# Patient Record
Sex: Male | Born: 1940 | Race: White | Hispanic: No | Marital: Married | Smoking: Former smoker
Health system: Southern US, Community
[De-identification: ages and names within clinical notes are randomized; demographics above are authoritative.]

## PROBLEM LIST (undated history)

## (undated) DIAGNOSIS — G4730 Sleep apnea, unspecified: Secondary | ICD-10-CM

## (undated) DIAGNOSIS — G4733 Obstructive sleep apnea (adult) (pediatric): Secondary | ICD-10-CM

## (undated) DIAGNOSIS — I214 Non-ST elevation (NSTEMI) myocardial infarction: Secondary | ICD-10-CM

## (undated) DIAGNOSIS — I2510 Atherosclerotic heart disease of native coronary artery without angina pectoris: Secondary | ICD-10-CM

## (undated) DIAGNOSIS — I209 Angina pectoris, unspecified: Secondary | ICD-10-CM

## (undated) DIAGNOSIS — R0600 Dyspnea, unspecified: Secondary | ICD-10-CM

## (undated) DIAGNOSIS — M1990 Unspecified osteoarthritis, unspecified site: Secondary | ICD-10-CM

## (undated) DIAGNOSIS — E785 Hyperlipidemia, unspecified: Secondary | ICD-10-CM

## (undated) DIAGNOSIS — F5101 Primary insomnia: Secondary | ICD-10-CM

## (undated) DIAGNOSIS — R1013 Epigastric pain: Secondary | ICD-10-CM

## (undated) DIAGNOSIS — M545 Low back pain: Secondary | ICD-10-CM

## (undated) DIAGNOSIS — M549 Dorsalgia, unspecified: Secondary | ICD-10-CM

## (undated) DIAGNOSIS — E669 Obesity, unspecified: Secondary | ICD-10-CM

## (undated) DIAGNOSIS — Z8719 Personal history of other diseases of the digestive system: Secondary | ICD-10-CM

## (undated) DIAGNOSIS — K469 Unspecified abdominal hernia without obstruction or gangrene: Secondary | ICD-10-CM

## (undated) DIAGNOSIS — E7800 Pure hypercholesterolemia, unspecified: Secondary | ICD-10-CM

## (undated) DIAGNOSIS — K219 Gastro-esophageal reflux disease without esophagitis: Secondary | ICD-10-CM

## (undated) DIAGNOSIS — N179 Acute kidney failure, unspecified: Secondary | ICD-10-CM

## (undated) DIAGNOSIS — R931 Abnormal findings on diagnostic imaging of heart and coronary circulation: Secondary | ICD-10-CM

## (undated) DIAGNOSIS — S37009A Unspecified injury of unspecified kidney, initial encounter: Secondary | ICD-10-CM

## (undated) DIAGNOSIS — Z9989 Dependence on other enabling machines and devices: Secondary | ICD-10-CM

## (undated) DIAGNOSIS — G8929 Other chronic pain: Secondary | ICD-10-CM

## (undated) DIAGNOSIS — I10 Essential (primary) hypertension: Secondary | ICD-10-CM

## (undated) DIAGNOSIS — M791 Myalgia, unspecified site: Secondary | ICD-10-CM

## (undated) HISTORY — DX: Epigastric pain: R10.13

## (undated) HISTORY — PX: KNEE ARTHROSCOPY: SUR90

## (undated) HISTORY — DX: Myalgia, unspecified site: M79.10

## (undated) HISTORY — DX: Unspecified osteoarthritis, unspecified site: M19.90

## (undated) HISTORY — DX: Non-ST elevation (NSTEMI) myocardial infarction: I21.4

## (undated) HISTORY — DX: Primary insomnia: F51.01

## (undated) HISTORY — DX: Obstructive sleep apnea (adult) (pediatric): G47.33

## (undated) HISTORY — DX: Obesity, unspecified: E66.9

## (undated) HISTORY — PX: CARPAL TUNNEL RELEASE: SHX101

## (undated) HISTORY — DX: Atherosclerotic heart disease of native coronary artery without angina pectoris: I25.10

## (undated) HISTORY — DX: Low back pain: M54.5

## (undated) HISTORY — DX: Unspecified injury of unspecified kidney, initial encounter: S37.009A

## (undated) HISTORY — PX: APPENDECTOMY: SHX54

## (undated) HISTORY — DX: Pure hypercholesterolemia, unspecified: E78.00

## (undated) HISTORY — DX: Essential (primary) hypertension: I10

## (undated) HISTORY — DX: Other chronic pain: G89.29

## (undated) HISTORY — DX: Sleep apnea, unspecified: G47.30

## (undated) HISTORY — DX: Acute kidney failure, unspecified: N17.9

---

## 2004-03-20 ENCOUNTER — Ambulatory Visit (HOSPITAL_BASED_OUTPATIENT_CLINIC_OR_DEPARTMENT_OTHER): Admission: RE | Admit: 2004-03-20 | Discharge: 2004-03-20 | Payer: Self-pay | Admitting: Orthopedic Surgery

## 2004-03-20 ENCOUNTER — Ambulatory Visit (HOSPITAL_COMMUNITY): Admission: RE | Admit: 2004-03-20 | Discharge: 2004-03-20 | Payer: Self-pay | Admitting: Orthopedic Surgery

## 2004-07-11 ENCOUNTER — Ambulatory Visit (HOSPITAL_BASED_OUTPATIENT_CLINIC_OR_DEPARTMENT_OTHER): Admission: RE | Admit: 2004-07-11 | Discharge: 2004-07-11 | Payer: Self-pay | Admitting: Internal Medicine

## 2004-07-11 ENCOUNTER — Encounter: Payer: Self-pay | Admitting: Internal Medicine

## 2005-05-31 ENCOUNTER — Encounter (INDEPENDENT_AMBULATORY_CARE_PROVIDER_SITE_OTHER): Payer: Self-pay | Admitting: Specialist

## 2005-05-31 ENCOUNTER — Ambulatory Visit (HOSPITAL_COMMUNITY): Admission: RE | Admit: 2005-05-31 | Discharge: 2005-05-31 | Payer: Self-pay | Admitting: *Deleted

## 2006-05-03 ENCOUNTER — Encounter: Admission: RE | Admit: 2006-05-03 | Discharge: 2006-05-03 | Payer: Self-pay | Admitting: Orthopedic Surgery

## 2006-06-17 ENCOUNTER — Encounter: Admission: RE | Admit: 2006-06-17 | Discharge: 2006-06-17 | Payer: Self-pay | Admitting: Orthopedic Surgery

## 2007-03-20 ENCOUNTER — Encounter: Admission: RE | Admit: 2007-03-20 | Discharge: 2007-03-20 | Payer: Self-pay | Admitting: Orthopedic Surgery

## 2007-04-02 ENCOUNTER — Encounter: Admission: RE | Admit: 2007-04-02 | Discharge: 2007-04-02 | Payer: Self-pay | Admitting: Orthopedic Surgery

## 2007-08-27 ENCOUNTER — Encounter: Admission: RE | Admit: 2007-08-27 | Discharge: 2007-08-27 | Payer: Self-pay | Admitting: Orthopedic Surgery

## 2008-06-17 ENCOUNTER — Encounter: Admission: RE | Admit: 2008-06-17 | Discharge: 2008-06-17 | Payer: Self-pay | Admitting: Orthopedic Surgery

## 2008-06-30 ENCOUNTER — Encounter: Admission: RE | Admit: 2008-06-30 | Discharge: 2008-06-30 | Payer: Self-pay | Admitting: Orthopedic Surgery

## 2008-11-17 ENCOUNTER — Encounter: Admission: RE | Admit: 2008-11-17 | Discharge: 2008-11-17 | Payer: Self-pay | Admitting: Orthopedic Surgery

## 2009-02-24 ENCOUNTER — Ambulatory Visit: Payer: Self-pay | Admitting: Internal Medicine

## 2009-02-24 DIAGNOSIS — G4730 Sleep apnea, unspecified: Secondary | ICD-10-CM

## 2009-02-24 HISTORY — DX: Sleep apnea, unspecified: G47.30

## 2009-08-31 ENCOUNTER — Encounter: Admission: RE | Admit: 2009-08-31 | Discharge: 2009-08-31 | Payer: Self-pay | Admitting: Orthopedic Surgery

## 2009-10-18 ENCOUNTER — Ambulatory Visit: Payer: Self-pay | Admitting: Internal Medicine

## 2009-10-18 DIAGNOSIS — R490 Dysphonia: Secondary | ICD-10-CM | POA: Insufficient documentation

## 2010-07-03 ENCOUNTER — Encounter: Admission: RE | Admit: 2010-07-03 | Discharge: 2010-07-03 | Payer: Self-pay | Admitting: Orthopedic Surgery

## 2010-12-17 ENCOUNTER — Encounter: Payer: Self-pay | Admitting: Orthopedic Surgery

## 2010-12-26 NOTE — Assessment & Plan Note (Signed)
Summary: sleep ap/former pt/trouble w/machine/apc   Primary Provider/Referring Provider:  Urgent Care  CC:  sleep new pt-former pt at [place] [facility]; having trouble with CPAP machine.Allen James  History of Present Illness: Current Problems: 34/1/10- 70 yoM formerly dx'x with sleep apnea. NPSG 07/11/04- RDI 15/hr. CPAP was adjusted to 9cwp. He has been very comforable with that. Machine is now wearing out and he needs replacement. He is not told he snores and he denies daytime sleepiness. He has been very compliant, using cpap every night.  Otherwise health has been good without routine medical problems. Bedtime 930- 1030 PM, sleep latency 20 min, waking 1-3 times before up at 7-730 AM. Weight up 35 lbs in 2 years     Preventive Screening-Counseling & Management     Smoking Status: quit  Current Medications (verified): 1)  Aleve 220 Mg Caps (Naproxen Sodium) .... Take As Directed As Needed Arthritis 2)  Cpap .... [facility]  Allergies (verified): No Known Drug Allergies  Past History:  Family History:    Mother0- died heart disease    Father -died COPD    Brother- had sleep apnea, died of a lung disorder- worked with benzene (02/24/2009)  Social History:    Patient states former smoker. 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  Past Medical History:    Sleep Apnea  Past Surgical History:    Right knee surgery    Bilateral carpal tunnel  Family History:    Reviewed history and no changes required:       Mother- died  heart disease       Father -died COPD       Brother- had sleep apnea, died of a lung disorder- worked with benzene  Social History:    Reviewed history and no changes required:       Patient states former smoker. 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  The patient denies shortness of breath at rest, productive cough, non-productive cough, coughing up blood, chest pain, irregular heartbeats, indigestion, loss of appetite, weight change, abdominal pain, difficulty swallowing, sore throat, tooth/dental problems, headaches, nasal congestion/difficulty breathing through nose, sneezing, itching, ear ache, anxiety, depression, hand/feet swelling, joint stiffness or pain, rash, change in color of mucus, and fever.    Vital Signs:  Patient profile:   70 year old male Height:      73 inches Weight:      277.38 pounds BMI:     36.73 Pulse rate:   75 / minute BP sitting:   108 / 56  (left arm) Cuff size:   large  Vitals Entered By: Reynaldo Minium CMA (February 24, 2009 4:01 PM)  O2 Sat on room air at rest %:  98 CC: sleep new pt-former pt at [place] [facility]; having trouble with CPAP machine. Comments Medications reviewed with patient Reynaldo Minium CMA  February 24, 2009 4:03 PM    Physical Exam  Additional Exam:  General: A/Ox3; pleasant and cooperative, NAD, overweight SKIN: no rash, lesions NODES: no lymphadenopathy HEENT: /AT, EOM- WNL, Conjuctivae- clear, PERRLA, TM-WNL, Nose- clear, Throat- clear and wnl, Melampatti II-III NECK: Supple w/ fair ROM, JVD- none, normal carotid impulses w/o bruits Thyroid- normal to palpation CHEST: Clear to P&A HEART: RRR, no m/g/r heard ABDOMEN: Soft and nl; nml bowel sounds; no organomegaly or masses noted AVW:UJWJ, nl pulses, no edema  NEURO: Grossly intact to observation      Impression &  Recommendations:  Problem # 1:  SLEEP APNEA (ICD-780.57)  Excellent control and compliance. Would help to lose weight. We will continue cpap at 9 and get replacement for worn out machnie.  Complete Medication List: 1)  Aleve 220 Mg Caps (Naproxen sodium) .... Take as directed as needed arthritis 2)  Cpap 9  .... [facility]  Other Orders: New Patient Level III (19147) DME Referral (DME)  Patient Instructions: 1)  Please schedule a follow-up appointment in 1 year. 2)  We will send cpap replacement info to [facility] and continue pressure a 9 cwp.

## 2010-12-26 NOTE — Assessment & Plan Note (Signed)
Summary: ROV//MBW   Primary Provider/Referring Provider:  Urgent Care  CC:  follow up visit-"hot flashes" and tightness in throat and losing voice x 6 weeks. Allen James  History of Present Illness: Current Problems: 34/1/10- 67 yoM formerly dx'x with sleep apnea. NPSG 07/11/04- RDI 15/hr. CPAP was adjusted to 9cwp. He has been very comforable with that. Machine is now wearing out and he needs replacement. He is not told he snores and he denies daytime sleepiness. He has been very compliant, using cpap every night.  Otherwise health has been good without routine medical problems. Bedtime 930- 1030 PM, sleep latency 20 min, waking 1-3 times before up at 7-730 AM. Weight up 35 lbs in 2 years  October 18, 2009- OSA Doing well since CPAP machine replaced- set on 9 ([facility]). Machine seems fine. Now in past 5-6weeks wife concerned about his change of voice. Raspy and feels tight in throat without postnasal drip. Hot sweaty spells. No fever. No dysphagia  or sore throat. Can't clear throat. Chest feels ok. Heartburn is controlled with otc pills.    Current Medications (verified): 1)  Aleve 220 Mg Caps (Naproxen Sodium) .... Take As Directed As Needed Arthritis 2)  Cpap 9 .... [facility]  Allergies (verified): No Known Drug Allergies  Past History:  Past Medical History: Last updated: 03-15-2009 Sleep Apnea  Past Surgical History: Last updated: 03-15-2009 Right knee surgery Bilateral carpal tunnel  Family History: Last updated: 2009-03-15 Mother- died heart disease Father -died COPD Brother- had sleep apnea, died of a lung disorder- worked with benzene  Social History: Last updated: 03-15-2009 Patient states former smoker. 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  The patient denies anorexia, fever, weight loss, weight gain, vision loss, decreased  hearing, chest pain, syncope, dyspnea on exertion, peripheral edema, prolonged cough, headaches, hemoptysis, abdominal pain, and severe indigestion/heartburn.    Vital Signs:  Patient profile:   70 year old male Height:      73 inches Weight:      264.50 pounds BMI:     35.02 O2 Sat:      97 % on Room air Pulse rate:   87 / minute BP sitting:   130 / 72  (left arm) Cuff size:   regular  Vitals Entered By: Reynaldo Minium CMA (October 18, 2009 3:31 PM)  O2 Flow:  Room air  Physical Exam  Additional Exam:  General: A/Ox3; pleasant and cooperative, NAD, overweight SKIN: no rash, lesions NODES: no lymphadenopathy HEENT: [place]/AT, EOM- WNL, Conjuctivae- clear, PERRLA, TM-WNL, Nose- clear, Throat- clear and wnl, Melampatti II-III, hoarse without stridor NECK: Supple w/ fair ROM, JVD- none, normal carotid impulses w/o bruits Thyroid- normal to palpation CHEST: Clear to P&A HEART: RRR, no m/g/r heard ABDOMEN: Soft and nl;  ZOX:WRUE, nl pulses, no edema  NEURO: Grossly intact to observation      Impression & Recommendations:  Problem # 1:  SLEEP APNEA (ICD-780.57)  Great compliance and control  Problem # 2:  HOARSENESS (ICD-784.42)  Nonspecific hoarseness, persistent x 5 weeks in a former smoker. Need Laryngoscopy . Will set up ENT referral. Consider reflux or indoor heat but he denies obvious waterbrash and he is using cpap humidifier.   Other Orders: Est. Patient Level II (45409) ENT Referral (ENT)  Patient Instructions: 1)  Please schedule a follow-up appointment in 6 months.for  follow up of sleep apnea- earleir if needed 2)  See [facility] for ENT referral

## 2011-04-13 NOTE — Op Note (Signed)
NAME:  Allen James, Allen James                        ACCOUNT NO.:  [id number]   MEDICAL RECORD NO.:  [id number]                   PATIENT TYPE:  AMB   LOCATION:  DSC                                  FACILITY:  [facility]   PHYSICIAN:  John L. Rendall III, M.D.           DATE OF BIRTH:  19-Dec-1940   DATE OF PROCEDURE:  03/20/2004  DATE OF DISCHARGE:                                 OPERATIVE REPORT   PREOPERATIVE DIAGNOSIS:  Torn medial meniscus, left knee.   POSTOPERATIVE DIAGNOSIS:  Complex flap tear lateral meniscus plus  osteoarthritis medial femoral condyle and patella.   OPERATION PERFORMED:  Chondroplasty medial femoral condyle and patella plus  lateral meniscectomy.   SURGEON:  John L. Rendall, M.D.   ANESTHESIA:  MAC.   INDICATIONS FOR PROCEDURE:  Chronic medial left knee pain with crepitation,  grinding and swelling.   JUSTIFICATION FOR OUTPATIENT SETTING:  Inpatient not required and chronic  symptoms are resistant to conservative measures.   DESCRIPTION OF PROCEDURE:  Under MAC anesthesia, the left knee was prepared  with Betadine and draped as a sterile field with a legholder.  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  Using a curved Merlin shaver, a chondroplasty medial femoral condyle was  done.  A fine rasp was used to smooth the remaining fibrillation and then it  was touched up with the shaver.  Medial meniscus was intact and untorn.  In  the lateral compartment however, there was a large flap tear flipped into  the intercondylar notch.  It was resected with a combination of basket  forceps and intra-articular shaver and the meniscal rim was carefully  trimmed.  Local chondromalacia on the femoral condyle was shaved.  The  remainder of the knee was then carefully examined.  Some pieces  of floating  hyaline cartilage were washed out of the knee. The knee was then infiltrated  with Marcaine with morphine with epinephrine and a sterile compressive  bandage was applied.  The patient returned to recovery in good condition.                                               John L. Dorothyann Gibbs, M.D.    Renato Gails  D:  03/20/2004  T:  03/20/2004  Job:  [id number]

## 2011-04-13 NOTE — Procedures (Signed)
NAME:  Allen James, Allen James            ACCOUNT NO.:  [id number]   MEDICAL RECORD NO.:  [id number]          PATIENT TYPE:  OUT   LOCATION:  SLEEP CENTER                 [facility]ACILITY[facility]   PHYSICIAN:  Clinton D. Maple Hudson, M.D. DATE OF BIRTH:  12/20/1940   DATE OF ADMISSION:  07/11/2004  DATE OF DISCHARGE:  07/11/2004                              NOCTURNAL POLYSOMNOGRAM   REFERRING PHYSICIAN:  Clinton D. Maple Hudson, M.D.   INDICATION FOR STUDY AND HISTORY:  1. Hypersomnia with sleep apnea.   Epworth sleepiness score 154, BMI 35, weight 260 pounds.   MEDICATIONS:  Advil, Maalox.   SLEEP ARCHITECTURE:  Short total sleep time 281 minutes with sleep  efficiency of 53%.  Stage I was 21%; stage II, 59%; Stages III and IV 1%.  REM was 19% of total sleep time.  Latency to sleep onset 59 minutes, latency  to REM 211 minutes.  Awake after sleep onset 195 minutes.  Arousal index 18  per hour.   RESPIRATORY DATA:  Split study protocol.  Mild obstructive sleep  apnea/hypopnea syndrome, RDI 15 per hour before CPAP.  This included 6  obstructive apneas and 32 hypopneas.  The events were not positional.  CPAP  was titrated to 9 CWP.  Best pressure was actually 5 CWP,  RDI of 0 per  hour, using a standard size Fisher Paykel Acclaim 407 mask with heated  humidifier.   OXYGEN DATA:  Moderate snoring with mild oxygen desaturation to 85%.with  apneas, corrected to 92% on CPAP, breathing room air.  Baseline room air  oxygen saturation while awake was 92%.   CARDIAC DATA:  Normal sinus rhythm.   MOVEMENT AND PARASOMNIA:  There were 32 limb jerks recorded of which 14 were  associated with arousal or awakening for a periodic limb movement with  arousal index of 3 per hour.   IMPRESSION AND RECOMMENDATIONS:  Mild obstructive sleep apnea/hypopnea  syndrome, RDI 15 per hour/  1. Adequate control; with CPAP 5 CWP, RDI 0.  2. Mild oxygen desaturation corrected by CPAP.    Clinton D. Maple Hudson, M.D.  Diplomate,  Biomedical engineer of Sleep Medicine                                   ______________________________                                Rennis Chris. Maple Hudson, M.D.                                Diplomate, American Board of Sleep Medicine    CDY/MEDQ  D:  07/16/2004 11:59:10  T:  07/16/2004 23:45:21  Job:  [id number]

## 2011-05-11 ENCOUNTER — Other Ambulatory Visit: Payer: Self-pay | Admitting: Family Medicine

## 2011-05-11 DIAGNOSIS — M545 Low back pain, unspecified: Secondary | ICD-10-CM

## 2011-05-18 ENCOUNTER — Ambulatory Visit
Admission: RE | Admit: 2011-05-18 | Discharge: 2011-05-18 | Disposition: A | Discharge status: Home / Self Care | Payer: Medicare Other | Source: Ambulatory Visit | Attending: Family Medicine | Admitting: Family Medicine

## 2011-05-18 DIAGNOSIS — M545 Low back pain, unspecified: Secondary | ICD-10-CM

## 2014-04-12 ENCOUNTER — Emergency Department (HOSPITAL_COMMUNITY): Payer: Medicare HMO

## 2014-04-12 ENCOUNTER — Inpatient Hospital Stay (HOSPITAL_COMMUNITY)
Admission: EM | Admit: 2014-04-12 | Discharge: 2014-04-15 | DRG: 247 | Disposition: A | Discharge status: Home / Self Care | Payer: Medicare HMO | Attending: Cardiology | Admitting: Cardiology

## 2014-04-12 ENCOUNTER — Encounter (HOSPITAL_COMMUNITY): Payer: Self-pay | Admitting: Emergency Medicine

## 2014-04-12 ENCOUNTER — Inpatient Hospital Stay (HOSPITAL_COMMUNITY): Payer: Medicare HMO

## 2014-04-12 ENCOUNTER — Encounter (HOSPITAL_COMMUNITY)
Admission: EM | Disposition: A | Discharge status: Home / Self Care | Payer: Medicare HMO | Source: Home / Self Care | Attending: Cardiology

## 2014-04-12 ENCOUNTER — Ambulatory Visit (HOSPITAL_COMMUNITY): Admit: 2014-04-12 | Payer: Self-pay | Admitting: Cardiovascular Disease

## 2014-04-12 DIAGNOSIS — Z955 Presence of coronary angioplasty implant and graft: Secondary | ICD-10-CM

## 2014-04-12 DIAGNOSIS — E669 Obesity, unspecified: Secondary | ICD-10-CM | POA: Diagnosis present

## 2014-04-12 DIAGNOSIS — N179 Acute kidney failure, unspecified: Secondary | ICD-10-CM | POA: Diagnosis present

## 2014-04-12 DIAGNOSIS — Z9989 Dependence on other enabling machines and devices: Secondary | ICD-10-CM

## 2014-04-12 DIAGNOSIS — I214 Non-ST elevation (NSTEMI) myocardial infarction: Secondary | ICD-10-CM | POA: Insufficient documentation

## 2014-04-12 DIAGNOSIS — R079 Chest pain, unspecified: Secondary | ICD-10-CM

## 2014-04-12 DIAGNOSIS — E785 Hyperlipidemia, unspecified: Secondary | ICD-10-CM

## 2014-04-12 DIAGNOSIS — G4733 Obstructive sleep apnea (adult) (pediatric): Secondary | ICD-10-CM

## 2014-04-12 DIAGNOSIS — N183 Chronic kidney disease, stage 3 unspecified: Secondary | ICD-10-CM

## 2014-04-12 DIAGNOSIS — I2510 Atherosclerotic heart disease of native coronary artery without angina pectoris: Secondary | ICD-10-CM | POA: Diagnosis present

## 2014-04-12 DIAGNOSIS — Z6833 Body mass index (BMI) 33.0-33.9, adult: Secondary | ICD-10-CM

## 2014-04-12 DIAGNOSIS — Z7982 Long term (current) use of aspirin: Secondary | ICD-10-CM

## 2014-04-12 HISTORY — DX: Hyperlipidemia, unspecified: E78.5

## 2014-04-12 HISTORY — DX: Obstructive sleep apnea (adult) (pediatric): G47.33

## 2014-04-12 HISTORY — DX: Dorsalgia, unspecified: M54.9

## 2014-04-12 HISTORY — PX: LEFT HEART CATHETERIZATION WITH CORONARY ANGIOGRAM: SHX5451

## 2014-04-12 HISTORY — DX: Dependence on other enabling machines and devices: Z99.89

## 2014-04-12 HISTORY — DX: Gastro-esophageal reflux disease without esophagitis: K21.9

## 2014-04-12 HISTORY — DX: Atherosclerotic heart disease of native coronary artery without angina pectoris: I25.10

## 2014-04-12 HISTORY — DX: Unspecified osteoarthritis, unspecified site: M19.90

## 2014-04-12 HISTORY — DX: Angina pectoris, unspecified: I20.9

## 2014-04-12 HISTORY — PX: CORONARY ANGIOPLASTY: SHX604

## 2014-04-12 HISTORY — PX: PERCUTANEOUS CORONARY STENT INTERVENTION (PCI-S): SHX5485

## 2014-04-12 HISTORY — DX: Personal history of other diseases of the digestive system: Z87.19

## 2014-04-12 HISTORY — DX: Non-ST elevation (NSTEMI) myocardial infarction: I21.4

## 2014-04-12 HISTORY — DX: Abnormal findings on diagnostic imaging of heart and coronary circulation: R93.1

## 2014-04-12 LAB — I-STAT CHEM 8, ED
BUN: 19 mg/dL (ref 6–23)
Calcium, Ion: 1.14 mmol/L (ref 1.13–1.30)
Chloride: 107 mEq/L (ref 96–112)
Creatinine, Ser: 1.6 mg/dL — ABNORMAL HIGH (ref 0.50–1.35)
Glucose, Bld: 129 mg/dL — ABNORMAL HIGH (ref 70–99)
HCT: 49 % (ref 39.0–52.0)
Hemoglobin: 16.7 g/dL (ref 13.0–17.0)
Potassium: 4.3 mEq/L (ref 3.7–5.3)
Sodium: 142 mEq/L (ref 137–147)
TCO2: 25 mmol/L (ref 0–100)

## 2014-04-12 LAB — POCT ACTIVATED CLOTTING TIME: Activated Clotting Time: 359 seconds

## 2014-04-12 LAB — CBC
HCT: 45.2 % (ref 39.0–52.0)
Hemoglobin: 15.6 g/dL (ref 13.0–17.0)
MCH: 32.7 pg (ref 26.0–34.0)
MCHC: 34.5 g/dL (ref 30.0–36.0)
MCV: 94.8 fL (ref 78.0–100.0)
Platelets: 208 10*3/uL (ref 150–400)
RBC: 4.77 MIL/uL (ref 4.22–5.81)
RDW: 12.5 % (ref 11.5–15.5)
WBC: 6.5 10*3/uL (ref 4.0–10.5)

## 2014-04-12 LAB — TROPONIN I
Troponin I: <0.3 ng/mL (ref <0.30)
Troponin I: 3.48 ng/mL (ref <0.30)

## 2014-04-12 LAB — I-STAT TROPONIN, ED: Troponin i, poc: 0.02 ng/mL (ref 0.00–0.08)

## 2014-04-12 SURGERY — LEFT HEART CATHETERIZATION WITH CORONARY ANGIOGRAM
Anesthesia: LOCAL

## 2014-04-12 MED ORDER — HEPARIN (PORCINE) IN NACL 2-0.9 UNIT/ML-% IJ SOLN
INTRAMUSCULAR | Status: AC
  Filled 2014-04-12: qty 1000

## 2014-04-12 MED ORDER — TICAGRELOR 90 MG PO TABS
90.0000 mg | ORAL_TABLET | Freq: Two times a day (BID) | ORAL | Status: DC
  Administered 2014-04-12 – 2014-04-15 (×6): 90 mg via ORAL
  Filled 2014-04-12 (×8): qty 1

## 2014-04-12 MED ORDER — SODIUM CHLORIDE 0.9 % IJ SOLN: 3.0000 mL | INTRAMUSCULAR | Status: DC | PRN

## 2014-04-12 MED ORDER — SODIUM CHLORIDE 0.9 % IV SOLN: 250.0000 mL | INTRAVENOUS | Status: DC | PRN

## 2014-04-12 MED ORDER — TRAMADOL HCL 50 MG PO TABS
100.0000 mg | ORAL_TABLET | Freq: Three times a day (TID) | ORAL | Status: DC
  Administered 2014-04-12 – 2014-04-15 (×8): 100 mg via ORAL
  Filled 2014-04-12 (×8): qty 2

## 2014-04-12 MED ORDER — HEPARIN SODIUM (PORCINE) 1000 UNIT/ML IJ SOLN
INTRAMUSCULAR | Status: AC
  Filled 2014-04-12: qty 1

## 2014-04-12 MED ORDER — MIDAZOLAM HCL 2 MG/2ML IJ SOLN
INTRAMUSCULAR | Status: AC
Start: 2014-04-12 — End: 2014-04-12
  Filled 2014-04-12: qty 2

## 2014-04-12 MED ORDER — SODIUM CHLORIDE 0.9 % IV SOLN
1.7500 mg/kg/h | INTRAVENOUS | Status: DC
  Administered 2014-04-12: 1.75 mg/kg/h via INTRAVENOUS
  Filled 2014-04-12: qty 250

## 2014-04-12 MED ORDER — TICAGRELOR 90 MG PO TABS
ORAL_TABLET | ORAL | Status: AC
  Filled 2014-04-12: qty 2

## 2014-04-12 MED ORDER — SODIUM CHLORIDE 0.9 % IV SOLN: 1.0000 mL/kg/h | INTRAVENOUS | Status: AC

## 2014-04-12 MED ORDER — ASPIRIN 81 MG PO CHEW
324.0000 mg | CHEWABLE_TABLET | Freq: Once | ORAL | Status: AC
  Administered 2014-04-12: 324 mg via ORAL
  Filled 2014-04-12: qty 4

## 2014-04-12 MED ORDER — MIDAZOLAM HCL 2 MG/2ML IJ SOLN
INTRAMUSCULAR | Status: AC
  Filled 2014-04-12: qty 2

## 2014-04-12 MED ORDER — OXYCODONE-ACETAMINOPHEN 5-325 MG PO TABS: 1.0000 | ORAL_TABLET | ORAL | Status: DC | PRN

## 2014-04-12 MED ORDER — HEPARIN SODIUM (PORCINE) 5000 UNIT/ML IJ SOLN
4000.0000 [IU] | Freq: Once | INTRAMUSCULAR | Status: AC
  Administered 2014-04-12: 4000 [IU] via INTRAVENOUS
  Filled 2014-04-12: qty 1

## 2014-04-12 MED ORDER — VERAPAMIL HCL 2.5 MG/ML IV SOLN
INTRAVENOUS | Status: AC
  Filled 2014-04-12: qty 2

## 2014-04-12 MED ORDER — ASPIRIN 81 MG PO CHEW
81.0000 mg | CHEWABLE_TABLET | Freq: Every day | ORAL | Status: DC
  Administered 2014-04-13 – 2014-04-15 (×3): 81 mg via ORAL
  Filled 2014-04-12 (×3): qty 1

## 2014-04-12 MED ORDER — NITROGLYCERIN 0.4 MG SL SUBL: 0.4000 mg | SUBLINGUAL_TABLET | SUBLINGUAL | Status: DC | PRN

## 2014-04-12 MED ORDER — HEPARIN SODIUM (PORCINE) 5000 UNIT/ML IJ SOLN
5000.0000 [IU] | Freq: Three times a day (TID) | INTRAMUSCULAR | Status: DC
  Administered 2014-04-13 – 2014-04-14 (×4): 5000 [IU] via SUBCUTANEOUS
  Filled 2014-04-12 (×7): qty 1

## 2014-04-12 MED ORDER — MORPHINE SULFATE 4 MG/ML IJ SOLN
4.0000 mg | INTRAMUSCULAR | Status: DC | PRN
  Administered 2014-04-12: 4 mg via INTRAVENOUS
  Filled 2014-04-12: qty 1

## 2014-04-12 MED ORDER — ATORVASTATIN CALCIUM 80 MG PO TABS
80.0000 mg | ORAL_TABLET | Freq: Every day | ORAL | Status: DC
  Administered 2014-04-13 – 2014-04-14 (×2): 80 mg via ORAL
  Filled 2014-04-12 (×3): qty 1

## 2014-04-12 MED ORDER — MORPHINE SULFATE 4 MG/ML IJ SOLN: 4.0000 mg | INTRAMUSCULAR | Status: DC | PRN

## 2014-04-12 MED ORDER — HYDROMORPHONE HCL PF 1 MG/ML IJ SOLN
INTRAMUSCULAR | Status: AC
  Filled 2014-04-12: qty 1

## 2014-04-12 MED ORDER — FENTANYL CITRATE 0.05 MG/ML IJ SOLN
INTRAMUSCULAR | Status: AC
  Filled 2014-04-12: qty 2

## 2014-04-12 MED ORDER — BIVALIRUDIN 250 MG IV SOLR
INTRAVENOUS | Status: AC
  Filled 2014-04-12: qty 250

## 2014-04-12 MED ORDER — SODIUM CHLORIDE 0.9 % IJ SOLN
3.0000 mL | Freq: Two times a day (BID) | INTRAMUSCULAR | Status: DC
Start: 2014-04-13 — End: 2014-04-13

## 2014-04-12 MED ORDER — NITROGLYCERIN 0.4 MG SL SUBL
0.4000 mg | SUBLINGUAL_TABLET | SUBLINGUAL | Status: DC | PRN
  Filled 2014-04-12: qty 1

## 2014-04-12 MED ORDER — METOPROLOL TARTRATE 12.5 MG HALF TABLET
12.5000 mg | ORAL_TABLET | Freq: Two times a day (BID) | ORAL | Status: DC
  Administered 2014-04-12 – 2014-04-15 (×6): 12.5 mg via ORAL
  Filled 2014-04-12 (×8): qty 1

## 2014-04-12 MED ORDER — LIDOCAINE HCL (PF) 1 % IJ SOLN
INTRAMUSCULAR | Status: AC
  Filled 2014-04-12: qty 30

## 2014-04-12 NOTE — ED Notes (Signed)
Cardiology MD at bedside. Receiving consent for cath. zoll pads on pt.

## 2014-04-12 NOTE — ED Notes (Signed)
Pt reporting cp x 20 mins, started vomiting. Pt reports he was playing golf, when this happened. Pt reports became very sweaty. Pt shirt and pants are soaked at current. Pt is a x 4. Reporting 6/10 central CP. Denies cardiac hx. Is clammy.

## 2014-04-12 NOTE — CV Procedure (Signed)
Cardiac Catheterization Procedure Note  Name: Allen James MRN: [id number] DOB: February 20, 1941  Procedure: Catheter placement for coronary angiography, Selective Coronary Angiography, PTCA and stenting of the native RCA  Indication: NSTEMI (aborted inferolateral STEMI with transient ST elevation).   Procedural Details:  The right wrist was prepped, draped, and anesthetized with 1% lidocaine. Using the modified Seldinger technique, a 5/6 French sheath was introduced into the right radial artery. 3 mg of verapamil was administered through the sheath and Angiomax was administered intravenously. The right subclavian cross the midline and entered the aorta on the left side, making it very difficult to pass wires or catheters into the ascending aorta. I was able to advance an angled glide wire through a 5 [place] JL 3.5 cm diagnostic catheter into the ascending aorta and this was exchanged out over an exchange length wire for a 5 [place] EBU guide catheter used to image the left coronary artery. Multiple catheters were used for the right coronary artery, but it was ultimately cannulated with a JR 4 guide catheter.  PROCEDURAL FINDINGS Hemodynamics: AO 111/68    Coronary angiography: Coronary dominance: right  Left mainstem: Widely patent without obstructive disease. Divides into the LAD and left circumflex the  Left anterior descending (LAD): The LAD is moderately calcified. There is diffuse 30-40% proximal LAD stenosis. The mid vessel is patent with nonobstructive stenosis of approximately 40% just after the second diagonal branch. The first diagonal has 75% stenosis in its distal portion. The second diagonal is small and has diffuse 70% stenosis. The distal LAD has mild diffuse disease and wraps the LV apex.  Left circumflex (LCx): The proximal circumflex is patent. This is a large caliber vessel. The first obtuse marginal branch has mild diffuse disease in its proximal aspect followed by a  napkin ring 90-95% stenosis in its midportion. The AV circumflex in the mid and distal portions are patent and the left posterolateral branch is patent.  Right coronary artery (RCA): This is a large, dominant vessel. The proximal vessel has 95% eccentric stenosis. The distal vessel has 95% hypodense stenosis. There is some ectasia in the intervening portions of the vessel. The PDA and PLA branches are patent with TIMI-3 flow.  Left ventriculography: Deferred  PCI Note:  Following the diagnostic procedure, the decision was made to proceed with PCI. It was difficult to get a supportive guide catheter in position. Ultimately a hockey-stick guide was utilized.   A cougar coronary guidewire was used to cross the lesion.  The lesion was predilated with a 2.5 mm balloon.  The proximal and distal vessel were both heavily diseased. Both lesions were stented with a 3.5 x 28 mm Xience drug-eluting stent distally and a 3.5x38 mm Xience Alpine DES proximally.  The stents were deployed in overlapping fashion. The stents were postdilated with a 4.0 mm noncompliant balloon.  Following PCI, there was 0% residual stenosis and TIMI-3 flow at both lesion sites. Final angiography confirmed an excellent result. The patient tolerated the procedure well. There were no immediate procedural complications. A TR band was used for radial hemostasis. The patient was transferred to the post catheterization recovery area for further monitoring.  PCI Data: Lesion 1 Vessel - RCA/Segment - proximal Percent Stenosis (pre)  95 TIMI-flow 3 Stent 3.5x38 mm Xience Alpine DES Percent Stenosis (post) 0 TIMI-flow (post) 3  Lesion 2 Vessel - RCA/Segment - distal Percent Stenosis (pre)  95 TIMI-flow 3 Stent 3.5x28 mm Xience DES Percent Stenosis (post) 0 TIMI-flow (post) 3  Final Conclusions:   1. 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  Patent LAD with diffuse nonobstructive disease    Recommendations:   Check Echo for assessment of LV function  Staged PCI of OM branch Wednesday pending assessment of patient's clinical stability and LV function  WOULD NOT USE RIGHT RADIAL ACCESS FOR FUTURE CATH OR PCI PROCEDURES  Sherren Mocha 04/12/2014, 6:09 PM

## 2014-04-12 NOTE — H&P (Signed)
History and Physical  Patient ID: Allen James MRN: [id number], SOB: 01-16-1941 73 y.o. Date of Encounter: 04/12/2014, 6:05 PM  Primary Physician: No primary provider on file. Primary Cardiologist: new  Chief Complaint: Chest pain  HPI: 73 y.o. male w/ PMHx significant for no major medical problems who presented to [facility] on 04/12/2014 with complaints of chest pain. The patient was playing golf this afternoon when he developed substernal chest pain and diaphoresis. He describes this as a pressure. There was no associated shortness of breath, lightheadedness, palpitations, or weakness. He had a similar episode approximately 48 hours ago that resolved spontaneously after 30 minutes. He did not seek medical attention at that time. He has no previous history of similar symptoms. He has no known history of cardiac disease. He has no history of hypertension, diabetes, or dyslipidemia.  He presented by private vehicle to the emergency department. His initial EKG was suggestive of inferolateral injury. However, a followup EKG shortly after the initial tracing showed resolution of his ST changes. He is currently chest pain-free.  Past Medical History  Diagnosis Date  . Back pain      Surgical History:  Past Surgical History  Procedure Laterality Date  . Appendectomy      Remote     Home Meds: Prior to Admission medications   Not on File    Allergies: No Known Allergies  History   Social History  . Marital Status: Married    Spouse Name: N/A    Number of Children: N/A  . Years of Education: N/A   Occupational History  . Not on file.   Social History Main Topics  . Smoking status: Never Smoker   . Smokeless tobacco: Not on file  . Alcohol Use: No  . Drug Use: Not on file  . Sexual Activity: Not on file   Other Topics Concern  . Not on file   Social History Narrative  . No narrative on file     Family History  Problem Relation Age of Onset  .  Coronary artery disease Mother 41    Review of Systems: General: negative for chills, fever, night sweats or weight changes.  ENT: negative for rhinorrhea or epistaxis Cardiovascular: See history of present illness Dermatological: negative for rash Respiratory: negative for cough or wheezing GI: negative for nausea, vomiting, diarrhea, bright red blood per rectum, melena, or hematemesis GU: no hematuria, urgency, or frequency Neurologic: negative for visual changes, syncope, headache, or dizziness Heme: no easy bruising or bleeding Endo: negative for excessive thirst, thyroid disorder, or flushing Musculoskeletal: negative for joint pain or swelling, negative for myalgias All other systems reviewed and are otherwise negative except as noted above.  Physical Exam: Blood pressure 139/78, pulse 90, temperature 97.7 F (36.5 C), temperature source Oral, resp. rate 26, weight 112.946 kg (249 lb), SpO2 100.00%. General: Well developed, well nourished, alert and oriented, pleasant obese male in no acute distress. HEENT: Normocephalic, atraumatic, sclera non-icteric, no xanthomas, nares are without discharge.  Neck: Supple. Carotids 2+ without bruits. JVP normal Lungs: Clear bilaterally to auscultation without wheezes, rales, or rhonchi. Breathing is unlabored. Heart: RRR with normal S1 and S2. No murmurs, rubs, or gallops appreciated. Abdomen: Soft, non-tender, non-distended with normoactive bowel sounds. No hepatomegaly. No rebound/guarding. No obvious abdominal masses. The abdomen is obese Back: No CVA tenderness Msk:  Strength and tone appear normal for age. Extremities: No clubbing, cyanosis, or edema.  Distal pedal pulses are 2+ and equal  bilaterally. Neuro: CNII-XII intact, moves all extremities spontaneously. Psych:  Responds to questions appropriately with a normal affect.   Labs:   Lab Results  Component Value Date   WBC 6.5 04/12/2014   HGB 16.7 04/12/2014   HCT 49.0 04/12/2014     MCV 94.8 04/12/2014   PLT 208 04/12/2014     Recent Labs Lab 04/12/14 1521  NA 142  K 4.3  CL 107  BUN 19  CREATININE 1.60*  GLUCOSE 129*    Recent Labs  04/12/14 1505  TROPONINI <0.30   No results found for this basename: CHOL,  HDL,  LDLCALC,  TRIG   No results found for this basename: DDIMER    Radiology/Studies:  No results found.   EKG: Initial tracing shows sinus rhythm with acute inferolateral injury. Followup tracing at 3:10 PM shows sinus rhythm with nonspecific ST changes to  ASSESSMENT AND PLAN:  This is a 73 year old gentleman with acute coronary syndrome. The patient has transient inferolateral ST segment elevation now resolved, consistent with an aborted inferior wall STEMI. Will treat him for ACS with aspirin and heparin. The patient will be transported emergently to the cardiac catheterization lab for cath and possible PCI. I have reviewed the risks, indications, and alternatives with the patient and his wife. They understand and agree to proceed.  Will start a high dose statin and check lipids tomorrow morning. Will initiate a low-dose beta blocker. Other plans pending the results of his heart catheterization.  Allen James  04/12/2014, 6:05 PM

## 2014-04-12 NOTE — ED Provider Notes (Signed)
CSN: [id number]     Arrival date & time 04/12/14  1452 History   First MD Initiated Contact with Patient 04/12/14 1500     Chief Complaint  Patient presents with  . Chest Pain     (Consider location/radiation/quality/duration/timing/severity/associated sxs/prior Treatment) HPI Comments: 73 year old male with no significant medical history very healthy for his age presents with chest tightness starting 45 minutes prior to arrival while playing golf. Patient developed diaphoresis and nausea soon afterward. Pain is mildly improved since with no treatment. Patient patient does try to take a baby aspirin a day. No known cardiac or blood clot history. No recent surgeries or leg swelling or leg pain. Chest tightness is nonradiating, anterior. Patient does not have a cardiologist. Nothing specific worsens or improves however patient was exerting himself when it started.  Patient is a 73 y.o. male presenting with chest pain. The history is provided by the patient.  Chest Pain Associated symptoms: diaphoresis   Associated symptoms: no abdominal pain, no back pain, no cough, no fever, no headache, no shortness of breath and not vomiting     Past Medical History  Diagnosis Date  . Back pain    History reviewed. No pertinent past surgical history. No family history on file. History  Substance Use Topics  . Smoking status: Never Smoker   . Smokeless tobacco: Not on file  . Alcohol Use: No    Review of Systems  Constitutional: Positive for diaphoresis. Negative for fever and chills.  HENT: Negative for congestion.   Eyes: Negative for visual disturbance.  Respiratory: Positive for chest tightness. Negative for cough and shortness of breath.   Cardiovascular: Positive for chest pain.  Gastrointestinal: Negative for vomiting and abdominal pain.  Genitourinary: Negative for dysuria and flank pain.  Musculoskeletal: Negative for back pain, neck pain and neck stiffness.  Skin: Negative for rash.   Neurological: Negative for light-headedness and headaches.      Allergies  Review of patient's allergies indicates no known allergies.  Home Medications   Prior to Admission medications   Not on File   BP 139/78  Pulse 83  Temp(Src) 97.7 F (36.5 C) (Oral)  Resp 20  Wt 249 lb (112.946 kg)  SpO2 99% Physical Exam  Nursing note and vitals reviewed. Constitutional: He is oriented to person, place, and time. He appears well-developed and well-nourished.  HENT:  Head: Normocephalic and atraumatic.  Eyes: Conjunctivae are normal. Right eye exhibits no discharge. Left eye exhibits no discharge.  Neck: Normal range of motion. Neck supple. No tracheal deviation present.  Cardiovascular: Normal rate and regular rhythm.   Pulmonary/Chest: Effort normal and breath sounds normal.  Abdominal: Soft. He exhibits no distension. There is no tenderness. There is no guarding.  Musculoskeletal: He exhibits no edema and no tenderness.  Neurological: He is alert and oriented to person, place, and time.  Skin: Skin is warm. No rash noted. He is diaphoretic.  Mild diaphoresis  Psychiatric: He has a normal mood and affect.    ED Course  Procedures (including critical care time) CRITICAL CARE Performed by: Mariea Clonts   Total critical care time: 30 min  Critical care time was exclusive of separately billable procedures and treating other patients.  Critical care was necessary to treat or prevent imminent or life-threatening deterioration.  Critical care was time spent personally by me on the following activities: development of treatment plan with patient and/or surrogate as well as nursing, discussions with consultants, evaluation of patient's response to treatment,  examination of patient, obtaining history from patient or surrogate, ordering and performing treatments and interventions, ordering and review of laboratory studies, ordering and review of radiographic studies, pulse oximetry  and re-evaluation of patient's condition.  Labs Review Labs Reviewed  TROPONIN I  CBC  I-STAT TROPOININ, ED  I-STAT CHEM 8, ED    Imaging Review No results found.   EKG Interpretation   Date/Time:  Monday Apr 12 2014 14:52:07 EDT Ventricular Rate:  88 PR Interval:  180 QRS Duration: 82 QT Interval:  376 QTC Calculation: 454 R Axis:   4 Text Interpretation:  Sinus rhythm with Premature atrial complexes  Inferior infarct , age undetermined Possible Anterolateral infarct , age  undetermined Abnormal ECG Confirmed by Jenin Birdsall  MD, Doretta Remmert ([id number]) on  04/12/2014 3:01:54 PM      MDM   Final diagnoses:  NSTEMI (non-ST elevated myocardial infarction)   chest pain  Patient with clinical concern for CAD/and STEMI/acute myocardial infarction. Although no risk factors except for age patient has an exertional story with mild inferior elevation and reciprocal changes laterally. Repeat EKG showed improvement. Spoke with cardiology immediately who agreed with concerns and is evaluating patient currently in ED. Aspirin, nitroglycerin and morphine when necessary ordered. Portable chest x-ray ordered Likely plan for cath lab. On recheck patient felt his pain was moderate and has improved with no acute treatment. Cardiology evaluated in ED and recommended cath lab immediately. Patient comfortable the plan. The patients results and plan were reviewed and discussed.   Any x-rays performed were personally reviewed by myself.   Differential diagnosis were considered with the presenting HPI.   Filed Vitals:   04/12/14 1458  BP: 139/78  Pulse: 83  Temp: 97.7 F (36.5 C)  TempSrc: Oral  Resp: 20  Weight: 249 lb (112.946 kg)  SpO2: 99%    Admission/ observation were discussed with the admitting physician, patient and/or family and they are comfortable with the plan.     Mariea Clonts, MD 04/13/14 [phone]

## 2014-04-12 NOTE — Progress Notes (Signed)
I notified Dr.Yousef for elevated troponin 3.48 post cath patient a symptomatic. I will continue to monitor patient.

## 2014-04-13 ENCOUNTER — Encounter (HOSPITAL_COMMUNITY): Payer: Self-pay | Admitting: Physician Assistant

## 2014-04-13 DIAGNOSIS — N183 Chronic kidney disease, stage 3 unspecified: Secondary | ICD-10-CM

## 2014-04-13 DIAGNOSIS — I517 Cardiomegaly: Secondary | ICD-10-CM

## 2014-04-13 LAB — BASIC METABOLIC PANEL
BUN: 16 mg/dL (ref 6–23)
CO2: 25 mEq/L (ref 19–32)
Calcium: 9.4 mg/dL (ref 8.4–10.5)
Chloride: 104 mEq/L (ref 96–112)
Creatinine, Ser: 0.86 mg/dL (ref 0.50–1.35)
GFR calc Af Amer: >90 mL/min (ref >90)
GFR calc non Af Amer: 85 mL/min — ABNORMAL LOW (ref >90)
Glucose, Bld: 94 mg/dL (ref 70–99)
Potassium: 4.1 mEq/L (ref 3.7–5.3)
Sodium: 141 mEq/L (ref 137–147)

## 2014-04-13 LAB — LIPID PANEL
Cholesterol: 204 mg/dL — ABNORMAL HIGH (ref 0–200)
HDL: 44 mg/dL (ref >39)
LDL Cholesterol: 132 mg/dL — ABNORMAL HIGH (ref 0–99)
Total CHOL/HDL Ratio: 4.6 RATIO
Triglycerides: 140 mg/dL (ref <150)
VLDL: 28 mg/dL (ref 0–40)

## 2014-04-13 LAB — CBC
HCT: 42.4 % (ref 39.0–52.0)
Hemoglobin: 14.3 g/dL (ref 13.0–17.0)
MCH: 32.4 pg (ref 26.0–34.0)
MCHC: 33.7 g/dL (ref 30.0–36.0)
MCV: 96.1 fL (ref 78.0–100.0)
Platelets: 194 10*3/uL (ref 150–400)
RBC: 4.41 MIL/uL (ref 4.22–5.81)
RDW: 12.7 % (ref 11.5–15.5)
WBC: 5.8 10*3/uL (ref 4.0–10.5)

## 2014-04-13 LAB — TROPONIN I
Troponin I: 6.39 ng/mL (ref <0.30)
Troponin I: 3.3 ng/mL (ref <0.30)

## 2014-04-13 MED ORDER — SODIUM CHLORIDE 0.9 % IV SOLN
1.0000 mL/kg/h | INTRAVENOUS | Status: DC
  Administered 2014-04-14: 1 mL/kg/h via INTRAVENOUS

## 2014-04-13 MED ORDER — SODIUM CHLORIDE 0.9 % IJ SOLN: 3.0000 mL | Freq: Two times a day (BID) | INTRAMUSCULAR | Status: DC

## 2014-04-13 MED ORDER — PNEUMOCOCCAL VAC POLYVALENT 25 MCG/0.5ML IJ INJ
0.5000 mL | INJECTION | INTRAMUSCULAR | Status: AC
  Administered 2014-04-13: 0.5 mL via INTRAMUSCULAR
  Filled 2014-04-13: qty 0.5

## 2014-04-13 MED ORDER — ASPIRIN 81 MG PO CHEW
81.0000 mg | CHEWABLE_TABLET | ORAL | Status: AC
  Administered 2014-04-14: 06:00:00 81 mg via ORAL
  Filled 2014-04-13: qty 1

## 2014-04-13 MED ORDER — SODIUM CHLORIDE 0.9 % IJ SOLN: 3.0000 mL | INTRAMUSCULAR | Status: DC | PRN

## 2014-04-13 MED ORDER — SODIUM CHLORIDE 0.9 % IV SOLN: 250.0000 mL | INTRAVENOUS | Status: DC | PRN

## 2014-04-13 MED FILL — Sodium Chloride IV Soln 0.9%: INTRAVENOUS | Qty: 50 | Status: AC

## 2014-04-13 NOTE — Progress Notes (Signed)
CARDIAC REHAB PHASE I   PRE:  Rate/Rhythm: 82 SR  BP:  Supine: 138/67  Sitting:   Standing:    SaO2: 98 RA  MODE:  Ambulation: 1000 ft   POST:  Rate/Rhythm: 101 ST  BP:  Supine:   Sitting: 160/82  Standing:    SaO2:  0750-0850 Pt tolerated ambulation well without c/o of cp or SOB. BP after walk 160/82. Pt to recliner after walk with call light in reach and wife present. Started MI and stent education with pt and wife. They voice understanding. Pt agrees to [facility]. CRP in [place], will send referral. We will continue to follow pt to ambulate and finish education.   Rodney Langton RN 04/13/2014 8:50 AM

## 2014-04-13 NOTE — Progress Notes (Signed)
Patient Name: Allen James Date of Encounter: 04/13/2014     Active Problems:   NSTEMI (non-ST elevated myocardial infarction)    SUBJECTIVE  No chest pain or SOB.  CURRENT MEDS . aspirin  81 mg Oral Daily  . atorvastatin  80 mg Oral q1800  . heparin  5,000 Units Subcutaneous 3 times per day  . metoprolol tartrate  12.5 mg Oral BID  . sodium chloride  3 mL Intravenous Q12H  . ticagrelor  90 mg Oral BID  . traMADol  100 mg Oral TID    OBJECTIVE  Filed Vitals:   04/12/14 2030 04/12/14 2100 04/13/14 0015 04/13/14 0418  BP: 160/80 147/65 123/64 148/81  Pulse: 80 82 80 76  Temp: 97.8 F (36.6 C)  97.8 F (36.6 C) 97.6 F (36.4 C)  TempSrc: Oral  Oral Oral  Resp: 18  18 20   Height:      Weight:   250 lb 3.6 oz (113.5 kg)   SpO2: 98% 96% 99% 98%    Intake/Output Summary (Last 24 hours) at 04/13/14 0624 Last data filed at 04/13/14 0130  Gross per 24 hour  Intake 931.43 ml  Output      0 ml  Net 931.43 ml   Filed Weights   04/12/14 1458 04/13/14 0015  Weight: 249 lb (112.946 kg) 250 lb 3.6 oz (113.5 kg)    PHYSICAL EXAM  General: Pleasant, NAD. Neuro: Alert and oriented X 3. Moves all extremities spontaneously. Psych: Normal affect. HEENT:  Normal  Neck: Supple without bruits or JVD. Lungs:  Resp regular and unlabored, CTA. Heart: RRR no s3, s4, or murmurs. Abdomen: Soft, non-tender, non-distended, BS + x 4. The abdomen is obese Extremities: No clubbing, cyanosis or edema. DP/PT/Radials 2+ and equal bilaterally.  Accessory Clinical Findings  CBC  Recent Labs  04/12/14 1505 04/12/14 1521  WBC 6.5  --   HGB 15.6 16.7  HCT 45.2 49.0  MCV 94.8  --   PLT 208  --    Basic Metabolic Panel  Recent Labs  04/12/14 1521  NA 142  K 4.3  CL 107  GLUCOSE 129*  BUN 19  CREATININE 1.60*   Cardiac Enzymes  Recent Labs  04/12/14 1505 04/12/14 1958 04/13/14 0058  TROPONINI <0.30 3.48* 6.39*    TELE  NSR with  PACs  Radiology/Studies  Dg Chest Portable 1 View  04/12/2014   CLINICAL DATA:  Chest pain.  Cardiac stent placement today.  EXAM: PORTABLE CHEST - 1 VIEW  COMPARISON:  Abdominal CT 10/07/2012 not available  FINDINGS: Cardiopericardial enlargement which is accentuated by low lung volumes. The aortic knob is visualized and unremarkable. No edema, consolidation, effusion, or pneumothorax.  IMPRESSION: 1. Cardiopericardial enlargement without comparison to establish chronicity. 2. No edema or consolidation.     ASSESSMENT AND PLAN Allen James is a 73 y.o. male with a history of obesity and OSA on CPAP and other past medical history who presented to [facility] on 04/12/14 with chest pain. His initial EKG was suggestive of inferolateral injury. However, a followup EKG shortly after the initial tracing showed resolution of his ST changes consistent with an aborted inferior wall STEMI. He was treated as ACS and transported emergently to the cardiac catheterization lab.  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 There was a patent LAD with  diffuse nonobstructive disease  -- Continue ASA/Brilinta -- 2 D ECHO today for assessment of LV function -- Staged PCI of OM branch Wednesday pending assessment of patient's clinical stability and LV function -- For next cath -- do not use right radial access.    OSA - continue CPAP  AKI- creat 1.6 yesterday, BMET pending today. Continue to monitor   Signed, Perry Mount PA-C  Pager [phone] Patient seen and examined and history reviewed. Agree with above findings and plan. Patient feels great this am. No chest pain. No radial site hematoma. VSS. Lungs clear. For Echo today to assess LV function. Continue DAPT. Plan to return for staged PCI of the LCx/OM tomorrow via left radial approach. Awaiting follow up  BMET.  Ander Slade [facility]  04/13/2014 7:32 AM

## 2014-04-13 NOTE — Progress Notes (Signed)
  Echocardiogram 2D Echocardiogram has been performed.  Carney Corners 04/13/2014, 3:09 PM

## 2014-04-13 NOTE — Care Management Note (Addendum)
    Page 1 of 1   04/15/2014     11:36:49 AM CARE MANAGEMENT NOTE 04/15/2014  Patient:  Allen James, Allen James   Account Number:  [id number]  Date Initiated:  04/13/2014  Documentation initiated by:  Fuller Mandril  Subjective/Objective Assessment:   73 y.o. male w/ PMHx significant for no major medical problems who presented to [facility] on 04/12/2014 with complaints of chest pain. //Home with spouse.     Action/Plan:   Procedure: Catheter placement for coronary angiography, Selective Coronary Angiography, PTCA and stenting of the native RCA.// Benefits check for Brilinta      Indication: NSTEMI (aborted inferolateral STEMI with transient ST elevation).   Anticipated DC Date:  04/14/2014   Anticipated DC Plan:  [place]  CM consult  Medication Assistance      Choice offered to / List presented to:             Status of service:  Completed, signed off Medicare Important Message given?  YES (If response is "NO", the following Medicare IM given date fields will be blank) Date Medicare IM given:   Date Additional Medicare IM given:  04/15/2014  Discharge Disposition:  HOME/SELF CARE  Per UR Regulation:  Reviewed for med. necessity/level of care/duration of stay  If discussed at [place] of Stay Meetings, dates discussed:    Comments:  04/13/14 [name] J. Clydene Laming, RN, BSN, [employer] [phone] Spoke with pt at bedside regarding benefits check for Brilinta.  Pt has brochure with 30 day free card and refill assistance card intact.  Pt utilizes [facility] on [address] for prescription needs.  NCM called pharmacy to confirm availability of medication.  Information relayed to pt.  Pt verbalizes importance of filling medication upon discharge.

## 2014-04-14 ENCOUNTER — Encounter (HOSPITAL_COMMUNITY)
Admission: EM | Disposition: A | Discharge status: Home / Self Care | Payer: Self-pay | Source: Home / Self Care | Attending: Cardiology

## 2014-04-14 ENCOUNTER — Encounter (HOSPITAL_COMMUNITY)
Admission: EM | Disposition: A | Discharge status: Home / Self Care | Payer: Medicare HMO | Source: Home / Self Care | Attending: Cardiology

## 2014-04-14 ENCOUNTER — Encounter (HOSPITAL_COMMUNITY): Payer: Self-pay | Admitting: Physician Assistant

## 2014-04-14 DIAGNOSIS — S37009A Unspecified injury of unspecified kidney, initial encounter: Secondary | ICD-10-CM | POA: Insufficient documentation

## 2014-04-14 DIAGNOSIS — E669 Obesity, unspecified: Secondary | ICD-10-CM | POA: Insufficient documentation

## 2014-04-14 DIAGNOSIS — G4733 Obstructive sleep apnea (adult) (pediatric): Secondary | ICD-10-CM | POA: Diagnosis present

## 2014-04-14 DIAGNOSIS — N179 Acute kidney failure, unspecified: Secondary | ICD-10-CM | POA: Diagnosis present

## 2014-04-14 DIAGNOSIS — I2510 Atherosclerotic heart disease of native coronary artery without angina pectoris: Secondary | ICD-10-CM | POA: Diagnosis present

## 2014-04-14 DIAGNOSIS — E785 Hyperlipidemia, unspecified: Secondary | ICD-10-CM | POA: Diagnosis present

## 2014-04-14 DIAGNOSIS — I25119 Atherosclerotic heart disease of native coronary artery with unspecified angina pectoris: Secondary | ICD-10-CM | POA: Insufficient documentation

## 2014-04-14 DIAGNOSIS — Z9989 Dependence on other enabling machines and devices: Secondary | ICD-10-CM

## 2014-04-14 HISTORY — DX: Unspecified injury of unspecified kidney, initial encounter: S37.009A

## 2014-04-14 HISTORY — DX: Obesity, unspecified: E66.9

## 2014-04-14 HISTORY — DX: Atherosclerotic heart disease of native coronary artery without angina pectoris: I25.10

## 2014-04-14 HISTORY — DX: Acute kidney failure, unspecified: N17.9

## 2014-04-14 HISTORY — PX: PERCUTANEOUS CORONARY STENT INTERVENTION (PCI-S): SHX5485

## 2014-04-14 LAB — CBC
HCT: 42.3 % (ref 39.0–52.0)
Hemoglobin: 14 g/dL (ref 13.0–17.0)
MCH: 32.2 pg (ref 26.0–34.0)
MCHC: 33.1 g/dL (ref 30.0–36.0)
MCV: 97.2 fL (ref 78.0–100.0)
Platelets: 172 10*3/uL (ref 150–400)
RBC: 4.35 MIL/uL (ref 4.22–5.81)
RDW: 12.7 % (ref 11.5–15.5)
WBC: 6 10*3/uL (ref 4.0–10.5)

## 2014-04-14 LAB — BASIC METABOLIC PANEL
BUN: 16 mg/dL (ref 6–23)
CO2: 23 mEq/L (ref 19–32)
Calcium: 8.9 mg/dL (ref 8.4–10.5)
Chloride: 104 mEq/L (ref 96–112)
Creatinine, Ser: 0.92 mg/dL (ref 0.50–1.35)
GFR calc Af Amer: >90 mL/min (ref >90)
GFR calc non Af Amer: 83 mL/min — ABNORMAL LOW (ref >90)
Glucose, Bld: 103 mg/dL — ABNORMAL HIGH (ref 70–99)
Potassium: 4.1 mEq/L (ref 3.7–5.3)
Sodium: 140 mEq/L (ref 137–147)

## 2014-04-14 LAB — HEPATIC FUNCTION PANEL
ALT: 20 U/L (ref 0–53)
AST: 36 U/L (ref 0–37)
Albumin: 3.8 g/dL (ref 3.5–5.2)
Alkaline Phosphatase: 79 U/L (ref 39–117)
Bilirubin, Direct: <0.2 mg/dL (ref 0.0–0.3)
Total Bilirubin: 0.5 mg/dL (ref 0.3–1.2)
Total Protein: 7.1 g/dL (ref 6.0–8.3)

## 2014-04-14 LAB — PROTIME-INR
INR: 1 (ref 0.00–1.49)
Prothrombin Time: 13 seconds (ref 11.6–15.2)

## 2014-04-14 LAB — POCT ACTIVATED CLOTTING TIME: Activated Clotting Time: 348 seconds

## 2014-04-14 SURGERY — PERCUTANEOUS CORONARY STENT INTERVENTION (PCI-S)
Anesthesia: LOCAL

## 2014-04-14 MED ORDER — FENTANYL CITRATE 0.05 MG/ML IJ SOLN
INTRAMUSCULAR | Status: AC
  Filled 2014-04-14: qty 2

## 2014-04-14 MED ORDER — VERAPAMIL HCL 2.5 MG/ML IV SOLN
INTRAVENOUS | Status: AC
  Filled 2014-04-14: qty 2

## 2014-04-14 MED ORDER — HEPARIN SODIUM (PORCINE) 1000 UNIT/ML IJ SOLN
INTRAMUSCULAR | Status: AC
  Filled 2014-04-14: qty 1

## 2014-04-14 MED ORDER — MIDAZOLAM HCL 2 MG/2ML IJ SOLN
INTRAMUSCULAR | Status: AC
  Filled 2014-04-14: qty 2

## 2014-04-14 MED ORDER — HEPARIN (PORCINE) IN NACL 2-0.9 UNIT/ML-% IJ SOLN
INTRAMUSCULAR | Status: AC
  Filled 2014-04-14: qty 1000

## 2014-04-14 MED ORDER — NITROGLYCERIN 0.2 MG/ML ON CALL CATH LAB
INTRAVENOUS | Status: AC
  Filled 2014-04-14: qty 1

## 2014-04-14 MED ORDER — LIDOCAINE HCL (PF) 1 % IJ SOLN
INTRAMUSCULAR | Status: AC
  Filled 2014-04-14: qty 30

## 2014-04-14 MED ORDER — SODIUM CHLORIDE 0.9 % IV SOLN
1.0000 mL/kg/h | INTRAVENOUS | Status: AC
  Administered 2014-04-14: 1 mL/kg/h via INTRAVENOUS

## 2014-04-14 NOTE — Interval H&P Note (Signed)
History and Physical Interval Note:  04/14/2014 11:25 AM  Allen James  has presented today for surgery, with the diagnosis of cad  The various methods of treatment have been discussed with the patient and family. After consideration of risks, benefits and other options for treatment, the patient has consented to  Procedure(s): PERCUTANEOUS CORONARY STENT INTERVENTION (PCI-S) (N/A) as a surgical intervention .  The patient's history has been reviewed, patient examined, no change in status, stable for surgery.  I have reviewed the patient's chart and labs.  Questions were answered to the patient's satisfaction.    Cath Lab Visit (complete for each Cath Lab visit)  Clinical Evaluation Leading to the Procedure:   ACS: yes  Non-ACS:    Anginal Classification: CCS IV  Anti-ischemic medical therapy: Minimal Therapy (1 class of medications)  Non-Invasive Test Results: No non-invasive testing performed  Prior CABG: No previous CABG       Ander Slade [facility] 04/14/2014 11:25 AM

## 2014-04-14 NOTE — Progress Notes (Signed)
Patient: Allen James / Admit Date: 04/12/2014 / Date of Encounter: 04/14/2014, 6:43 AM  Subjective  Feels good. No CP or SOB.  Objective   Telemetry: NSR with occ PACs/PVCs  Physical Exam: Blood pressure 108/72, pulse 78, temperature 98.2 F (36.8 C), temperature source Oral, resp. rate 20, height 6\' 1"  (1.854 m), weight 251 lb 5.2 oz (114 kg), SpO2 100.00%. General: Well developed, well nourished WM in no acute distress. Head: Normocephalic, atraumatic, sclera non-icteric, no xanthomas, nares are without discharge. Neck: JVP not elevated. Lungs: Clear bilaterally to auscultation without wheezes, rales, or rhonchi. Breathing is unlabored. Heart: RRR S1 S2 without murmurs, rubs, or gallops.  Abdomen: Soft, non-tender, non-distended with normoactive bowel sounds. No rebound/guarding. Extremities: No clubbing or cyanosis. No edema. Distal pedal pulses are 2+ and equal bilaterally. R radial wrist cath side without hematoma or bleeding. Neuro: Alert and oriented X 3. Moves all extremities spontaneously. Psych:  Responds to questions appropriately with a normal affect.   Intake/Output Summary (Last 24 hours) at 04/14/14 0643 Last data filed at 04/13/14 1300  Gross per 24 hour  Intake    560 ml  Output      0 ml  Net    560 ml    Inpatient Medications:  . aspirin  81 mg Oral Daily  . atorvastatin  80 mg Oral q1800  . heparin  5,000 Units Subcutaneous 3 times per day  . metoprolol tartrate  12.5 mg Oral BID  . ticagrelor  90 mg Oral BID  . traMADol  100 mg Oral TID   Infusions:  . sodium chloride 1 mL/kg/hr (04/14/14 0430)    Labs:  Recent Labs  04/13/14 1050 04/14/14 0423  NA 141 140  K 4.1 4.1  CL 104 104  CO2 25 23  GLUCOSE 94 103*  BUN 16 16  CREATININE 0.86 0.92  CALCIUM 9.4 8.9   No results found for this basename: AST, ALT, ALKPHOS, BILITOT, PROT, ALBUMIN,  in the last 72 hours  Recent Labs  04/13/14 0857 04/14/14 0423  WBC 5.8 6.0  HGB 14.3 14.0    HCT 42.4 42.3  MCV 96.1 97.2  PLT 194 172    Recent Labs  04/12/14 1505 04/12/14 1958 04/13/14 0058 04/13/14 0857  TROPONINI <0.30 3.48* 6.39* 3.30*   No components found with this basename: POCBNP,  No results found for this basename: HGBA1C,  in the last 72 hours   Radiology/Studies:  Dg Chest Portable 1 View  04/12/2014   CLINICAL DATA:  Chest pain.  Cardiac stent placement today.  EXAM: PORTABLE CHEST - 1 VIEW  COMPARISON:  Abdominal CT 10/07/2012 not available  FINDINGS: Cardiopericardial enlargement which is accentuated by low lung volumes. The aortic knob is visualized and unremarkable. No edema, consolidation, effusion, or pneumothorax.  IMPRESSION: 1. Cardiopericardial enlargement without comparison to establish chronicity. 2. No edema or consolidation.   Electronically Signed   By: Jorje Guild M.D.   On: 04/12/2014 19:46     Assessment and Plan  [name] is a 73 y.o. male with a history of obesity and OSA on CPAP and other past medical history who presented to [facility] on 04/12/14 with chest pain. His initial EKG was suggestive of inferolateral injury. However, a followup EKG shortly after the initial tracing showed resolution of his ST changes consistent with an aborted inferior wall STEMI. He was treated as ACS and transported emergently to the cardiac catheterization lab.   1. NSTEMI with transient inferior wall  ST elevation on admission - peak trop 6.39. 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 There was a patent LAD with diffuse nonobstructive disease. Echo: EF 65-70%, n RWMA, grade 1 d/d. Continue aspirin, Brilinta, BB, statin. Plan for staged PCI to LCx/OM today. Would not use RIGHT radial access for cath/PCI procedures per Dr. Antionette Char note. 2. OSA - continue CPAP qhs. 3. AKI, resolved - peak Cr 1.6, now normal. 4. Hyperlipidemia LDL 132 - continue high  dose statin. Add baseline LFTs onto labs.  5. Obesity Body mass index is 33.17 kg/(m^2). - will need lifestyle mod upon DC.   Signed, Melina Copa PA-C Patient seen and examined and history reviewed. Agree with above findings and plan. Renal function improved. VSS. No recurrent chest pain. Will proceed with staged PCI of the OM today.  Ander Slade [facility] 04/14/2014 9:52 AM

## 2014-04-14 NOTE — H&P (View-Only) (Signed)
Patient: Allen James / Admit Date: 04/12/2014 / Date of Encounter: 04/14/2014, 6:43 AM  Subjective  Feels good. No CP or SOB.  Objective   Telemetry: NSR with occ PACs/PVCs  Physical Exam: Blood pressure 108/72, pulse 78, temperature 98.2 F (36.8 C), temperature source Oral, resp. rate 20, height 6\' 1"  (1.854 m), weight 251 lb 5.2 oz (114 kg), SpO2 100.00%. General: Well developed, well nourished WM in no acute distress. Head: Normocephalic, atraumatic, sclera non-icteric, no xanthomas, nares are without discharge. Neck: JVP not elevated. Lungs: Clear bilaterally to auscultation without wheezes, rales, or rhonchi. Breathing is unlabored. Heart: RRR S1 S2 without murmurs, rubs, or gallops.  Abdomen: Soft, non-tender, non-distended with normoactive bowel sounds. No rebound/guarding. Extremities: No clubbing or cyanosis. No edema. Distal pedal pulses are 2+ and equal bilaterally. R radial wrist cath side without hematoma or bleeding. Neuro: Alert and oriented X 3. Moves all extremities spontaneously. Psych:  Responds to questions appropriately with a normal affect.   Intake/Output Summary (Last 24 hours) at 04/14/14 0643 Last data filed at 04/13/14 1300  Gross per 24 hour  Intake    560 ml  Output      0 ml  Net    560 ml    Inpatient Medications:  . aspirin  81 mg Oral Daily  . atorvastatin  80 mg Oral q1800  . heparin  5,000 Units Subcutaneous 3 times per day  . metoprolol tartrate  12.5 mg Oral BID  . ticagrelor  90 mg Oral BID  . traMADol  100 mg Oral TID   Infusions:  . sodium chloride 1 mL/kg/hr (04/14/14 0430)    Labs:  Recent Labs  04/13/14 1050 04/14/14 0423  NA 141 140  K 4.1 4.1  CL 104 104  CO2 25 23  GLUCOSE 94 103*  BUN 16 16  CREATININE 0.86 0.92  CALCIUM 9.4 8.9   No results found for this basename: AST, ALT, ALKPHOS, BILITOT, PROT, ALBUMIN,  in the last 72 hours  Recent Labs  04/13/14 0857 04/14/14 0423  WBC 5.8 6.0  HGB 14.3 14.0    HCT 42.4 42.3  MCV 96.1 97.2  PLT 194 172    Recent Labs  04/12/14 1505 04/12/14 1958 04/13/14 0058 04/13/14 0857  TROPONINI <0.30 3.48* 6.39* 3.30*   No components found with this basename: POCBNP,  No results found for this basename: HGBA1C,  in the last 72 hours   Radiology/Studies:  Dg Chest Portable 1 View  04/12/2014   CLINICAL DATA:  Chest pain.  Cardiac stent placement today.  EXAM: PORTABLE CHEST - 1 VIEW  COMPARISON:  Abdominal CT 10/07/2012 not available  FINDINGS: Cardiopericardial enlargement which is accentuated by low lung volumes. The aortic knob is visualized and unremarkable. No edema, consolidation, effusion, or pneumothorax.  IMPRESSION: 1. Cardiopericardial enlargement without comparison to establish chronicity. 2. No edema or consolidation.   Electronically Signed   By: Jorje Guild M.D.   On: 04/12/2014 19:46     Assessment and Plan  Allen James is a 73 y.o. male with a history of obesity and OSA on CPAP and other past medical history who presented to [facility] on 04/12/14 with chest pain. His initial EKG was suggestive of inferolateral injury. However, a followup EKG shortly after the initial tracing showed resolution of his ST changes consistent with an aborted inferior wall STEMI. He was treated as ACS and transported emergently to the cardiac catheterization lab.   1. NSTEMI with transient inferior wall  ST elevation on admission - peak trop 6.39. 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 There was a patent LAD with diffuse nonobstructive disease. Echo: EF 65-70%, n RWMA, grade 1 d/d. Continue aspirin, Brilinta, BB, statin. Plan for staged PCI to LCx/OM today. Would not use RIGHT radial access for cath/PCI procedures per Dr. Antionette Char note. 2. OSA - continue CPAP qhs. 3. AKI, resolved - peak Cr 1.6, now normal. 4. Hyperlipidemia LDL 132 - continue high  dose statin. Add baseline LFTs onto labs.  5. Obesity Body mass index is 33.17 kg/(m^2). - will need lifestyle mod upon DC.   Signed, Melina Copa PA-C Patient seen and examined and history reviewed. Agree with above findings and plan. Renal function improved. VSS. No recurrent chest pain. Will proceed with staged PCI of the OM today.  Ander Slade [facility] 04/14/2014 9:52 AM

## 2014-04-14 NOTE — CV Procedure (Signed)
    CARDIAC CATH NOTE  Name: Allen James MRN: [id number] DOB: 1941/11/04  Procedure: PTCA and stenting of the first OM  Indication: 73 yo WM with recent NSTEMI and complex stenting of the RCA presents for staged PCI of the first OM which has a 90-95% mid vessel stenosis.  Procedural Details: The left wrist was prepped, draped, and anesthetized with 1% lidocaine. Using the modified Seldinger technique, a 6 Fr sheath was introduced into the left radial artery. 3 mg verapamil was administered through the radial sheath. Weight-based Haparin was given for anticoagulation. Once a therapeutic ACT was achieved, a 6 [place] left Voda 3.5 guide catheter was inserted.  A prowater coronary guidewire was used to cross the lesion.  The lesion was predilated with a 2.5 mm balloon.  The lesion was then stented with a 2.75 x 12 mm Resolulte stent.  The stent was postdilated with a 2.75 mm noncompliant balloon.  Following PCI, there was 0% residual stenosis and TIMI-3 flow. Final angiography confirmed an excellent result. The patient tolerated the procedure well. There were no immediate procedural complications. A TR band was used for radial hemostasis. The patient was transferred to the post catheterization recovery area for further monitoring.  Lesion Data: Vessel: OM 1 Percent stenosis (pre): 90-95% TIMI-flow (pre):  3 Stent:  2.75 x 12 mm Resolute DES Percent stenosis (post): 0% TIMI-flow (post): 3  Conclusions: Successful stenting of the first OM with a DES.  Recommendations: Dual antiplatelet therapy for at least one year. Anticipate DC in am.  Ander Slade [facility] 04/14/2014, 12:04 PM

## 2014-04-14 NOTE — Progress Notes (Signed)
CARDIAC REHAB PHASE I   PRE:  Rate/Rhythm: 80 SR  BP:  Supine: 129/78  Sitting:   Standing:    SaO2:   MODE:  Ambulation: 1000 ft   POST:  Rate/Rhythm: 98 SR  BP:  Supine:   Sitting: 142/80  Standing:    SaO2:  0830-0850 Pt tolerated ambulation well without c/o of cp or SOB. VS stable Pt to side of bed after walk. We will follow pt in am.  Rodney Langton RN 04/14/2014 8:53 AM

## 2014-04-14 NOTE — Progress Notes (Signed)
Pt has order for CPAP. Spoke with pt, pt has all home equipment and stated he did not need anything and would place himself on his own CPAP. RT encouraged if pt had any questions, or concerns to call. RT will continue to monitor.

## 2014-04-15 ENCOUNTER — Encounter (HOSPITAL_COMMUNITY): Payer: Self-pay | Admitting: Physician Assistant

## 2014-04-15 DIAGNOSIS — G4733 Obstructive sleep apnea (adult) (pediatric): Secondary | ICD-10-CM

## 2014-04-15 DIAGNOSIS — I2510 Atherosclerotic heart disease of native coronary artery without angina pectoris: Secondary | ICD-10-CM

## 2014-04-15 LAB — BASIC METABOLIC PANEL
BUN: 14 mg/dL (ref 6–23)
CO2: 24 mEq/L (ref 19–32)
Calcium: 9.1 mg/dL (ref 8.4–10.5)
Chloride: 104 mEq/L (ref 96–112)
Creatinine, Ser: 0.98 mg/dL (ref 0.50–1.35)
GFR calc Af Amer: >90 mL/min (ref >90)
GFR calc non Af Amer: 81 mL/min — ABNORMAL LOW (ref >90)
Glucose, Bld: 107 mg/dL — ABNORMAL HIGH (ref 70–99)
Potassium: 4.1 mEq/L (ref 3.7–5.3)
Sodium: 140 mEq/L (ref 137–147)

## 2014-04-15 LAB — CBC
HCT: 39.9 % (ref 39.0–52.0)
Hemoglobin: 13.2 g/dL (ref 13.0–17.0)
MCH: 32.2 pg (ref 26.0–34.0)
MCHC: 33.1 g/dL (ref 30.0–36.0)
MCV: 97.3 fL (ref 78.0–100.0)
Platelets: 180 10*3/uL (ref 150–400)
RBC: 4.1 MIL/uL — ABNORMAL LOW (ref 4.22–5.81)
RDW: 12.6 % (ref 11.5–15.5)
WBC: 5.9 10*3/uL (ref 4.0–10.5)

## 2014-04-15 MED ORDER — NITROGLYCERIN 0.4 MG SL SUBL: 0.4000 mg | SUBLINGUAL_TABLET | SUBLINGUAL | Status: DC | PRN

## 2014-04-15 MED ORDER — ATORVASTATIN CALCIUM 80 MG PO TABS: 80.0000 mg | ORAL_TABLET | Freq: Every day | ORAL | Status: DC

## 2014-04-15 MED ORDER — METOPROLOL SUCCINATE ER 25 MG PO TB24: 25.0000 mg | ORAL_TABLET | Freq: Every day | ORAL | Status: DC

## 2014-04-15 MED ORDER — TICAGRELOR 90 MG PO TABS: 90.0000 mg | ORAL_TABLET | Freq: Two times a day (BID) | ORAL | Status: DC

## 2014-04-15 NOTE — Discharge Summary (Signed)
CARDIOLOGY DISCHARGE SUMMARY   Patient ID: DONIE ESTIS MRN: [id number] DOB/AGE: December 02, 1940 73 y.o.  Admit date: 04/12/2014 Discharge date: 04/15/2014  PCP: Nilda Simmer, MD Primary Cardiologist: Dr. Burt Knack  Primary Discharge Diagnosis: NSTEMI (non-ST elevated myocardial infarction) - s/p DES x2 to the RCA and staged PCI with DES x1 to the OM1, EF 65%  Secondary Discharge Diagnosis:    OSA on CPAP   Hyperlipidemia   CAD (coronary artery disease)   AKI (acute kidney injury)   Obesity  Procedures: Catheter placement for coronary angiography, Selective Coronary Angiography, PTCA and stenting of the native RCA, 2 D Echocardiogram   Hospital Course: Allen James is a 73 y.o. male with no history of CAD. He had chest pain on the day of admission and came to the hospital where his ECG was abnormal but it improved with resolution of his chest pain. He was admitted for further evaluation and treatment.  His cardiac enzymes elevated indicating a non-ST segment elevation MI. He was taken to the cath lab on 05/18, results are below. He had multivessel disease with a 95% RCA felt to be the culprit lesion. This was treated with a total of 3 drug-eluting stents. It was recommended that he get staged percutaneous intervention of the OM branch.   An echocardiogram was performed to assess left ventricular function, results are below. His EF is preserved with grade 1 diastolic dysfunction.  He was started on appropriate medications for his MI including aspirin, a statin and a low dose of a beta blocker. He is on Brilinta with the aspirin for antiplatelet therapy. He was continued on CPAP for his OSA. His blood pressure was monitored carefully. He had some elevation in his fasting blood sugars and this can be followed as an outpatient. A lipid profile was performed and is reviewed below. He has significant elevation in his LDL and is to remain on high-dose statin therapy.   He was taken back to  the cath lab on 05/20, results are below.   He has successful percutaneous intervention to the OM. He was seen by cardiac rehabilitation and educated on MI restrictions, heart-healthy lifestyle modifications and exercise guidelines.  On 05/21, Mr. Shyne was seen by Dr. Tamala Julian. All data were reviewed. He is ambulating without chest pain or shortness of breath and considered stable for discharge, to followup as an outpatient.   BP 101/56  Pulse 77  Temp(Src) 98.4 F (36.9 C) (Oral)  Resp 20  Ht 6\' 1"  (1.854 m)  Wt 252 lb 13.9 oz (114.7 kg)  BMI 33.37 kg/m2  SpO2 96% General: Well developed, well nourished, male in no acute distress Head: Eyes PERRLA, No xanthomas.   Normocephalic and atraumatic  Lungs: Clear bilaterally to auscultation. Heart: HRRR S1 S2, without MRG.  Pulses are 2+ & equal. No carotid bruit. No JVD. Abdomen: Bowel sounds are present, abdomen soft and non-tender without masses or  hernias noted. Msk: Normal strength and tone for age. Extremities: No clubbing, cyanosis or edema.  Bilateral radial cath sites are without hematoma. Minimal ecchymosis is noted Skin:  No rashes or lesions noted. Neuro: Alert and oriented X 3. Psych:  Good affect, responds appropriately   Labs:  Lab Results  Component Value Date   WBC 5.9 04/15/2014   HGB 13.2 04/15/2014   HCT 39.9 04/15/2014   MCV 97.3 04/15/2014   PLT 180 04/15/2014     Recent Labs Lab 04/14/14 0423 04/15/14 0500  NA 140 140  K 4.1 4.1  CL 104 104  CO2 23 24  BUN 16 14  CREATININE 0.92 0.98  CALCIUM 8.9 9.1  PROT 7.1  --   BILITOT 0.5  --   ALKPHOS 79  --   ALT 20  --   AST 36  --   GLUCOSE 103* 107*    Recent Labs  04/12/14 1958 04/13/14 0058 04/13/14 0857  TROPONINI 3.48* 6.39* 3.30*   Lipid Panel     Component Value Date/Time   CHOL 204* 04/13/2014 1050   TRIG 140 04/13/2014 1050   HDL 44 04/13/2014 1050   CHOLHDL 4.6 04/13/2014 1050   VLDL 28 04/13/2014 1050   LDLCALC 132* 04/13/2014 1050     Recent Labs  04/14/14 0510  INR 1.00      Radiology: Dg Chest Portable 1 View 04/12/2014   CLINICAL DATA:  Chest pain.  Cardiac stent placement today.  EXAM: PORTABLE CHEST - 1 VIEW  COMPARISON:  Abdominal CT 10/07/2012 not available  FINDINGS: Cardiopericardial enlargement which is accentuated by low lung volumes. The aortic knob is visualized and unremarkable. No edema, consolidation, effusion, or pneumothorax.  IMPRESSION: 1. Cardiopericardial enlargement without comparison to establish chronicity. 2. No edema or consolidation.   Electronically Signed   By: Jorje Guild M.D.   On: 04/12/2014 19:46    Cardiac Cath: 04/12/2014 Left mainstem: Widely patent without obstructive disease. Divides into the LAD and left circumflex the Left anterior descending (LAD): The LAD is moderately calcified. There is diffuse 30-40% proximal LAD stenosis. The mid vessel is patent with nonobstructive stenosis of approximately 40% just after the second diagonal branch. The first diagonal has 75% stenosis in its distal portion. The second diagonal is small and has diffuse 70% stenosis. The distal LAD has mild diffuse disease and wraps the LV apex.  Left circumflex (LCx): The proximal circumflex is patent. This is a large caliber vessel. The first obtuse marginal branch has mild diffuse disease in its proximal aspect followed by a napkin ring 90-95% stenosis in its midportion. The AV circumflex in the mid and distal portions are patent and the left posterolateral branch is patent.  Right coronary artery (RCA): This is a large, dominant vessel. The proximal vessel has 95% eccentric stenosis. The distal vessel has 95% hypodense stenosis. There is some ectasia in the intervening portions of the vessel. The PDA and PLA branches are patent with TIMI-3 flow.  Left ventriculography: Deferred  PCI Note: Following the diagnostic procedure, the decision was made to proceed with PCI. It was difficult to get a supportive  guide catheter in position. Ultimately a hockey-stick guide was utilized. A cougar coronary guidewire was used to cross the lesion. The lesion was predilated with a 2.5 mm balloon. The proximal and distal vessel were both heavily diseased. Both lesions were stented with a 3.5 x 28 mm Xience drug-eluting stent distally and a 3.5x38 mm Xience Alpine DES proximally. The stents were deployed in overlapping fashion. The stents were postdilated with a 4.0 mm noncompliant balloon. Following PCI, there was 0% residual stenosis and TIMI-3 flow at both lesion sites. Final angiography confirmed an excellent result. The patient tolerated the procedure well. There were no immediate procedural complications. A TR band was used for radial hemostasis. The patient was transferred to the post catheterization recovery area for further monitoring.  PCI Data:  Lesion 1  Vessel - RCA/Segment - proximal  Percent Stenosis (pre) 95  TIMI-flow 3  Stent 3.5x38 mm Xience Alpine DES  Percent Stenosis (post) 0  TIMI-flow (post) 3  Lesion 2  Vessel - RCA/Segment - distal  Percent Stenosis (pre) 95  TIMI-flow 3  Stent 3.5x28 mm Xience DES  Percent Stenosis (post) 0  TIMI-flow (post) 3  Final Conclusions:  1. Severe 2 vessel CAD with successful PCI of the RCA (complex diffuse disease) and severe residual stenosis of the Left Circ OM branch  2. Patent LAD with diffuse nonobstructive disease  Recommendations:  Check Echo for assessment of LV function  Staged PCI of OM branch Wednesday pending assessment of patient's clinical stability and LV function  WOULD NOT USE RIGHT RADIAL ACCESS FOR FUTURE CATH OR PCI PROCEDURES  04/14/2014  Lesion Data:  Vessel: OM 1  Percent stenosis (pre): 90-95%  TIMI-flow (pre): 3  Stent: 2.75 x 12 mm Resolute DES  Percent stenosis (post): 0%  TIMI-flow (post): 3  Conclusions: Successful stenting of the first OM with a DES.  Recommendations: Dual antiplatelet therapy for at least one  year.  EKG: 04/15/2014 Sinus rhythm with LVH Vent. rate 78 BPM PR interval 174 ms QRS duration 90 ms QT/QTc 400/456 ms P-R-T axes 51 -12 -25  Echo: 04/13/2014 Conclusions - Left ventricle: The cavity size was normal. Wall thickness was normal. Systolic function was vigorous. The estimated ejection fraction was in the range of 65% to 70%. Wall motion was normal; there were no regional wall motion abnormalities. Doppler parameters are consistent with abnormal left ventricular relaxation (grade 1 diastolic dysfunction). - Left atrium: The atrium was moderately dilated.    FOLLOW UP PLANS AND APPOINTMENTS No Known Allergies   Medication List    STOP taking these medications       meloxicam 15 MG tablet  Commonly known as:  MOBIC      TAKE these medications       aspirin EC 81 MG tablet  Take 81 mg by mouth daily.     atorvastatin 80 MG tablet  Commonly known as:  LIPITOR  Take 1 tablet (80 mg total) by mouth daily at 6 PM.     HYDROcodone-acetaminophen 5-325 MG per tablet  Commonly known as:  NORCO/VICODIN  Take 1 tablet by mouth every morning.     metoprolol succinate 25 MG 24 hr tablet  Commonly known as:  TOPROL XL  Take 1 tablet (25 mg total) by mouth daily.     nitroGLYCERIN 0.4 MG SL tablet  Commonly known as:  NITROSTAT  Place 1 tablet (0.4 mg total) under the tongue every 5 (five) minutes as needed for chest pain.     ticagrelor 90 MG Tabs tablet  Commonly known as:  BRILINTA  Take 1 tablet (90 mg total) by mouth 2 (two) times daily.     traMADol 50 MG tablet  Commonly known as:  ULTRAM  Take 100 mg by mouth 3 (three) times daily.        Discharge Instructions   Amb Referral to Cardiac Rehabilitation    Complete by:  As directed      Diet - low sodium heart healthy    Complete by:  As directed      Increase activity slowly    Complete by:  As directed           Follow-up Information   Follow up with Richardson Dopp, PA-C On 04/22/2014. (at  8:50 am)    Specialty:  Physician Assistant   Contact information:   [address] [place] [address] [phone]  BRING ALL MEDICATIONS WITH YOU TO FOLLOW UP APPOINTMENTS  Time spent with patient to include physician time: 38 min Signed: Lonn Georgia, PA-C 04/15/2014, 8:19 AM Co-Sign MD

## 2014-04-15 NOTE — Progress Notes (Signed)
Discussed at length with patient and wife about the MI diagnosis and plan of care, diet, activity, new meds, use of SL NTG prn, when to call MD vs. activation of EMS.  Pt and wife watched MI and PCI videos, questions answered.  Pt ambulated in hall without difficulty.  Rt radial level 1 bruise, soft, bandaid D&I.  Lt radial level 0, drsg D&I.  Care notes for new meds given.  Pt and wife eager to learn, voice understanding and appreciation.  Tele SR w/ freq PJC's.

## 2014-04-15 NOTE — Discharge Summary (Addendum)
The patient is doing well after presenting with ACS. He has received staged PCI and is asymptomatic. He denies medication side effects. Plan discharge today and f/u with Dr. Burt Knack in 1-2 weeks. Dictation and plan of care as noted are accurate and developed according to my direction.

## 2014-04-15 NOTE — Progress Notes (Signed)
[id number] Cardiac Rehab Pt states that he has walked in hall this am, denies any cp or SOB. Completed discharge education with pt and wife. They voice understanding. Deon Pilling, RN 04/15/2014 9:41 AM

## 2014-04-15 NOTE — Discharge Instructions (Signed)
PLEASE REMEMBER TO BRING ALL OF YOUR MEDICATIONS TO EACH OF YOUR FOLLOW-UP OFFICE VISITS. ° °PLEASE ATTEND ALL SCHEDULED FOLLOW-UP APPOINTMENTS.  ° °Activity: Increase activity slowly as tolerated. You may shower, but no soaking baths (or swimming) for 1 week. No driving for 1 week. No lifting over 5 lbs for 2 weeks. No sexual activity for 1 week.  ° °You May Return to Work: in 3 weeks (if applicable) ° °Wound Care: You may wash cath site gently with soap and water. Keep cath site clean and dry. If you notice pain, swelling, bleeding or pus at your cath site, please call [phone]. ° ° ° °Cardiac Cath Site Care °Refer to this sheet in the next few weeks. These instructions provide you with information on caring for yourself after your procedure. Your caregiver may also give you more specific instructions. Your treatment has been planned according to current medical practices, but problems sometimes occur. Call your caregiver if you have any problems or questions after your procedure. °HOME CARE INSTRUCTIONS °· You may shower 24 hours after the procedure. Remove the bandage (dressing) and gently wash the site with plain soap and water. Gently pat the site dry.  °· Do not apply powder or lotion to the site.  °· Do not sit in a bathtub, swimming pool, or whirlpool for 5 to 7 days.  °· No bending, squatting, or lifting anything over 10 pounds (4.5 kg) as directed by your caregiver.  °· Inspect the site at least twice daily.  °· Do not drive home if you are discharged the same day of the procedure. Have someone else drive you.  °· You may drive 24 hours after the procedure unless otherwise instructed by your caregiver.  °What to expect: °· Any bruising will usually fade within 1 to 2 weeks.  °· Blood that collects in the tissue (hematoma) may be painful to the touch. It should usually decrease in size and tenderness within 1 to 2 weeks.  °SEEK IMMEDIATE MEDICAL CARE IF: °· You have unusual pain at the site or down the  affected limb.  °· You have redness, warmth, swelling, or pain at the site.  °· You have drainage (other than a small amount of blood on the dressing).  °· You have chills.  °· You have a fever or persistent symptoms for more than 72 hours.  °· You have a fever and your symptoms suddenly get worse.  °· Your leg becomes pale, cool, tingly, or numb.  °· You have heavy bleeding from the site. Hold pressure on the site.  °Document Released: 12/15/2010 Document Revised: 11/01/2011 Document Reviewed:  ° °

## 2014-04-21 ENCOUNTER — Encounter: Payer: Self-pay | Admitting: *Deleted

## 2014-04-21 NOTE — Progress Notes (Signed)
Cardiology Office Note   Date:  04/22/2014   ID:  JAIQUAN TEMME, DOB [birth year], MRN [id number]  PCP:  Nilda Simmer, MD  Cardiologist:  Dr. Sherren Mocha      History of Present Illness: Allen James is a 73 y.o. male who was admitted 5/18-5/21 with a NSTEMI (aborted inferolateral STEMI with transient ST elevation).  LHC demonstrated critical stenosis in the prox and dist RCA that was treated with DES x 2.  There was significant disease in the OM1 that was treated with DES x 1 in staged procedure 2 days later.  There was residual disease in the LAD and Diagonals that will be treated medically.  EF was preserved on echo.  He returns for follow up.  He is doing well.  The patient denies chest pain, shortness of breath, syncope, orthopnea, PND or significant pedal edema.    Studies:  - LHC (04/12/14):  prox LAD 30-40%, mid LAD 40%, D1 75%, D2 70%, mid OM1 90-95%, prox RCA 95%, dist RCA 95%.  PCI:  3.5x38 mm Xience Alpine DES to prox RCA and 3.5x28 mm Xience DES to dist RCA; staged PCI (04/14/14):  2.75 x 12 mm Resolute DES to OM1.    - Echo (04/13/14):  Vigorous LVF, EF 65-60%, no RWMA, Gr 1 DD, mod LAE   Recent Labs: 04/13/2014: HDL Cholesterol by NMR 44; LDL (calc) 132*  04/14/2014: ALT 20  04/15/2014: Creatinine 0.98; Hemoglobin 13.2; Potassium 4.1   Wt Readings from Last 3 Encounters:  04/22/14 245 lb (111.131 kg)  04/15/14 252 lb 13.9 oz (114.7 kg)  04/15/14 252 lb 13.9 oz (114.7 kg)     Past Medical History  Diagnosis Date  . Back pain   . OSA on CPAP   . CAD (coronary artery disease)     a. NSTEMI 04/12/14 s/p DES x 2 to RCA. Prox 3.5x38 mm Xience Alpine DES, Dist 3.5x28 mm Xience DES; EF 65-70%. Staged PCI to the OM1 with 2.75 x 12 mm Resolute DES     . GERD (gastroesophageal reflux disease)   . H/O hiatal hernia   . Arthritis   . Hyperlipidemia   . Abnormal findings on cardiac catheterization     ! Do NOT use RIGHT radial access on future caths or PCI.    Current  Outpatient Prescriptions  Medication Sig Dispense Refill  . aspirin EC 81 MG tablet Take 81 mg by mouth daily.      Marland Kitchen atorvastatin (LIPITOR) 80 MG tablet Take 1 tablet (80 mg total) by mouth daily at 6 PM.  30 tablet  11  . HYDROcodone-acetaminophen (NORCO/VICODIN) 5-325 MG per tablet Take 1 tablet by mouth every morning.      . metoprolol succinate (TOPROL XL) 25 MG 24 hr tablet Take 1 tablet (25 mg total) by mouth daily.  30 tablet  11  . nitroGLYCERIN (NITROSTAT) 0.4 MG SL tablet Place 1 tablet (0.4 mg total) under the tongue every 5 (five) minutes as needed for chest pain.  25 tablet  3  . ticagrelor (BRILINTA) 90 MG TABS tablet Take 1 tablet (90 mg total) by mouth 2 (two) times daily.  60 tablet  11  . traMADol (ULTRAM) 50 MG tablet Take 100 mg by mouth 3 (three) times daily.       No current facility-administered medications for this visit.    Allergies:   Review of patient's allergies indicates no known allergies.   Social History:  The patient  reports that he quit  smoking about 31 years ago. He has never used smokeless tobacco. He reports that he does not drink alcohol or use illicit drugs.   Family History:  The patient's family history includes Coronary artery disease (age of onset: 72) in his mother.   ROS:  Please see the history of present illness.   No bleeding problems.  He has noted some increased gas.   All other systems reviewed and negative.   PHYSICAL EXAM: VS:  BP 139/68  Pulse 74  Ht 6\' 1"  (1.854 m)  Wt 245 lb (111.131 kg)  BMI 32.33 kg/m2 Well nourished, well developed, in no acute distress HEENT: normal Neck: no JVD Cardiac:  normal S1, S2; RRR; no murmur Lungs:  clear to auscultation bilaterally, no wheezing, rhonchi or rales Abd: soft, nontender, no hepatomegaly Ext: no edemabilateral wrists without hematoma or tenderness Skin: warm and dry Neuro:  CNs 2-12 intact, no focal abnormalities noted  EKG:   NSR, HR 74, poss inf infarct, NSSTTW changes      ASSESSMENT AND PLAN:  1. History of non-ST elevation myocardial infarction (NSTEMI):  He is doing very well after recent 2v PCI in the setting of NSTEMI.  We discussed the importance of dual antiplatelet therapy. Continue beta blocker, statin.  He is awaiting a call from cardiac rehab and plans to start this. 2. CAD (coronary artery disease):  Continue ASA, Brilinta, statin. 3. Hyperlipidemia:  Continue statin. Check Lipids and LFTs in 6 weeks.   4. OSA on CPAP  5. Disposition:  F/u with Dr. Sherren Mocha in 8 weeks.    Signed, Versie Starks, MHS 04/22/2014 9:15 AM    [facility] [place], [place], [place]  [address] Phone: [phone]; Fax: [phone]

## 2014-04-22 ENCOUNTER — Encounter: Payer: Self-pay | Admitting: Physician Assistant

## 2014-04-22 ENCOUNTER — Ambulatory Visit (INDEPENDENT_AMBULATORY_CARE_PROVIDER_SITE_OTHER): Payer: Medicare HMO | Admitting: Physician Assistant

## 2014-04-22 VITALS — BP 139/68 | HR 74 | Ht 73.0 in | Wt 245.0 lb

## 2014-04-22 DIAGNOSIS — E785 Hyperlipidemia, unspecified: Secondary | ICD-10-CM

## 2014-04-22 DIAGNOSIS — Z9989 Dependence on other enabling machines and devices: Secondary | ICD-10-CM

## 2014-04-22 DIAGNOSIS — I2510 Atherosclerotic heart disease of native coronary artery without angina pectoris: Secondary | ICD-10-CM

## 2014-04-22 DIAGNOSIS — G4733 Obstructive sleep apnea (adult) (pediatric): Secondary | ICD-10-CM

## 2014-04-22 DIAGNOSIS — I252 Old myocardial infarction: Secondary | ICD-10-CM

## 2014-04-22 NOTE — Patient Instructions (Signed)
LAB WORK IN 6 WEEKS FOR FASTING LIPID AND LIVER  Your physician recommends that you schedule a follow-up appointment in: [place]  NO CHANGES WERE MADE TODAY

## 2014-04-27 ENCOUNTER — Telehealth: Payer: Self-pay | Admitting: *Deleted

## 2014-04-27 DIAGNOSIS — M791 Myalgia, unspecified site: Secondary | ICD-10-CM | POA: Insufficient documentation

## 2014-04-27 HISTORY — DX: Myalgia, unspecified site: M79.10

## 2014-04-27 NOTE — Addendum Note (Signed)
Addended by: Fernande Boyden on: 04/27/2014 08:28 AM   Modules accepted: Level of Service, SmartSet

## 2014-04-27 NOTE — Telephone Encounter (Signed)
APPOINTMENT: 04/06/2014

## 2014-04-27 NOTE — Telephone Encounter (Signed)
This encounter was created in error - please disregard.

## 2014-04-27 NOTE — Telephone Encounter (Signed)
Faxed provider application part with script signed by Dr Burt Knack, sent to [facility] and [place] to assist with [facility], this part of the application was faxed to me directly from [facility] and [place] for completion on 04/22/2014

## 2014-05-13 ENCOUNTER — Telehealth (HOSPITAL_COMMUNITY): Payer: Self-pay | Admitting: *Deleted

## 2014-05-13 NOTE — Telephone Encounter (Signed)
Pt contacted in regards to cardiac rehab.  Pt declines participation.  Pt is exercising at the [facility] and plans to continue to do so.

## 2014-05-20 ENCOUNTER — Encounter (HOSPITAL_COMMUNITY)
Admission: RE | Admit: 2014-05-20 | Discharge: 2014-05-20 | Disposition: A | Discharge status: Home / Self Care | Payer: Medicare HMO | Source: Ambulatory Visit | Attending: Cardiovascular Disease | Admitting: Cardiovascular Disease

## 2014-05-20 DIAGNOSIS — I2510 Atherosclerotic heart disease of native coronary artery without angina pectoris: Secondary | ICD-10-CM | POA: Insufficient documentation

## 2014-05-20 DIAGNOSIS — Z5189 Encounter for other specified aftercare: Secondary | ICD-10-CM | POA: Insufficient documentation

## 2014-05-20 DIAGNOSIS — E785 Hyperlipidemia, unspecified: Secondary | ICD-10-CM | POA: Insufficient documentation

## 2014-05-20 DIAGNOSIS — I214 Non-ST elevation (NSTEMI) myocardial infarction: Secondary | ICD-10-CM | POA: Insufficient documentation

## 2014-05-20 NOTE — Progress Notes (Signed)
Cardiac Rehab Medication Review by a Pharmacist  Does the patient  feel that his/her medications are working for him/her?  yes  Has the patient been experiencing any side effects to the medications prescribed?  Yes (some constipation but is tolerable with the stool softener)  Does the patient measure his/her own blood pressure or blood glucose at home?  Not usually but does have a BP cuff to do so   Does the patient have any problems obtaining medications due to transportation or finances?   no  Understanding of regimen: good Understanding of indications: good Potential of compliance: excellent    Pharmacist comments: Allen James describes a good understanding of his medication regimen. His wife is a retired Therapist, sports and assists him with his medications. He describes no issues with his medications at this time.   Allen James, PharmD Clinical Pharmacist-Resident Pager: [phone] Pharmacy: [phone] 05/20/2014 9:13 AM

## 2014-05-24 ENCOUNTER — Encounter (HOSPITAL_COMMUNITY)
Admission: RE | Admit: 2014-05-24 | Discharge: 2014-05-24 | Disposition: A | Discharge status: Home / Self Care | Payer: Medicare HMO | Source: Ambulatory Visit | Attending: Cardiovascular Disease | Admitting: Cardiovascular Disease

## 2014-05-24 ENCOUNTER — Encounter (HOSPITAL_COMMUNITY): Payer: Self-pay

## 2014-05-24 DIAGNOSIS — Z5189 Encounter for other specified aftercare: Secondary | ICD-10-CM | POA: Diagnosis not present

## 2014-05-24 DIAGNOSIS — I214 Non-ST elevation (NSTEMI) myocardial infarction: Secondary | ICD-10-CM | POA: Diagnosis not present

## 2014-05-24 DIAGNOSIS — E785 Hyperlipidemia, unspecified: Secondary | ICD-10-CM | POA: Diagnosis not present

## 2014-05-24 DIAGNOSIS — I2510 Atherosclerotic heart disease of native coronary artery without angina pectoris: Secondary | ICD-10-CM | POA: Diagnosis present

## 2014-05-24 NOTE — Progress Notes (Addendum)
Pt started cardiac rehab today.  Pt tolerated light exercise without difficulty.  VSS, resting and exertional hypertension noted, rest BP:  162/70, peak:  174/76.  Telemetry-NSR, first degree AVB.  PHQ-0.  Pt exhibits no barriers to rehab participation.  Pt demonstrates positive coping skills with supportive family. Pt would like to be able to return to normal activities and resume exercising on his own with confidence at [facility]. Pt is avid golfer, playing 3 times weekly and has already returned to playing golf.  Pt oriented to exercise equipment and routine.  Understanding verbalized.

## 2014-05-26 ENCOUNTER — Encounter (HOSPITAL_COMMUNITY)
Admission: RE | Admit: 2014-05-26 | Discharge: 2014-05-26 | Disposition: A | Discharge status: Home / Self Care | Payer: Medicare HMO | Source: Ambulatory Visit | Attending: Cardiovascular Disease | Admitting: Cardiovascular Disease

## 2014-05-26 DIAGNOSIS — I214 Non-ST elevation (NSTEMI) myocardial infarction: Secondary | ICD-10-CM | POA: Insufficient documentation

## 2014-05-26 DIAGNOSIS — Z9861 Coronary angioplasty status: Secondary | ICD-10-CM | POA: Insufficient documentation

## 2014-05-28 ENCOUNTER — Encounter (HOSPITAL_COMMUNITY): Payer: Medicare HMO

## 2014-05-31 ENCOUNTER — Encounter (HOSPITAL_COMMUNITY): Payer: Medicare HMO

## 2014-06-02 ENCOUNTER — Encounter (HOSPITAL_COMMUNITY): Payer: Medicare HMO

## 2014-06-03 ENCOUNTER — Other Ambulatory Visit (INDEPENDENT_AMBULATORY_CARE_PROVIDER_SITE_OTHER): Payer: Medicare HMO

## 2014-06-03 ENCOUNTER — Telehealth: Payer: Self-pay | Admitting: *Deleted

## 2014-06-03 DIAGNOSIS — I2510 Atherosclerotic heart disease of native coronary artery without angina pectoris: Secondary | ICD-10-CM

## 2014-06-03 DIAGNOSIS — E785 Hyperlipidemia, unspecified: Secondary | ICD-10-CM

## 2014-06-03 LAB — LIPID PANEL
Cholesterol: 100 mg/dL (ref 0–200)
HDL: 39.9 mg/dL (ref >39.00)
LDL Cholesterol: 38 mg/dL (ref 0–99)
NonHDL: 60.1
Total CHOL/HDL Ratio: 3
Triglycerides: 111 mg/dL (ref 0.0–149.0)
VLDL: 22.2 mg/dL (ref 0.0–40.0)

## 2014-06-03 LAB — HEPATIC FUNCTION PANEL
ALT: 23 U/L (ref 0–53)
AST: 22 U/L (ref 0–37)
Albumin: 4.2 g/dL (ref 3.5–5.2)
Alkaline Phosphatase: 71 U/L (ref 39–117)
Bilirubin, Direct: 0.2 mg/dL (ref 0.0–0.3)
Total Bilirubin: 0.9 mg/dL (ref 0.2–1.2)
Total Protein: 7.8 g/dL (ref 6.0–8.3)

## 2014-06-03 NOTE — Telephone Encounter (Signed)
pt notified about normal lab results with verbal understanding 

## 2014-06-04 ENCOUNTER — Encounter (HOSPITAL_COMMUNITY)
Admission: RE | Admit: 2014-06-04 | Discharge: 2014-06-04 | Disposition: A | Discharge status: Home / Self Care | Payer: Medicare HMO | Source: Ambulatory Visit | Attending: Cardiovascular Disease | Admitting: Cardiovascular Disease

## 2014-06-04 DIAGNOSIS — I214 Non-ST elevation (NSTEMI) myocardial infarction: Secondary | ICD-10-CM | POA: Diagnosis not present

## 2014-06-04 NOTE — Progress Notes (Signed)
PSYCHOSOCIAL ASSESSMENT  Pt psychosocial assessment reveals no barriers to rehab participation.  Pt quality of life is slightly altered by his physical constraints which limited  his ability to perform tasks as prior to her illness and stent placement, however pt reports now these symptoms are resolved.   Pt reports  feels significantly better than he did prior to his stent placement.    Pt exhibits positive coping skills and has supportive family.  Offered emotional support and reassurance.  Will continue to monitor.

## 2014-06-07 ENCOUNTER — Encounter (HOSPITAL_COMMUNITY)
Admission: RE | Admit: 2014-06-07 | Discharge: 2014-06-07 | Disposition: A | Discharge status: Home / Self Care | Payer: Medicare HMO | Source: Ambulatory Visit | Attending: Cardiovascular Disease | Admitting: Cardiovascular Disease

## 2014-06-07 DIAGNOSIS — I214 Non-ST elevation (NSTEMI) myocardial infarction: Secondary | ICD-10-CM | POA: Diagnosis not present

## 2014-06-09 ENCOUNTER — Encounter (HOSPITAL_COMMUNITY)
Admission: RE | Admit: 2014-06-09 | Discharge: 2014-06-09 | Disposition: A | Discharge status: Home / Self Care | Payer: Medicare HMO | Source: Ambulatory Visit | Attending: Cardiovascular Disease | Admitting: Cardiovascular Disease

## 2014-06-09 DIAGNOSIS — I214 Non-ST elevation (NSTEMI) myocardial infarction: Secondary | ICD-10-CM | POA: Diagnosis not present

## 2014-06-11 ENCOUNTER — Encounter (HOSPITAL_COMMUNITY)
Admission: RE | Admit: 2014-06-11 | Discharge: 2014-06-11 | Disposition: A | Discharge status: Home / Self Care | Payer: Medicare HMO | Source: Ambulatory Visit | Attending: Cardiovascular Disease | Admitting: Cardiovascular Disease

## 2014-06-11 DIAGNOSIS — I214 Non-ST elevation (NSTEMI) myocardial infarction: Secondary | ICD-10-CM | POA: Diagnosis not present

## 2014-06-14 ENCOUNTER — Encounter (HOSPITAL_COMMUNITY)
Admission: RE | Admit: 2014-06-14 | Discharge: 2014-06-14 | Disposition: A | Discharge status: Home / Self Care | Payer: Medicare HMO | Source: Ambulatory Visit | Attending: Cardiovascular Disease | Admitting: Cardiovascular Disease

## 2014-06-14 DIAGNOSIS — I214 Non-ST elevation (NSTEMI) myocardial infarction: Secondary | ICD-10-CM | POA: Diagnosis not present

## 2014-06-14 NOTE — Progress Notes (Signed)
I have reviewed home exercise with Denyse Amass. The patient was advised to walk 2-4 days per week outside of CRP II for 15 minutes, 2 times per day until he can walk 30 minutes continuously.  Pt will also complete one additional day of hand weights outside of CRP II.  Progression of exercise prescription was discussed.  Reviewed THR, pulse, RPE, sign and symptoms, NTG use and when to call 911 or MD.  Pt voiced understanding.  Archie Endo, MS, ACSM RCEP 06/14/2014 11:53 AM

## 2014-06-16 ENCOUNTER — Encounter (HOSPITAL_COMMUNITY)
Admission: RE | Admit: 2014-06-16 | Discharge: 2014-06-16 | Disposition: A | Discharge status: Home / Self Care | Payer: Medicare HMO | Source: Ambulatory Visit | Attending: Cardiovascular Disease | Admitting: Cardiovascular Disease

## 2014-06-16 DIAGNOSIS — I214 Non-ST elevation (NSTEMI) myocardial infarction: Secondary | ICD-10-CM | POA: Diagnosis not present

## 2014-06-18 ENCOUNTER — Encounter (HOSPITAL_COMMUNITY): Payer: Medicare HMO

## 2014-06-21 ENCOUNTER — Encounter (HOSPITAL_COMMUNITY): Payer: Medicare HMO

## 2014-06-22 ENCOUNTER — Ambulatory Visit (INDEPENDENT_AMBULATORY_CARE_PROVIDER_SITE_OTHER): Payer: Medicare HMO | Admitting: Cardiovascular Disease

## 2014-06-22 ENCOUNTER — Encounter: Payer: Self-pay | Admitting: Cardiovascular Disease

## 2014-06-22 VITALS — BP 144/80 | HR 76 | Ht 72.0 in | Wt 243.0 lb

## 2014-06-22 DIAGNOSIS — I252 Old myocardial infarction: Secondary | ICD-10-CM

## 2014-06-22 DIAGNOSIS — I2510 Atherosclerotic heart disease of native coronary artery without angina pectoris: Secondary | ICD-10-CM

## 2014-06-22 DIAGNOSIS — E785 Hyperlipidemia, unspecified: Secondary | ICD-10-CM

## 2014-06-22 NOTE — Patient Instructions (Signed)
Your physician recommends that you return for a FASTING LIPID, LIVER and BMP one week prior to your appointment with Dr Burt Knack in May (nothing to eat or drink after midnight)--lab opens at 7:30 AM  Your physician wants you to follow-up in: MAY with Dr Burt Knack.  You will receive a reminder letter in the mail two months in advance. If you don't receive a letter, please call our office to schedule the follow-up appointment.  Your physician recommends that you continue on your current medications as directed. Please refer to the Current Medication list given to you today.

## 2014-06-22 NOTE — Progress Notes (Signed)
HPI:  73 year old gentleman presenting for followup evaluation. The patient is followed for coronary artery disease. He presented in May 2015 with non-ST elevation infarction. He was found to have critical stenoses in the right coronary artery treated with drug-eluting stents. He underwent staged PCI in the first OM branch the left circumflex. Medical therapy was recommended for her residual CAD. LVEF was normal by echocardiography. The patient was seen in followup May 28 by Richardson Dopp. He presents today for further followup evaluation. Lipids and LFTs were drawn 06/03/2014. LFTs are within normal limits.  Lipids as below: Lipid Panel     Component Value Date/Time   CHOL 100 06/03/2014 0818   TRIG 111.0 06/03/2014 0818   HDL 39.90 06/03/2014 0818   CHOLHDL 3 06/03/2014 0818   VLDL 22.2 06/03/2014 0818   LDLCALC 38 06/03/2014 0818   The patient is doing well at present. He continues to participate in cardiac rehabilitation. He denies chest pain, chest pressure, dyspnea, or leg swelling. He's tolerating his medications. He does note very easy bruising. No other complaints today.  Outpatient Encounter Prescriptions as of 06/22/2014  Medication Sig  . aspirin EC 81 MG tablet Take 81 mg by mouth daily.  Marland Kitchen atorvastatin (LIPITOR) 80 MG tablet Take 80 mg by mouth daily at 6 PM.  . docusate sodium (COLACE) 250 MG capsule Take 250 mg by mouth daily.  Marland Kitchen HYDROcodone-acetaminophen (NORCO/VICODIN) 5-325 MG per tablet Take 1 tablet by mouth 2 (two) times daily as needed for moderate pain.   . metoprolol succinate (TOPROL-XL) 25 MG 24 hr tablet Take 25 mg by mouth daily.  . nitroGLYCERIN (NITROSTAT) 0.4 MG SL tablet Place 0.4 mg under the tongue every 5 (five) minutes as needed for chest pain.  . ticagrelor (BRILINTA) 90 MG TABS tablet Take 90 mg by mouth 2 (two) times daily.  . traMADol (ULTRAM) 50 MG tablet Take 100 mg by mouth 3 (three) times daily.  . [DISCONTINUED] atorvastatin (LIPITOR) 80 MG tablet Take 1  tablet (80 mg total) by mouth daily at 6 PM.  . [DISCONTINUED] metoprolol succinate (TOPROL XL) 25 MG 24 hr tablet Take 1 tablet (25 mg total) by mouth daily.  . [DISCONTINUED] nitroGLYCERIN (NITROSTAT) 0.4 MG SL tablet Place 1 tablet (0.4 mg total) under the tongue every 5 (five) minutes as needed for chest pain.  . [DISCONTINUED] ticagrelor (BRILINTA) 90 MG TABS tablet Take 1 tablet (90 mg total) by mouth 2 (two) times daily.    No Known Allergies  Past Medical History  Diagnosis Date  . Back pain   . OSA on CPAP   . CAD (coronary artery disease)     a. NSTEMI 04/12/14 s/p DES x 2 to RCA. Prox 3.5x38 mm Xience Alpine DES, Dist 3.5x28 mm Xience DES; EF 65-70%. Staged PCI to the OM1 with 2.75 x 12 mm Resolute DES     . GERD (gastroesophageal reflux disease)   . H/O hiatal hernia   . Arthritis   . Hyperlipidemia   . Abnormal findings on cardiac catheterization     ! Do NOT use RIGHT radial access on future caths or PCI.   ROS: Negative except as per HPI  BP 144/80  Pulse 76  Ht 6' (1.829 m)  Wt 243 lb (110.224 kg)  BMI 32.95 kg/m2  PHYSICAL EXAM: Pt is alert and oriented, NAD HEENT: normal Neck: JVP - normal, carotids 2+= without bruits Lungs: CTA bilaterally CV: RRR without murmur or gallop Abd: soft, NT, Positive  BS, no hepatomegaly Ext: no C/C/E, distal pulses intact and equal Skin: warm/dry no rash  2-D echo: Study Conclusions  - Left ventricle: The cavity size was normal. Wall thickness was normal. Systolic function was vigorous. The estimated ejection fraction was in the range of 65% to 70%. Wall motion was normal; there were no regional wall motion abnormalities. Doppler parameters are consistent with abnormal left ventricular relaxation (grade 1 diastolic dysfunction). - Left atrium: The atrium was moderately dilated.  ASSESSMENT AND PLAN: 1. Coronary artery disease, native vessel. Non-ST elevation infarction approximately 2 months ago with multivessel PCI as  outlined above. The patient is tolerating dual antiplatelet therapy with aspirin and brilinta. Will continue the same. I will see him back in May 2016 when he is 12 months out from his non-STEMI.  2. Hyperlipidemia. Patient takes atorvastatin at high dose. Lipids reviewed as above. Will continue decrease of atorvastatin dose when he is out to one year based on very low cholesterol readings from recent labs. I will repeat lipids and LFTs prior to his next office visit.  Sherren Mocha 06/22/2014 11:48 AM

## 2014-06-23 ENCOUNTER — Encounter (HOSPITAL_COMMUNITY)
Admission: RE | Admit: 2014-06-23 | Discharge: 2014-06-23 | Disposition: A | Discharge status: Home / Self Care | Payer: Medicare HMO | Source: Ambulatory Visit | Attending: Cardiovascular Disease | Admitting: Cardiovascular Disease

## 2014-06-23 DIAGNOSIS — I214 Non-ST elevation (NSTEMI) myocardial infarction: Secondary | ICD-10-CM | POA: Diagnosis not present

## 2014-06-25 ENCOUNTER — Encounter (HOSPITAL_COMMUNITY)
Admission: RE | Admit: 2014-06-25 | Discharge: 2014-06-25 | Disposition: A | Discharge status: Home / Self Care | Payer: Medicare HMO | Source: Ambulatory Visit | Attending: Cardiovascular Disease | Admitting: Cardiovascular Disease

## 2014-06-25 DIAGNOSIS — I214 Non-ST elevation (NSTEMI) myocardial infarction: Secondary | ICD-10-CM | POA: Diagnosis not present

## 2014-06-25 NOTE — Progress Notes (Signed)
Allen James 73 y.o. male Nutrition Note Spoke with pt.  Nutrition Plan and Nutrition Survey goals reviewed with pt. Pt is working toward following the Therapeutic Lifestyle Changes diet. Pt wants to lose wt. Pt reports he weighed 285 lb in 02/2014. Pt has cut out sodas, in-between meal snacks, and fried foods. Pt wt today 109.5 kg (240.9 lb). Pt has lost 44.1 lb over the past 3 to 4 months. Rate of wt loss exceeds desired rate. Per discussion, pt is eating a toaster strudel for breakfast, an apple for lunch, and "a big dinner." Given rapid rate of wt loss, feel pt is not consuming enough calories. Consuming more balanced meals discussed. Wt loss tips reviewed.  Pt aware of nutrition education classes offered and plans on attending nutrition classes. Vitals - 1 value per visit 06/22/2014 05/20/2014 04/22/2014 04/15/2014  Weight (lb) 243 248.46 245 252.87   Nutrition Diagnosis   Food-and nutrition-related knowledge deficit related to lack of exposure to information as related to diagnosis of: ? CVD ?    Obesity related to excessive energy intake as evidenced by a BMI of 34.4  Nutrition RX/ Estimated Daily Nutrition Needs for: wt loss  1600-2100 Kcal, 45-55 gm fat, 10-16 gm sat fat, 1.6-2.1 gm trans-fat, <1500 mg sodium  Nutrition Intervention   Pt's individual nutrition plan reviewed with pt.   Benefits of adopting Therapeutic Lifestyle Changes discussed when Medficts reviewed.   Pt to attend the Portion Distortion class   Pt to attend the  ? Nutrition I class                     ? Nutrition II class   Pt given handouts for: ? Nutrition I class ? Nutrition II class    Continue client-centered nutrition education by RD, as part of interdisciplinary care. Goal(s)   Pt to identify and limit food sources of saturated fat, trans fat, and cholesterol   Pt to identify food quantities necessary to achieve: ? wt loss to a goal wt of 224-242 lb (101.8-110 kg) at graduation from cardiac rehab.  Monitor  and Evaluate progress toward nutrition goal with team. Nutrition Risk: Low Derek Mound, M.Ed, RD, LDN, CDE 06/25/2014 9:34 AM

## 2014-06-28 ENCOUNTER — Encounter (HOSPITAL_COMMUNITY): Payer: Medicare HMO

## 2014-06-30 ENCOUNTER — Encounter (HOSPITAL_COMMUNITY): Payer: Medicare HMO

## 2014-07-02 ENCOUNTER — Encounter (HOSPITAL_COMMUNITY): Payer: Medicare HMO

## 2014-07-05 ENCOUNTER — Encounter (HOSPITAL_COMMUNITY): Payer: Medicare HMO

## 2014-07-07 ENCOUNTER — Encounter (HOSPITAL_COMMUNITY)
Admission: RE | Admit: 2014-07-07 | Discharge: 2014-07-07 | Disposition: A | Discharge status: Home / Self Care | Payer: Medicare HMO | Source: Ambulatory Visit | Attending: Cardiovascular Disease | Admitting: Cardiovascular Disease

## 2014-07-07 ENCOUNTER — Telehealth: Payer: Self-pay | Admitting: Cardiovascular Disease

## 2014-07-07 DIAGNOSIS — I214 Non-ST elevation (NSTEMI) myocardial infarction: Secondary | ICD-10-CM | POA: Insufficient documentation

## 2014-07-07 DIAGNOSIS — Z9861 Coronary angioplasty status: Secondary | ICD-10-CM | POA: Insufficient documentation

## 2014-07-07 NOTE — Telephone Encounter (Signed)
Walk In pt Form " paperwork " Dropped off Lauren back Thursday

## 2014-07-09 ENCOUNTER — Encounter (HOSPITAL_COMMUNITY)
Admission: RE | Admit: 2014-07-09 | Discharge: 2014-07-09 | Disposition: A | Discharge status: Home / Self Care | Payer: Medicare HMO | Source: Ambulatory Visit | Attending: Cardiovascular Disease | Admitting: Cardiovascular Disease

## 2014-07-09 DIAGNOSIS — I214 Non-ST elevation (NSTEMI) myocardial infarction: Secondary | ICD-10-CM | POA: Diagnosis not present

## 2014-07-12 ENCOUNTER — Encounter (HOSPITAL_COMMUNITY)
Admission: RE | Admit: 2014-07-12 | Discharge: 2014-07-12 | Disposition: A | Discharge status: Home / Self Care | Payer: Medicare HMO | Source: Ambulatory Visit | Attending: Cardiovascular Disease | Admitting: Cardiovascular Disease

## 2014-07-12 DIAGNOSIS — I214 Non-ST elevation (NSTEMI) myocardial infarction: Secondary | ICD-10-CM | POA: Diagnosis not present

## 2014-07-13 DIAGNOSIS — M545 Low back pain, unspecified: Secondary | ICD-10-CM | POA: Insufficient documentation

## 2014-07-13 DIAGNOSIS — G8929 Other chronic pain: Secondary | ICD-10-CM

## 2014-07-13 HISTORY — DX: Low back pain, unspecified: M54.50

## 2014-07-13 HISTORY — DX: Other chronic pain: G89.29

## 2014-07-14 ENCOUNTER — Telehealth: Payer: Self-pay | Admitting: Cardiovascular Disease

## 2014-07-14 ENCOUNTER — Encounter (HOSPITAL_COMMUNITY)
Admission: RE | Admit: 2014-07-14 | Discharge: 2014-07-14 | Disposition: A | Discharge status: Home / Self Care | Payer: Medicare HMO | Source: Ambulatory Visit | Attending: Cardiovascular Disease | Admitting: Cardiovascular Disease

## 2014-07-14 DIAGNOSIS — I214 Non-ST elevation (NSTEMI) myocardial infarction: Secondary | ICD-10-CM | POA: Diagnosis not present

## 2014-07-14 NOTE — Telephone Encounter (Signed)
New Message    Patient wanted to check on the status of paper work and also specify that the stints were put in due to a heart attack.-KG

## 2014-07-15 NOTE — Telephone Encounter (Signed)
I spoke with the pt and he will pick up form on 07/16/14.

## 2014-07-16 ENCOUNTER — Encounter (HOSPITAL_COMMUNITY)
Admission: RE | Admit: 2014-07-16 | Discharge: 2014-07-16 | Disposition: A | Discharge status: Home / Self Care | Payer: Medicare HMO | Source: Ambulatory Visit | Attending: Cardiovascular Disease | Admitting: Cardiovascular Disease

## 2014-07-16 DIAGNOSIS — I214 Non-ST elevation (NSTEMI) myocardial infarction: Secondary | ICD-10-CM | POA: Diagnosis not present

## 2014-07-19 ENCOUNTER — Encounter (HOSPITAL_COMMUNITY)
Admission: RE | Admit: 2014-07-19 | Discharge: 2014-07-19 | Disposition: A | Discharge status: Home / Self Care | Payer: Medicare HMO | Source: Ambulatory Visit | Attending: Cardiovascular Disease | Admitting: Cardiovascular Disease

## 2014-07-19 DIAGNOSIS — I214 Non-ST elevation (NSTEMI) myocardial infarction: Secondary | ICD-10-CM | POA: Diagnosis not present

## 2014-07-21 ENCOUNTER — Encounter (HOSPITAL_COMMUNITY)
Admission: RE | Admit: 2014-07-21 | Discharge: 2014-07-21 | Disposition: A | Discharge status: Home / Self Care | Payer: Medicare HMO | Source: Ambulatory Visit | Attending: Cardiovascular Disease | Admitting: Cardiovascular Disease

## 2014-07-21 DIAGNOSIS — I214 Non-ST elevation (NSTEMI) myocardial infarction: Secondary | ICD-10-CM | POA: Diagnosis not present

## 2014-07-23 ENCOUNTER — Encounter (HOSPITAL_COMMUNITY): Payer: Medicare HMO

## 2014-07-26 ENCOUNTER — Encounter (HOSPITAL_COMMUNITY): Payer: Medicare HMO

## 2014-07-28 ENCOUNTER — Encounter (HOSPITAL_COMMUNITY): Payer: Medicare HMO

## 2014-07-30 ENCOUNTER — Encounter (HOSPITAL_COMMUNITY): Admission: RE | Admit: 2014-07-30 | Payer: Medicare HMO | Source: Ambulatory Visit

## 2014-08-04 ENCOUNTER — Encounter (HOSPITAL_COMMUNITY): Payer: Medicare HMO

## 2014-08-06 ENCOUNTER — Encounter (HOSPITAL_COMMUNITY): Payer: Medicare HMO

## 2014-08-09 ENCOUNTER — Encounter (HOSPITAL_COMMUNITY): Payer: Medicare HMO

## 2014-08-11 ENCOUNTER — Encounter (HOSPITAL_COMMUNITY): Payer: Medicare HMO

## 2014-08-13 ENCOUNTER — Encounter (HOSPITAL_COMMUNITY): Admission: RE | Admit: 2014-08-13 | Payer: Medicare HMO | Source: Ambulatory Visit

## 2014-08-16 ENCOUNTER — Encounter (HOSPITAL_COMMUNITY): Admission: RE | Admit: 2014-08-16 | Payer: Medicare HMO | Source: Ambulatory Visit

## 2014-08-18 ENCOUNTER — Encounter (HOSPITAL_COMMUNITY): Payer: Medicare HMO

## 2014-08-19 ENCOUNTER — Encounter (HOSPITAL_COMMUNITY): Payer: Self-pay | Admitting: Cardiac Rehabilitation

## 2014-08-19 NOTE — Progress Notes (Signed)
Pt exited from cardiac rehab program today with completion of 17 exercise sessions in Phase II. Pt had frequent travel related absences and ultimately decided to exercise on his own at the [facility].  Dr. Burt Knack made aware.  Letter mailed to pt requesting return to parking badge.  Thank you for the referral.

## 2014-08-20 ENCOUNTER — Encounter (HOSPITAL_COMMUNITY): Payer: Medicare HMO

## 2014-08-23 ENCOUNTER — Other Ambulatory Visit: Payer: Self-pay

## 2014-08-23 ENCOUNTER — Encounter (HOSPITAL_COMMUNITY): Payer: Medicare HMO

## 2014-08-23 MED ORDER — ATORVASTATIN CALCIUM 80 MG PO TABS: 80.0000 mg | ORAL_TABLET | Freq: Every day | ORAL | Status: DC

## 2014-08-25 ENCOUNTER — Encounter (HOSPITAL_COMMUNITY): Payer: Medicare HMO

## 2014-08-27 ENCOUNTER — Encounter (HOSPITAL_COMMUNITY): Payer: Medicare HMO

## 2014-08-30 ENCOUNTER — Ambulatory Visit (HOSPITAL_COMMUNITY): Payer: Medicare HMO

## 2014-09-01 ENCOUNTER — Ambulatory Visit (HOSPITAL_COMMUNITY): Payer: Medicare HMO

## 2014-09-03 ENCOUNTER — Ambulatory Visit (HOSPITAL_COMMUNITY): Payer: Medicare HMO

## 2014-09-06 ENCOUNTER — Ambulatory Visit (HOSPITAL_COMMUNITY): Payer: Medicare HMO

## 2014-09-08 ENCOUNTER — Ambulatory Visit (HOSPITAL_COMMUNITY): Payer: Medicare HMO

## 2014-09-10 ENCOUNTER — Ambulatory Visit (HOSPITAL_COMMUNITY): Payer: Medicare HMO

## 2014-09-13 ENCOUNTER — Ambulatory Visit (HOSPITAL_COMMUNITY): Payer: Medicare HMO

## 2014-09-15 ENCOUNTER — Ambulatory Visit (HOSPITAL_COMMUNITY): Payer: Medicare HMO

## 2014-09-17 ENCOUNTER — Ambulatory Visit (HOSPITAL_COMMUNITY): Payer: Medicare HMO

## 2014-09-20 ENCOUNTER — Ambulatory Visit (HOSPITAL_COMMUNITY): Payer: Medicare HMO

## 2014-09-22 ENCOUNTER — Ambulatory Visit (HOSPITAL_COMMUNITY): Payer: Medicare HMO

## 2014-09-24 ENCOUNTER — Ambulatory Visit (HOSPITAL_COMMUNITY): Payer: Medicare HMO

## 2014-09-30 DIAGNOSIS — M1990 Unspecified osteoarthritis, unspecified site: Secondary | ICD-10-CM

## 2014-09-30 HISTORY — DX: Unspecified osteoarthritis, unspecified site: M19.90

## 2014-10-12 ENCOUNTER — Telehealth: Payer: Self-pay | Admitting: Cardiovascular Disease

## 2014-10-12 NOTE — Telephone Encounter (Signed)
New message     Patient need extraction . Need cardiac clearance .

## 2014-10-12 NOTE — Telephone Encounter (Signed)
I will fax this note to Dr Francia Greaves office.

## 2014-10-12 NOTE — Telephone Encounter (Signed)
Reviewed cath notes. Pt was treated with multiple stents. I think best to do extraction on Brilinta if possible. Would see oral surgeon.

## 2014-10-12 NOTE — Telephone Encounter (Signed)
I spoke with Allen James and she said the pt needs three teeth extracted all at the same time. The extraction is not scheduled at this time. She is asking if the pt is cleared from a cardiac stand point and if Brilinta can be held prior to procedure. I made her aware that the pt had an MI in May 2015 and had drug eluting stents placed. I made her aware that due to recent stent placement we normally do not allow the pt to stop anti-platelet drug until 1 year from event. She said that if the pt cannot stop Brilinta then they will need to refer the pt to an oral surgeon. I made her aware that I will forward this message to Dr Burt Knack to review and give further recommendations. Once Dr Burt Knack responds I will fax his note to Dr Francia Greaves office.

## 2014-10-14 ENCOUNTER — Telehealth: Payer: Self-pay | Admitting: Cardiovascular Disease

## 2014-10-14 NOTE — Telephone Encounter (Signed)
Received request from Nurse fax box:   To: Dr. Deanna Artis Fax number: [phone] Attention:Amanda

## 2014-10-25 ENCOUNTER — Other Ambulatory Visit: Payer: Self-pay

## 2014-10-25 MED ORDER — ATORVASTATIN CALCIUM 80 MG PO TABS: 80.0000 mg | ORAL_TABLET | Freq: Every day | ORAL | Status: DC

## 2014-10-25 MED ORDER — METOPROLOL SUCCINATE ER 25 MG PO TB24: 25.0000 mg | ORAL_TABLET | Freq: Every day | ORAL | Status: DC

## 2014-11-04 ENCOUNTER — Encounter (HOSPITAL_COMMUNITY): Payer: Self-pay | Admitting: Cardiovascular Disease

## 2014-12-28 DIAGNOSIS — G4733 Obstructive sleep apnea (adult) (pediatric): Secondary | ICD-10-CM

## 2014-12-28 DIAGNOSIS — E7800 Pure hypercholesterolemia, unspecified: Secondary | ICD-10-CM

## 2014-12-28 HISTORY — DX: Pure hypercholesterolemia, unspecified: E78.00

## 2014-12-28 HISTORY — DX: Obstructive sleep apnea (adult) (pediatric): G47.33

## 2015-04-06 ENCOUNTER — Other Ambulatory Visit: Payer: Self-pay | Admitting: Physician Assistant

## 2015-05-09 ENCOUNTER — Other Ambulatory Visit (INDEPENDENT_AMBULATORY_CARE_PROVIDER_SITE_OTHER): Payer: Medicare HMO | Admitting: *Deleted

## 2015-05-09 DIAGNOSIS — I2510 Atherosclerotic heart disease of native coronary artery without angina pectoris: Secondary | ICD-10-CM

## 2015-05-09 DIAGNOSIS — E785 Hyperlipidemia, unspecified: Secondary | ICD-10-CM

## 2015-05-09 DIAGNOSIS — I252 Old myocardial infarction: Secondary | ICD-10-CM

## 2015-05-09 LAB — BASIC METABOLIC PANEL
BUN: 13 mg/dL (ref 6–23)
CO2: 26 mEq/L (ref 19–32)
Calcium: 9 mg/dL (ref 8.4–10.5)
Chloride: 106 mEq/L (ref 96–112)
Creatinine, Ser: 0.89 mg/dL (ref 0.40–1.50)
GFR: 89.05 mL/min (ref >60.00)
Glucose, Bld: 103 mg/dL — ABNORMAL HIGH (ref 70–99)
Potassium: 3.7 mEq/L (ref 3.5–5.1)
Sodium: 138 mEq/L (ref 135–145)

## 2015-05-09 LAB — HEPATIC FUNCTION PANEL
ALT: 16 U/L (ref 0–53)
AST: 17 U/L (ref 0–37)
Albumin: 4 g/dL (ref 3.5–5.2)
Alkaline Phosphatase: 57 U/L (ref 39–117)
Bilirubin, Direct: 0.1 mg/dL (ref 0.0–0.3)
Total Bilirubin: 0.5 mg/dL (ref 0.2–1.2)
Total Protein: 7.1 g/dL (ref 6.0–8.3)

## 2015-05-09 LAB — LIPID PANEL
Cholesterol: 129 mg/dL (ref 0–200)
HDL: 41.5 mg/dL (ref >39.00)
LDL Cholesterol: 70 mg/dL (ref 0–99)
NonHDL: 87.5
Total CHOL/HDL Ratio: 3
Triglycerides: 88 mg/dL (ref 0.0–149.0)
VLDL: 17.6 mg/dL (ref 0.0–40.0)

## 2015-05-09 NOTE — Addendum Note (Signed)
Addended by: Eulis Foster on: 05/09/2015 09:23 AM   Modules accepted: Orders

## 2015-05-16 ENCOUNTER — Encounter: Payer: Self-pay | Admitting: Cardiovascular Disease

## 2015-05-16 ENCOUNTER — Ambulatory Visit (INDEPENDENT_AMBULATORY_CARE_PROVIDER_SITE_OTHER): Payer: Medicare HMO | Admitting: Cardiovascular Disease

## 2015-05-16 VITALS — BP 122/76 | HR 76 | Ht 72.0 in | Wt 266.0 lb

## 2015-05-16 DIAGNOSIS — I2510 Atherosclerotic heart disease of native coronary artery without angina pectoris: Secondary | ICD-10-CM | POA: Diagnosis not present

## 2015-05-16 DIAGNOSIS — E785 Hyperlipidemia, unspecified: Secondary | ICD-10-CM

## 2015-05-16 NOTE — Patient Instructions (Signed)
Medication Instructions:  Your physician has recommended you make the following change in your medication:  1. STOP Brilinta  Labwork: Your physician recommends that you return for a FASTING LIPID, LIVER and BMP in 1 YEAR--nothing to eat or drink after midnight, lab opens at 7:30 AM.   Testing/Procedures: No new orders.   Follow-Up: Your physician wants you to follow-up in: 1 YEAR with Dr Burt Knack.  You will receive a reminder letter in the mail two months in advance. If you don't receive a letter, please call our office to schedule the follow-up appointment.   Any Other Special Instructions Will Be Listed Below (If Applicable).

## 2015-05-16 NOTE — Progress Notes (Signed)
Cardiology Office Note   Date:  05/16/2015   ID:  Allen James, DOB 28-Nov-1940, MRN [id number]  PCP:  Nilda Simmer, MD  Cardiologist:  Sherren Mocha, MD    Chief Complaint  Patient presents with  . Coronary Artery Disease    History of Present Illness: Allen James is a 74 y.o. male who presents for follow-up evaluation.   The patient is followed for coronary artery disease. He presented in May 2015 with non-ST elevation infarction. He was found to have critical stenoses in the right coronary artery treated with drug-eluting stents. He underwent staged PCI in the first OM branch the left circumflex. Medical therapy was recommended for her residual CAD. LVEF was normal by echocardiography.   the patient is feeling well. He denies chest pain, chest pressure, heart palpitations, orthopnea, or PND. His exertional symptoms have completely resolved since he underwent PCI. He has some problems with joint pain and would like to start taking something for that.  Past Medical History  Diagnosis Date  . Back pain   . OSA on CPAP   . CAD (coronary artery disease)     a. NSTEMI 04/12/14 s/p DES x 2 to RCA. Prox 3.5x38 mm Xience Alpine DES, Dist 3.5x28 mm Xience DES; EF 65-70%. Staged PCI to the OM1 with 2.75 x 12 mm Resolute DES     . GERD (gastroesophageal reflux disease)   . H/O hiatal hernia   . Arthritis   . Hyperlipidemia   . Abnormal findings on cardiac catheterization     ! Do NOT use RIGHT radial access on future caths or PCI.    Past Surgical History  Procedure Laterality Date  . Appendectomy      Remote  . Coronary angioplasty  04/12/14    STENT TO RCA  . Knee arthroscopy Bilateral   . Carpal tunnel release Bilateral   . Left heart catheterization with coronary angiogram N/A 04/12/2014    Procedure: LEFT HEART CATHETERIZATION WITH CORONARY ANGIOGRAM;  Surgeon: Blane Ohara, MD;  Location: [facility] CATH LAB;  Service: Cardiovascular;  Laterality: N/A;  . Percutaneous  coronary stent intervention (pci-s)  04/12/2014    Procedure: PERCUTANEOUS CORONARY STENT INTERVENTION (PCI-S);  Surgeon: Blane Ohara, MD;  Location: [facility] CATH LAB;  Service: Cardiovascular;;  . Percutaneous coronary stent intervention (pci-s) N/A 04/14/2014    Procedure: PERCUTANEOUS CORONARY STENT INTERVENTION (PCI-S);  Surgeon: Peter M Martinique, MD;  Location: [facility] CATH LAB;  Service: Cardiovascular;  Laterality: N/A;    Current Outpatient Prescriptions  Medication Sig Dispense Refill  . acetaminophen (TYLENOL) 100 MG/ML solution Take 10 mg/kg by mouth 3 (three) times daily.    Marland Kitchen aspirin EC 81 MG tablet Take 81 mg by mouth daily.    Marland Kitchen atorvastatin (LIPITOR) 80 MG tablet Take 1 tablet (80 mg total) by mouth daily at 6 PM. 90 tablet 3  . docusate sodium (COLACE) 250 MG capsule Take 250 mg by mouth daily.    Marland Kitchen HYDROcodone-acetaminophen (NORCO/VICODIN) 5-325 MG per tablet Take 1 tablet by mouth 2 (two) times daily as needed for moderate pain.     . metoprolol succinate (TOPROL-XL) 25 MG 24 hr tablet Take 1 tablet (25 mg total) by mouth daily. 90 tablet 3  . nitroGLYCERIN (NITROSTAT) 0.4 MG SL tablet Place 0.4 mg under the tongue every 5 (five) minutes as needed for chest pain.    . traMADol (ULTRAM) 50 MG tablet Take 100 mg by mouth 3 (three) times daily.  No current facility-administered medications for this visit.    Allergies:   Review of patient's allergies indicates no known allergies.   Social History:  The patient  reports that he quit smoking about 32 years ago. He has never used smokeless tobacco. He reports that he does not drink alcohol or use illicit drugs.   Family History:  The patient's  family history includes Coronary artery disease (age of onset: 33) in his mother.    ROS:  Please see the history of present illness.  Otherwise, review of systems is positive for  Exertional dyspnea, diarrhea, back pain, muscle pain, easy bruising, bleeding.  All other systems are reviewed  and negative.    PHYSICAL EXAM: VS:  BP 122/76 mmHg  Pulse 76  Ht 6' (1.829 m)  Wt 266 lb (120.657 kg)  BMI 36.07 kg/m2 , BMI Body mass index is 36.07 kg/(m^2). GEN: Well nourished, well developed, in no acute distress HEENT: normal Neck: no JVD, no masses. No carotid bruits Cardiac: RRR without murmur or gallop                Respiratory:  clear to auscultation bilaterally, normal work of breathing GI: soft, nontender, nondistended, + BS MS: no deformity or atrophy Ext: no pretibial edema, pedal pulses 2+= bilaterally Skin: warm and dry, no rash Neuro:  Strength and sensation are intact Psych: euthymic mood, full affect  EKG:  EKG is ordered today. The ekg ordered today shows  Normal sinus rhythm 76 bpm, within normal limits.  Recent Labs: 05/09/2015: ALT 16; BUN 13; Creatinine, Ser 0.89; Potassium 3.7; Sodium 138   Lipid Panel     Component Value Date/Time   CHOL 129 05/09/2015 0923   TRIG 88.0 05/09/2015 0923   HDL 41.50 05/09/2015 0923   CHOLHDL 3 05/09/2015 0923   VLDL 17.6 05/09/2015 0923   LDLCALC 70 05/09/2015 0923      Wt Readings from Last 3 Encounters:  05/16/15 266 lb (120.657 kg)  06/22/14 243 lb (110.224 kg)  05/20/14 248 lb 7.3 oz (112.7 kg)     Cardiac Studies Reviewed: 2D Echo 04/13/2014: Study Conclusions  - Left ventricle: The cavity size was normal. Wall thickness was normal. Systolic function was vigorous. The estimated ejection fraction was in the range of 65% to 70%. Wall motion was normal; there were no regional wall motion abnormalities. Doppler parameters are consistent with abnormal left ventricular relaxation (grade 1 diastolic dysfunction). - Left atrium: The atrium was moderately dilated.  ASSESSMENT AND PLAN: 1.   CAD, native vessel : The patient is stable without symptoms of angina. He is greater than 12 months out from PCI. We reviewed the pros and cons of long-term dual antiplatelets therapy, and I think it is  reasonable to discontinue brilinta at this point. He will remain on aspirin 81 mg daily. Will continue his risk reduction program without further changes.  2. Hyperlipidemia: The patient continues on high-dose atorvastatin. Lipids are excellent. No changes were made today. Will repeat labs in one year prior to his return visit.   3. Osteoarthritis: He has significant limitation related to pain. We talked about different options. He will try Tylenol and he understands the potential cardiovascular issues related to non-steroidal anti-inflammatory drugs.  Current medicines are reviewed with the patient today.  The patient does not have concerns regarding medicines.  Labs/ tests ordered today include:   Orders Placed This Encounter  Procedures  . Lipid panel  . Hepatic function panel  . Basic  Metabolic Panel (BMET)  . EKG 12-Lead    Disposition:   FU one year  Signed, Sherren Mocha, MD  05/16/2015 1:35 PM    [facility] [address], [place], [place]  [address] Phone: [phone]; Fax: [phone]

## 2015-05-23 ENCOUNTER — Other Ambulatory Visit: Payer: Self-pay

## 2015-06-03 ENCOUNTER — Other Ambulatory Visit: Payer: Self-pay | Admitting: Cardiovascular Disease

## 2015-12-29 ENCOUNTER — Other Ambulatory Visit: Payer: Self-pay | Admitting: Cardiovascular Disease

## 2016-03-07 DIAGNOSIS — R1013 Epigastric pain: Secondary | ICD-10-CM

## 2016-03-07 DIAGNOSIS — F5101 Primary insomnia: Secondary | ICD-10-CM | POA: Insufficient documentation

## 2016-03-07 HISTORY — DX: Epigastric pain: R10.13

## 2016-03-07 HISTORY — DX: Primary insomnia: F51.01

## 2016-04-16 ENCOUNTER — Emergency Department: Admission: EM | Admit: 2016-04-16 | Discharge: 2016-04-16 | Discharge status: Absent without leave | Payer: Medicare HMO

## 2016-05-02 ENCOUNTER — Encounter: Payer: Self-pay | Admitting: Cardiovascular Disease

## 2016-05-03 ENCOUNTER — Other Ambulatory Visit (INDEPENDENT_AMBULATORY_CARE_PROVIDER_SITE_OTHER): Payer: Medicare HMO | Admitting: *Deleted

## 2016-05-03 DIAGNOSIS — E785 Hyperlipidemia, unspecified: Secondary | ICD-10-CM

## 2016-05-03 DIAGNOSIS — I2510 Atherosclerotic heart disease of native coronary artery without angina pectoris: Secondary | ICD-10-CM | POA: Diagnosis not present

## 2016-05-03 LAB — LIPID PANEL
Cholesterol: 183 mg/dL (ref 125–200)
HDL: 39 mg/dL — ABNORMAL LOW (ref >=40)
LDL Cholesterol: 115 mg/dL (ref <130)
Total CHOL/HDL Ratio: 4.7 Ratio (ref <=5.0)
Triglycerides: 147 mg/dL (ref <150)
VLDL: 29 mg/dL (ref <30)

## 2016-05-03 LAB — BASIC METABOLIC PANEL
BUN: 19 mg/dL (ref 7–25)
CO2: 24 mmol/L (ref 20–31)
Calcium: 8.7 mg/dL (ref 8.6–10.3)
Chloride: 106 mmol/L (ref 98–110)
Creat: 1.05 mg/dL (ref 0.70–1.18)
Glucose, Bld: 106 mg/dL — ABNORMAL HIGH (ref 65–99)
Potassium: 4 mmol/L (ref 3.5–5.3)
Sodium: 139 mmol/L (ref 135–146)

## 2016-05-03 LAB — HEPATIC FUNCTION PANEL
ALT: 19 U/L (ref 9–46)
AST: 17 U/L (ref 10–35)
Albumin: 3.9 g/dL (ref 3.6–5.1)
Alkaline Phosphatase: 55 U/L (ref 40–115)
Bilirubin, Direct: 0.1 mg/dL (ref <=0.2)
Indirect Bilirubin: 0.3 mg/dL (ref 0.2–1.2)
Total Bilirubin: 0.4 mg/dL (ref 0.2–1.2)
Total Protein: 6.6 g/dL (ref 6.1–8.1)

## 2016-05-03 NOTE — Addendum Note (Signed)
Addended by: Eulis Foster on: 05/03/2016 08:59 AM   Modules accepted: Orders

## 2016-05-03 NOTE — Addendum Note (Signed)
Addended by: Eulis Foster on: 05/03/2016 08:58 AM   Modules accepted: Orders

## 2016-05-07 ENCOUNTER — Encounter: Payer: Self-pay | Admitting: Cardiovascular Disease

## 2016-05-07 ENCOUNTER — Ambulatory Visit (INDEPENDENT_AMBULATORY_CARE_PROVIDER_SITE_OTHER): Payer: Medicare HMO | Admitting: Cardiovascular Disease

## 2016-05-07 VITALS — BP 136/80 | Ht 72.0 in | Wt 260.0 lb

## 2016-05-07 DIAGNOSIS — I2510 Atherosclerotic heart disease of native coronary artery without angina pectoris: Secondary | ICD-10-CM

## 2016-05-07 MED ORDER — LOVASTATIN 20 MG PO TABS
20.0000 mg | ORAL_TABLET | Freq: Every day | ORAL | Status: DC
Start: 2016-05-07 — End: 2018-06-14

## 2016-05-07 NOTE — Progress Notes (Signed)
Cardiology Office Note Date:  05/07/2016   ID:  Allen James, DOB 01/13/1941, MRN [id number]  PCP:  Nilda Simmer, MD  Cardiologist:  Sherren Mocha, MD    No chief complaint on file.  History of Present Illness: Allen James is a 75 y.o. male who presents for follow-up evaluation.   The patient is followed for coronary artery disease. He presented in May 2015 with non-ST elevation infarction. He was found to have critical stenoses in the right coronary artery treated with drug-eluting stents. He underwent staged PCI in the first OM branch the left circumflex. Medical therapy was recommended for her residual CAD. LVEF was normal by echocardiography.  The patient is doing well. He has mild shortness of breath with activity, otherwise no symptoms. Today, he denies symptoms of palpitations, chest pain, orthopnea, PND, lower extremity edema, dizziness, or syncope. He remains relatively active, golfing and doing work in his yard.  Past Medical History  Diagnosis Date  . Back pain   . OSA on CPAP   . CAD (coronary artery disease)     a. NSTEMI 04/12/14 s/p DES x 2 to RCA. Prox 3.5x38 mm Xience Alpine DES, Dist 3.5x28 mm Xience DES; EF 65-70%. Staged PCI to the OM1 with 2.75 x 12 mm Resolute DES     . GERD (gastroesophageal reflux disease)   . H/O hiatal hernia   . Arthritis   . Hyperlipidemia   . Abnormal findings on cardiac catheterization     ! Do NOT use RIGHT radial access on future caths or PCI.    Past Surgical History  Procedure Laterality Date  . Appendectomy      Remote  . Coronary angioplasty  04/12/14    STENT TO RCA  . Knee arthroscopy Bilateral   . Carpal tunnel release Bilateral   . Left heart catheterization with coronary angiogram N/A 04/12/2014    Procedure: LEFT HEART CATHETERIZATION WITH CORONARY ANGIOGRAM;  Surgeon: Blane Ohara, MD;  Location: [facility] CATH LAB;  Service: Cardiovascular;  Laterality: N/A;  . Percutaneous coronary stent intervention (pci-s)   04/12/2014    Procedure: PERCUTANEOUS CORONARY STENT INTERVENTION (PCI-S);  Surgeon: Blane Ohara, MD;  Location: [facility] CATH LAB;  Service: Cardiovascular;;  . Percutaneous coronary stent intervention (pci-s) N/A 04/14/2014    Procedure: PERCUTANEOUS CORONARY STENT INTERVENTION (PCI-S);  Surgeon: Peter M Martinique, MD;  Location: [facility] CATH LAB;  Service: Cardiovascular;  Laterality: N/A;    Current Outpatient Prescriptions  Medication Sig Dispense Refill  . acetaminophen (TYLENOL) 100 MG/ML solution Take 10 mg/kg by mouth 3 (three) times daily.    Marland Kitchen aspirin EC 81 MG tablet Take 81 mg by mouth daily.    Marland Kitchen docusate sodium (COLACE) 250 MG capsule Take 250 mg by mouth daily.    Marland Kitchen HYDROcodone-acetaminophen (NORCO/VICODIN) 5-325 MG per tablet Take 1 tablet by mouth 2 (two) times daily as needed for moderate pain.     . meloxicam (MOBIC) 15 MG tablet Take 15 mg by mouth daily.  1  . metoprolol succinate (TOPROL-XL) 25 MG 24 hr tablet take 1 tablet by mouth once daily 90 tablet 2  . nitroGLYCERIN (NITROSTAT) 0.4 MG SL tablet Place 0.4 mg under the tongue every 5 (five) minutes as needed for chest pain.    . Omega-3 1000 MG CAPS Take 1,000 mg by mouth 2 (two) times daily.    Marland Kitchen omeprazole (PRILOSEC) 40 MG capsule Take 40 mg by mouth daily.  0  . RA KRILL OIL 500  MG CAPS Take 500 mg by mouth daily.    . traMADol (ULTRAM) 50 MG tablet Take 100 mg by mouth 3 (three) times daily.    Marland Kitchen lovastatin (MEVACOR) 20 MG tablet Take 1 tablet (20 mg total) by mouth at bedtime. 30 tablet 11   No current facility-administered medications for this visit.    Allergies:   Review of patient's allergies indicates no known allergies.   Social History:  The patient  reports that he quit smoking about 33 years ago. He has never used smokeless tobacco. He reports that he does not drink alcohol or use illicit drugs.   Family History:  The patient's  family history includes Coronary artery disease (age of onset: 17) in his mother.     ROS:  Please see the history of present illness.  Otherwise, review of systems is positive for Back pain, muscle pain, easy bruising, leg pain.  All other systems are reviewed and negative.   PHYSICAL EXAM: VS:  BP 136/80 mmHg  Ht 6' (1.829 m)  Wt 260 lb (117.935 kg)  BMI 35.25 kg/m2 , BMI Body mass index is 35.25 kg/(m^2). GEN: Well nourished, well developed, pleasant obese male in no acute distress HEENT: normal Neck: no JVD, no masses. No carotid bruits Cardiac: RRR without murmur or gallop                Respiratory:  clear to auscultation bilaterally, normal work of breathing GI: soft, nontender, nondistended, + BS MS: no deformity or atrophy Ext: trace bilateral pretibial edema, pedal pulses 2+= bilaterally Skin: warm and dry, no rash Neuro:  Strength and sensation are intact Psych: euthymic mood, full affect  EKG:  EKG is ordered today. The ekg ordered today shows normal sinus rhythm 88 bpm, minimal voltage criteria for LVH maybe normal variant, age-indeterminate inferior infarct.  Recent Labs: 05/03/2016: ALT 19; BUN 19; Creat 1.05; Potassium 4.0; Sodium 139   Lipid Panel     Component Value Date/Time   CHOL 183 05/03/2016 0859   TRIG 147 05/03/2016 0859   HDL 39* 05/03/2016 0859   CHOLHDL 4.7 05/03/2016 0859   VLDL 29 05/03/2016 0859   LDLCALC 115 05/03/2016 0859      Wt Readings from Last 3 Encounters:  05/07/16 260 lb (117.935 kg)  05/16/15 266 lb (120.657 kg)  06/22/14 243 lb (110.224 kg)     Cardiac Studies Reviewed: 2D Echo 04-13-2014: Study Conclusions  - Left ventricle: The cavity size was normal. Wall thickness was normal. Systolic function was vigorous. The estimated ejection fraction was in the range of 65% to 70%. Wall motion was normal; there were no regional wall motion abnormalities. Doppler parameters are consistent with abnormal left ventricular relaxation (grade 1 diastolic dysfunction). - Left atrium: The atrium was moderately  dilated.  Cath 04-12-2014: PROCEDURAL FINDINGS Hemodynamics: AO 111/68   Coronary angiography: Coronary dominance: right  Left mainstem: Widely patent without obstructive disease. Divides into the LAD and left circumflex the  Left anterior descending (LAD): The LAD is moderately calcified. There is diffuse 30-40% proximal LAD stenosis. The mid vessel is patent with nonobstructive stenosis of approximately 40% just after the second diagonal branch. The first diagonal has 75% stenosis in its distal portion. The second diagonal is small and has diffuse 70% stenosis. The distal LAD has mild diffuse disease and wraps the LV apex.  Left circumflex (LCx): The proximal circumflex is patent. This is a large caliber vessel. The first obtuse marginal branch has mild diffuse disease  in its proximal aspect followed by a napkin ring 90-95% stenosis in its midportion. The AV circumflex in the mid and distal portions are patent and the left posterolateral branch is patent.  Right coronary artery (RCA): This is a large, dominant vessel. The proximal vessel has 95% eccentric stenosis. The distal vessel has 95% hypodense stenosis. There is some ectasia in the intervening portions of the vessel. The PDA and PLA branches are patent with TIMI-3 flow.  Left ventriculography: Deferred  PCI Note: Following the diagnostic procedure, the decision was made to proceed with PCI. It was difficult to get a supportive guide catheter in position. Ultimately a hockey-stick guide was utilized. A cougar coronary guidewire was used to cross the lesion. The lesion was predilated with a 2.5 mm balloon. The proximal and distal vessel were both heavily diseased. Both lesions were stented with a 3.5 x 28 mm Xience drug-eluting stent distally and a 3.5x38 mm Xience Alpine DES proximally. The stents were deployed in overlapping fashion. The stents were postdilated with a 4.0 mm noncompliant balloon. Following PCI, there  was 0% residual stenosis and TIMI-3 flow at both lesion sites. Final angiography confirmed an excellent result. The patient tolerated the procedure well. There were no immediate procedural complications. A TR band was used for radial hemostasis. The patient was transferred to the post catheterization recovery area for further monitoring.  PCI Data: Lesion 1 Vessel - RCA/Segment - proximal Percent Stenosis (pre) 95 TIMI-flow 3 Stent 3.5x38 mm Xience Alpine DES Percent Stenosis (post) 0 TIMI-flow (post) 3  Lesion 2 Vessel - RCA/Segment - distal Percent Stenosis (pre) 95 TIMI-flow 3 Stent 3.5x28 mm Xience DES Percent Stenosis (post) 0 TIMI-flow (post) 3  Final Conclusions:  1. 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 Patent LAD with diffuse nonobstructive disease    Recommendations:   Check Echo for assessment of LV function  Staged PCI of OM branch Wednesday pending assessment of patient's clinical stability and LV function  WOULD NOT USE RIGHT RADIAL ACCESS FOR FUTURE CATH OR PCI PROCEDURES   Cath 04-14-2014: Procedure: PTCA and stenting of the first OM  Indication: 75 yo WM with recent NSTEMI and complex stenting of the RCA presents for staged PCI of the first OM which has a 90-95% mid vessel stenosis.  Procedural Details: The left wrist was prepped, draped, and anesthetized with 1% lidocaine. Using the modified Seldinger technique, a 6 Fr sheath was introduced into the left radial artery. 3 mg verapamil was administered through the radial sheath. Weight-based Haparin was given for anticoagulation. Once a therapeutic ACT was achieved, a 6 [place] left Voda 3.5 guide catheter was inserted. A prowater coronary guidewire was used to cross the lesion. The lesion was predilated with a 2.5 mm balloon. The lesion was then stented with a 2.75 x 12 mm Resolulte stent. The stent was  postdilated with a 2.75 mm noncompliant balloon. Following PCI, there was 0% residual stenosis and TIMI-3 flow. Final angiography confirmed an excellent result. The patient tolerated the procedure well. There were no immediate procedural complications. A TR band was used for radial hemostasis. The patient was transferred to the post catheterization recovery area for further monitoring.  Lesion Data: Vessel: OM 1 Percent stenosis (pre): 90-95% TIMI-flow (pre): 3 Stent: 2.75 x 12 mm Resolute DES Percent stenosis (post): 0% TIMI-flow (post): 3  Conclusions: Successful stenting of the first OM with a DES.  Recommendations: Dual antiplatelet therapy for at least one year. Anticipate DC in am.  ASSESSMENT AND PLAN: 1.  CAD, native vessel: No symptoms of angina. Medical program reviewed, includes aspirin, beta blocker, and a statin drug, and will be continued. He stopped Brilinta one year ago and has not had any recurrent ischemic events.  2. Hyperlipidemia: Recent lipids reviewed. He was unable to tolerate high-dose atorvastatin. He's been changed to lovastatin 10 mg. Will increase this to 20 mg as his LDL is above goal. He is followed by his primary physician.  3. Obesity: Suspect this is contributing to his exertional dyspnea. Lifestyle modification reviewed. He is going to work on diet and exercise.  Current medicines are reviewed with the patient today.  The patient does not have concerns regarding medicines.  Labs/ tests ordered today include:   Orders Placed This Encounter  Procedures  . EKG 12-Lead   Disposition:   FU one year  Signed, Sherren Mocha, MD  05/07/2016 1:31 PM    [facility] [place], [place], [place]  [address] Phone: [phone]; Fax: [phone]

## 2016-05-07 NOTE — Patient Instructions (Signed)
Medication Instructions:  Your physician has recommended you make the following change in your medication:  1. INCREASE Mevacor 20mg  take one tablet by mouth daily  Labwork: No new orders.   Testing/Procedures: No new orders.   Follow-Up: Your physician wants you to follow-up in: 1 YEAR with Dr Burt Knack.  You will receive a reminder letter in the mail two months in advance. If you don't receive a letter, please call our office to schedule the follow-up appointment.   Any Other Special Instructions Will Be Listed Below (If Applicable).     If you need a refill on your cardiac medications before your next appointment, please call your pharmacy.

## 2016-05-11 ENCOUNTER — Other Ambulatory Visit: Payer: Medicare HMO

## 2016-05-17 ENCOUNTER — Ambulatory Visit: Payer: Medicare HMO | Admitting: Cardiovascular Disease

## 2016-07-28 ENCOUNTER — Other Ambulatory Visit: Payer: Self-pay | Admitting: Cardiovascular Disease

## 2016-07-31 ENCOUNTER — Other Ambulatory Visit: Payer: Self-pay | Admitting: *Deleted

## 2016-07-31 MED ORDER — NITROGLYCERIN 0.4 MG SL SUBL: 0.4000 mg | SUBLINGUAL_TABLET | SUBLINGUAL | 1 refills | Status: DC | PRN

## 2016-09-20 ENCOUNTER — Other Ambulatory Visit: Payer: Self-pay | Admitting: Cardiovascular Disease

## 2016-11-22 ENCOUNTER — Telehealth: Payer: Self-pay | Admitting: Cardiovascular Disease

## 2016-11-22 NOTE — Telephone Encounter (Signed)
New message   Pt wife verbalized that she is calling for appt for Dr.Cooper

## 2016-11-22 NOTE — Telephone Encounter (Signed)
Pt states when he first gets up for the day and starts exerting he notices slight CP.  Resolves quickly with rest.  This occurs 2-3 times after first getting up for the day and then pt has no problems.  Occasionally uses Nitro for this.  No vitals available.  Only other symptom is very rarely pt will have some numbness in both arms that resolves along with CP.  Scheduled pt to see Cecilie Kicks, NP on 11/27/16.  Advised pt if symptoms worsen or new symptoms develop to please report to ER for eval.  Pt verbalized understanding and was appreciative for assistance.

## 2016-11-27 ENCOUNTER — Ambulatory Visit: Payer: Medicare HMO | Admitting: Cardiology

## 2016-11-29 ENCOUNTER — Ambulatory Visit (INDEPENDENT_AMBULATORY_CARE_PROVIDER_SITE_OTHER): Payer: Medicare HMO | Admitting: Cardiology

## 2016-11-29 ENCOUNTER — Encounter: Payer: Self-pay | Admitting: Cardiology

## 2016-11-29 VITALS — BP 168/86 | HR 83 | Ht 72.0 in | Wt 257.8 lb

## 2016-11-29 DIAGNOSIS — R079 Chest pain, unspecified: Secondary | ICD-10-CM

## 2016-11-29 DIAGNOSIS — I10 Essential (primary) hypertension: Secondary | ICD-10-CM

## 2016-11-29 DIAGNOSIS — I2510 Atherosclerotic heart disease of native coronary artery without angina pectoris: Secondary | ICD-10-CM | POA: Diagnosis not present

## 2016-11-29 DIAGNOSIS — E6609 Other obesity due to excess calories: Secondary | ICD-10-CM

## 2016-11-29 DIAGNOSIS — E782 Mixed hyperlipidemia: Secondary | ICD-10-CM | POA: Diagnosis not present

## 2016-11-29 NOTE — Progress Notes (Signed)
Cardiology Office Note   Date:  11/29/2016   ID:  Allen James, DOB 1941/10/24, MRN [id number]  PCP:  Nilda Simmer, MD  Cardiologist:  Dr. Burt Knack     Chief Complaint  Patient presents with  . Chest Pain    with exertion   . Shortness of Breath    over doing things but after sitting down it goes away       History of Present Illness: Allen James is a 76 y.o. male who presents for chest pain.   He has a hx of coronary artery disease. He presented in May 2015 with non-ST elevation infarction. He was found to have critical stenoses in the right coronary artery treated with drug-eluting stents. He underwent staged PCI in the first OM branch the left circumflex. Medical therapy was recommended for her residual CAD. LVEF was normal by echocardiography. (04/14/14 Successful stenting of the first OM with a DES and 04/12/14 with successful PCI of the RCA (complex diffuse disease)  WOULD NOT USE RIGHT RADIAL ACCESS FOR FUTURE CATH OR PCI PROCEDURES  Today he tells me he has been having lt sided dull pressure when walking outside in the AM.  If he rests it goes away immediately.  He has had recent cold and thought it could be the cold air.  But also with walking up flights of steps in his home he also has to stop due to same discomfort and again it resolves quickly.  He has tried NTG but he believes they were old.  And he really has not needed.  We also discussed wearing scarf over nose and mouth to warm the air before it hits his lungs.     Recent cold treated with alka seltzer with improvement.    No longer using CPAP, has not needed.  Dr. Annamaria Boots told him if he did ok off of it he could stop.    Past Medical History:  Diagnosis Date  . Abnormal findings on cardiac catheterization    ! Do NOT use RIGHT radial access on future caths or PCI.  Marland Kitchen Arthritis   . Back pain   . CAD (coronary artery disease)    a. NSTEMI 04/12/14 s/p DES x 2 to RCA. Prox 3.5x38 mm Xience Alpine DES, Dist 3.5x28  mm Xience DES; EF 65-70%. Staged PCI to the OM1 with 2.75 x 12 mm Resolute DES     . GERD (gastroesophageal reflux disease)   . H/O hiatal hernia   . Hyperlipidemia   . OSA on CPAP     Past Surgical History:  Procedure Laterality Date  . APPENDECTOMY     Remote  . CARPAL TUNNEL RELEASE Bilateral   . CORONARY ANGIOPLASTY  04/12/14   STENT TO RCA  . KNEE ARTHROSCOPY Bilateral   . LEFT HEART CATHETERIZATION WITH CORONARY ANGIOGRAM N/A 04/12/2014   Procedure: LEFT HEART CATHETERIZATION WITH CORONARY ANGIOGRAM;  Surgeon: Blane Ohara, MD;  Location: [facility] CATH LAB;  Service: Cardiovascular;  Laterality: N/A;  . PERCUTANEOUS CORONARY STENT INTERVENTION (PCI-S)  04/12/2014   Procedure: PERCUTANEOUS CORONARY STENT INTERVENTION (PCI-S);  Surgeon: Blane Ohara, MD;  Location: [facility] CATH LAB;  Service: Cardiovascular;;  . PERCUTANEOUS CORONARY STENT INTERVENTION (PCI-S) N/A 04/14/2014   Procedure: PERCUTANEOUS CORONARY STENT INTERVENTION (PCI-S);  Surgeon: Peter M Martinique, MD;  Location: [facility] CATH LAB;  Service: Cardiovascular;  Laterality: N/A;     Current Outpatient Prescriptions  Medication Sig Dispense Refill  . acetaminophen (TYLENOL) 100 MG/ML solution Take 10  mg/kg by mouth 3 (three) times daily.    Marland Kitchen aspirin EC 81 MG tablet Take 81 mg by mouth daily.    Marland Kitchen docusate sodium (COLACE) 250 MG capsule Take 250 mg by mouth daily.    Marland Kitchen HYDROcodone-acetaminophen (NORCO/VICODIN) 5-325 MG per tablet Take 1 tablet by mouth 2 (two) times daily as needed for moderate pain.     Marland Kitchen lovastatin (MEVACOR) 20 MG tablet Take 1 tablet (20 mg total) by mouth at bedtime. 30 tablet 11  . meloxicam (MOBIC) 15 MG tablet Take 15 mg by mouth daily.  1  . metoprolol succinate (TOPROL-XL) 25 MG 24 hr tablet take 1 tablet by mouth once daily 90 tablet 2  . nitroGLYCERIN (NITROSTAT) 0.4 MG SL tablet Place 1 tablet (0.4 mg total) under the tongue every 5 (five) minutes as needed for chest pain (Max 3 doses within 15 minutes).  25 tablet 1  . Omega-3 1000 MG CAPS Take 1,000 mg by mouth 2 (two) times daily.    Marland Kitchen omeprazole (PRILOSEC) 40 MG capsule Take 40 mg by mouth daily.  0  . RA KRILL OIL 500 MG CAPS Take 500 mg by mouth daily.    . traMADol (ULTRAM) 50 MG tablet Take 100 mg by mouth 3 (three) times daily.     No current facility-administered medications for this visit.     Allergies:   Patient has no known allergies.    Social History:  The patient  reports that he quit smoking about 34 years ago. He has never used smokeless tobacco. He reports that he does not drink alcohol or use drugs.   Family History:  The patient's family history includes Coronary artery disease (age of onset: 41) in his mother.    ROS:  General:no colds or fevers, + weight loss Skin:no rashes or ulcers HEENT:no blurred vision, no congestion CV:see HPI PUL:see HPI GI:no diarrhea constipation or melena, no indigestion GU:no hematuria, no dysuria MS:no joint pain, no claudication Neuro:no syncope, no lightheadedness Endo:no diabetes, no thyroid disease  Wt Readings from Last 3 Encounters:  11/29/16 257 lb 12.8 oz (116.9 kg)  05/07/16 260 lb (117.9 kg)  05/16/15 266 lb (120.7 kg)     PHYSICAL EXAM: VS:  BP (!) 168/86   Pulse 83   Ht 6' (1.829 m)   Wt 257 lb 12.8 oz (116.9 kg)   BMI 34.96 kg/m  , BMI Body mass index is 34.96 kg/m. General:Pleasant affect, NAD Skin:Warm and dry, brisk capillary refill HEENT:normocephalic, sclera clear, mucus membranes moist Neck:supple, no JVD, no bruits  Heart:S1S2 RRR without murmur, gallup, rub or click Lungs:clear without rales, rhonchi, or wheezes [id number], non tender, + BS, do not palpate liver spleen or masses Ext:no lower ext edema, 2+ pedal pulses, 2+ radial pulses Neuro:alert and oriented, MAE, follows commands, + facial symmetry  BP on recheck  150/92  EKG:  EKG is ordered today. The ekg ordered today demonstrates SR with 1st degree AV block otherwise no acute changes.      Recent Labs: 05/03/2016: ALT 19; BUN 19; Creat 1.05; Potassium 4.0; Sodium 139    Lipid Panel    Component Value Date/Time   CHOL 183 05/03/2016 0859   TRIG 147 05/03/2016 0859   HDL 39 (L) 05/03/2016 0859   CHOLHDL 4.7 05/03/2016 0859   VLDL 29 05/03/2016 0859   LDLCALC 115 05/03/2016 0859       Other studies Reviewed: Additional studies/ records that were reviewed today include:   04/12/2014 Cath  PCI Data: Lesion 1 Vessel - RCA/Segment - proximal Percent Stenosis (pre)  95 TIMI-flow 3 Stent 3.5x38 mm Xience Alpine DES Percent Stenosis (post) 0 TIMI-flow (post) 3  Lesion 2 Vessel - RCA/Segment - distal Percent Stenosis (pre)  95 TIMI-flow 3 Stent 3.5x28 mm Xience DES Percent Stenosis (post) 0 TIMI-flow (post) 3  Final Conclusions:   1. 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 Patent LAD with diffuse nonobstructive disease    04/14/2014 PCI Lesion Data: Vessel: OM 1 Percent stenosis (pre): 90-95% TIMI-flow (pre):  3 Stent:  2.75 x 12 mm Resolute DES Percent stenosis (post): 0% TIMI-flow (post): 3  Conclusions: Successful stenting of the first OM with a DES.  Recommendations: Dual antiplatelet therapy for at least one year. Anticipate DC in am.   ECHO 03/2014 Study Conclusions  - Left ventricle: The cavity size was normal. Wall thickness was normal. Systolic function was vigorous. The estimated ejection fraction was in the range of 65% to 70%. Wall motion was normal; there were no regional wall motion abnormalities. Doppler parameters are consistent with abnormal left ventricular relaxation (grade 1 diastolic dysfunction). - Left atrium: The atrium was moderately dilated.  ASSESSMENT AND PLAN:  1.  Chest pain mostly when outside in cold but has happened with exertion in the house.  Plan lexiscan myoivew then follow up with me or Dr.  Burt Knack.  Pt aware if abnormal he may need cardiac cath.  2.  CAD with hx of stents  3.  HTN elevated today may be related to cold symptoms, his wife will check at home and if stays elevated will need to call so we can adjust meds.  His wife is a retired [employer] scientist.   4.  HLD, continue statin.  5. Obesity, wt is coming down, if neg nuc he may be able to increase activity.     Current medicines are reviewed with the patient today.  The patient Has no concerns regarding medicines.  The following changes have been made:  See above Labs/ tests ordered today include:see above  Disposition:   FU:  see above  Signed, Cecilie Kicks, NP  11/29/2016 3:27 PM    [facility] [place], [place], [place], [place] Phone: [phone]; Fax: [phone]

## 2016-11-29 NOTE — Patient Instructions (Signed)
Medication Instructions:    Your physician recommends that you continue on your current medications as directed. Please refer to the Current Medication list given to you today.  --- If you need a refill on your cardiac medications before your next appointment, please call your pharmacy. ---  Labwork:  None ordered  Testing/Procedures: Your physician has requested that you have a lexiscan myoview. For further information please visit [email or web address]. Please follow instruction sheet, as given.  Follow-Up:  Your physician recommends that you schedule a follow-up appointment in: with Cecilie Kicks, NP or Dr. Burt Knack after stress testing.  Thank you for choosing [facility]!!     Any Other Special Instructions Will Be Listed Below (If Applicable).  Pharmacologic Stress Electrocardiogram A pharmacologic stress electrocardiogram is a heart (cardiac) test that uses nuclear imaging to evaluate the blood supply to your heart. This test may also be called a pharmacologic stress electrocardiography. Pharmacologic means that a medicine is used to increase your heart rate and blood pressure.  This stress test is done to find areas of poor blood flow to the heart by determining the extent of coronary artery disease (CAD). Some people exercise on a treadmill, which naturally increases the blood flow to the heart. For those people unable to exercise on a treadmill, a medicine is used. This medicine stimulates your heart and will cause your heart to beat harder and more quickly, as if you were exercising.  Pharmacologic stress tests can help determine:  The adequacy of blood flow to your heart during increased levels of activity in order to clear you for discharge home.  The extent of coronary artery blockage caused by CAD.  Your prognosis if you have suffered a heart attack.  The effectiveness of cardiac procedures done, such as an angioplasty, which can increase the circulation in your  coronary arteries.  Causes of chest pain or pressure. LET [facility] CARE PROVIDER KNOW ABOUT:  Any allergies you have.  All medicines you are taking, including vitamins, herbs, eye drops, creams, and over-the-counter medicines.  Previous problems you or members of your family have had with the use of anesthetics.  Any blood disorders you have.  Previous surgeries you have had.  Medical conditions you have.  Possibility of pregnancy, if this applies.  If you are currently breastfeeding. RISKS AND COMPLICATIONS Generally, this is a safe procedure. However, as with any procedure, complications can occur. Possible complications include:  You develop pain or pressure in the following areas:  Chest.  Jaw or neck.  Between your shoulder blades.  Radiating down your left arm.  Headache.  Dizziness or light-headedness.  Shortness of breath.  Increased or irregular heartbeat.  Low blood pressure.  Nausea or vomiting.  Flushing.  Redness going up the arm and slight pain during injection of medicine.  Heart attack (rare). BEFORE THE PROCEDURE   Avoid all forms of caffeine for 24 hours before your test or as directed by your health care provider. This includes coffee, tea (even decaffeinated tea), caffeinated sodas, chocolate, cocoa, and certain pain medicines.  Follow your health care provider's instructions regarding eating and drinking before the test.  Take your medicines as directed at regular times with water unless instructed otherwise. Exceptions may include:  If you have diabetes, ask how you are to take your insulin or pills. It is common to adjust insulin dosing the morning of the test.  If you are taking beta-blocker medicines, it is important to talk to your health care provider about these  medicines well before the date of your test. Taking beta-blocker medicines may interfere with the test. In some cases, these medicines need to be changed or stopped 24  hours or more before the test.  If you wear a nitroglycerin patch, it may need to be removed prior to the test. Ask your health care provider if the patch should be removed before the test.  If you use an inhaler for any breathing condition, bring it with you to the test.  If you are an outpatient, bring a snack so you can eat right after the stress phase of the test.  Do not smoke for 4 hours prior to the test or as directed by your health care provider.  Do not apply lotions, powders, creams, or oils on your chest prior to the test.  Wear comfortable shoes and clothing. Let your health care provider know if you were unable to complete or follow the preparations for your test. PROCEDURE   Multiple patches (electrodes) will be put on your chest. If needed, small areas of your chest may be shaved to get better contact with the electrodes. Once the electrodes are attached to your body, multiple wires will be attached to the electrodes, and your heart rate will be monitored.  An IV access will be started. A nuclear trace (isotope) is given. The isotope may be given intravenously, or it may be swallowed. Nuclear refers to several types of radioactive isotopes, and the nuclear isotope lights up the arteries so that the nuclear images are clear. The isotope is absorbed by your body. This results in low radiation exposure.  A resting nuclear image is taken to show how your heart functions at rest.  A medicine is given through the IV access.  A second scan is done about 1 hour after the medicine injection and determines how your heart functions under stress.  During this stress phase, you will be connected to an electrocardiogram machine. Your blood pressure and oxygen levels will be monitored. AFTER THE PROCEDURE   Your heart rate and blood pressure will be monitored after the test.  You may return to your normal schedule, including diet,activities, and medicines, unless your health care  provider tells you otherwise. This information is not intended to replace advice given to you by your health care provider. Make sure you discuss any questions you have with your health care provider. Document Released: 03/31/2009 Document Revised: 11/17/2013 Document Reviewed: 07/20/2013 Elsevier Interactive Patient Education  2017 [employer].        r

## 2016-11-30 ENCOUNTER — Telehealth (HOSPITAL_COMMUNITY): Payer: Self-pay | Admitting: *Deleted

## 2016-11-30 NOTE — Telephone Encounter (Signed)
Patient given detailed instructions per Myocardial Perfusion Study Information Sheet for the test on 12/03/16 at 7:45. Patient notified to arrive 15 minutes early and that it is imperative to arrive on time for appointment to keep from having the test rescheduled.  If you need to cancel or reschedule your appointment, please call the office within 24 hours of your appointment. Failure to do so may result in a cancellation of your appointment, and a $50 no show fee. Patient verbalized understanding.Veronia Beets

## 2016-12-03 ENCOUNTER — Ambulatory Visit (HOSPITAL_COMMUNITY): Payer: Medicare HMO | Attending: Cardiology

## 2016-12-03 DIAGNOSIS — R0609 Other forms of dyspnea: Secondary | ICD-10-CM | POA: Diagnosis not present

## 2016-12-03 DIAGNOSIS — I2510 Atherosclerotic heart disease of native coronary artery without angina pectoris: Secondary | ICD-10-CM | POA: Diagnosis not present

## 2016-12-03 DIAGNOSIS — R079 Chest pain, unspecified: Secondary | ICD-10-CM | POA: Insufficient documentation

## 2016-12-03 LAB — MYOCARDIAL PERFUSION IMAGING
LV dias vol: 116 mL (ref 62–150)
LV sys vol: 52 mL
Peak HR: 106 {beats}/min
RATE: 0.33
Rest HR: 80 {beats}/min
SDS: 0
SRS: 2
SSS: 2
TID: 1.12

## 2016-12-03 MED ORDER — TECHNETIUM TC 99M TETROFOSMIN IV KIT
10.2000 | PACK | Freq: Once | INTRAVENOUS | Status: AC | PRN
  Administered 2016-12-03: 10.2 via INTRAVENOUS
  Filled 2016-12-03: qty 11

## 2016-12-03 MED ORDER — TECHNETIUM TC 99M TETROFOSMIN IV KIT
32.1000 | PACK | Freq: Once | INTRAVENOUS | Status: AC | PRN
  Administered 2016-12-03: 32.1 via INTRAVENOUS
  Filled 2016-12-03: qty 33

## 2016-12-03 MED ORDER — REGADENOSON 0.4 MG/5ML IV SOLN
0.4000 mg | Freq: Once | INTRAVENOUS | Status: AC
  Administered 2016-12-03: 0.4 mg via INTRAVENOUS

## 2016-12-10 NOTE — Progress Notes (Signed)
Cardiology Office Note   Date:  12/11/2016   ID:  Allen James, DOB March 08, 1941, MRN [id number]  PCP:  Nilda Simmer, MD  Cardiologist:  Dr. Burt Knack    Chief Complaint  Patient presents with  . Chest Pain    pt states when exercising   . Shortness of Breath    when exercising       History of Present Illness: Allen James is a 76 y.o. male who presents for post nuc study follow up.   He has a hx of coronary artery disease. He presented in May 2015 with non-ST elevation infarction. He was found to have critical stenoses in the right coronary artery treated with drug-eluting stents. He underwent staged PCI in the first OM branch the left circumflex. Medical therapy was recommended for her residual CAD. LVEF was normal by echocardiography. (04/14/14 Successful stenting of the first OM with a DES and 04/12/14 with successful PCI of the RCA (complex diffuse disease)  WOULD NOT USE RIGHT RADIAL ACCESS FOR FUTURE CATH OR PCI PROCEDURES  He was seen 11/29/16 after bronchitis and was having chest pressure with being in the cold.  Some with walking up stairs in his house we did nuc study EF 55% and test with normal perfusion and low risk study.   Today he does continue with the chest pressure, only if he pushes himself some-ie he has to slow down walking to mailbox or he develops chest pressure.  It resolves quickly.  His BP is elevated today we discussed medications and I have added Imdur.      Past Medical History:  Diagnosis Date  . Abnormal findings on cardiac catheterization    ! Do NOT use RIGHT radial access on future caths or PCI.  Marland Kitchen Arthritis   . Back pain   . CAD (coronary artery disease)    a. NSTEMI 04/12/14 s/p DES x 2 to RCA. Prox 3.5x38 mm Xience Alpine DES, Dist 3.5x28 mm Xience DES; EF 65-70%. Staged PCI to the OM1 with 2.75 x 12 mm Resolute DES     . GERD (gastroesophageal reflux disease)   . H/O hiatal hernia   . Hyperlipidemia   . OSA on CPAP     Past Surgical  History:  Procedure Laterality Date  . APPENDECTOMY     Remote  . CARPAL TUNNEL RELEASE Bilateral   . CORONARY ANGIOPLASTY  04/12/14   STENT TO RCA  . KNEE ARTHROSCOPY Bilateral   . LEFT HEART CATHETERIZATION WITH CORONARY ANGIOGRAM N/A 04/12/2014   Procedure: LEFT HEART CATHETERIZATION WITH CORONARY ANGIOGRAM;  Surgeon: Blane Ohara, MD;  Location: [facility] CATH LAB;  Service: Cardiovascular;  Laterality: N/A;  . PERCUTANEOUS CORONARY STENT INTERVENTION (PCI-S)  04/12/2014   Procedure: PERCUTANEOUS CORONARY STENT INTERVENTION (PCI-S);  Surgeon: Blane Ohara, MD;  Location: [facility] CATH LAB;  Service: Cardiovascular;;  . PERCUTANEOUS CORONARY STENT INTERVENTION (PCI-S) N/A 04/14/2014   Procedure: PERCUTANEOUS CORONARY STENT INTERVENTION (PCI-S);  Surgeon: Peter M Martinique, MD;  Location: [facility] CATH LAB;  Service: Cardiovascular;  Laterality: N/A;     Current Outpatient Prescriptions  Medication Sig Dispense Refill  . acetaminophen (TYLENOL) 100 MG/ML solution Take 10 mg/kg by mouth 3 (three) times daily.    Marland Kitchen aspirin EC 81 MG tablet Take 81 mg by mouth daily.    Marland Kitchen docusate sodium (COLACE) 250 MG capsule Take 250 mg by mouth daily.    Marland Kitchen HYDROcodone-acetaminophen (NORCO/VICODIN) 5-325 MG per tablet Take 1 tablet by mouth 2 (two)  times daily as needed for moderate pain.     Marland Kitchen lovastatin (MEVACOR) 20 MG tablet Take 1 tablet (20 mg total) by mouth at bedtime. 30 tablet 11  . meloxicam (MOBIC) 15 MG tablet Take 15 mg by mouth daily.  1  . metoprolol succinate (TOPROL-XL) 25 MG 24 hr tablet take 1 tablet by mouth once daily 90 tablet 2  . nitroGLYCERIN (NITROSTAT) 0.4 MG SL tablet Place 1 tablet (0.4 mg total) under the tongue every 5 (five) minutes as needed for chest pain (Max 3 doses within 15 minutes). 25 tablet 1  . Omega-3 1000 MG CAPS Take 1,000 mg by mouth 2 (two) times daily.    Marland Kitchen omeprazole (PRILOSEC) 40 MG capsule Take 40 mg by mouth daily.  0  . RA KRILL OIL 500 MG CAPS Take 500 mg by mouth  daily.    . traMADol (ULTRAM) 50 MG tablet Take 100 mg by mouth 3 (three) times daily.    . isosorbide mononitrate (IMDUR) 30 MG 24 hr tablet Take 1 tablet (30 mg total) by mouth daily. 90 tablet 3   No current facility-administered medications for this visit.     Allergies:   Patient has no known allergies.    Social History:  The patient  reports that he quit smoking about 34 years ago. He has never used smokeless tobacco. He reports that he does not drink alcohol or use drugs.   Family History:  The patient's family history includes Coronary artery disease (age of onset: 45) in his mother.    ROS:  General:no colds or fevers, no weight changes CV:see HPI PUL:see HPI GI:no diarrhea constipation or melena, no indigestion GU:no hematuria, no dysuria Neuro:no syncope, no lightheadedness   Wt Readings from Last 3 Encounters:  12/11/16 261 lb 12.8 oz (118.8 kg)  12/03/16 257 lb (116.6 kg)  11/29/16 257 lb 12.8 oz (116.9 kg)     PHYSICAL EXAM: VS:  BP (!) 152/80   Pulse 89   Ht 6' (1.829 m)   Wt 261 lb 12.8 oz (118.8 kg)   BMI 35.51 kg/m  , BMI Body mass index is 35.51 kg/m. General:Pleasant affect, NAD Skin:Warm and dry, brisk capillary refill HEENT:normocephalic, sclera clear, mucus membranes moist Neck:supple, no JVD, no bruits  Heart:S1S2 RRR without murmur, gallup, rub or click Lungs:clear without rales, rhonchi, or wheezes [id number], non tender, + BS, do not palpate liver spleen or masses Ext:no lower ext edema, 2+ pedal pulses, 2+ radial pulses Neuro:alert and oriented, MAE, follows commands, + facial symmetry    EKG:  EKG is NOT ordered today.    Recent Labs: 05/03/2016: ALT 19; BUN 19; Creat 1.05; Potassium 4.0; Sodium 139    Lipid Panel    Component Value Date/Time   CHOL 183 05/03/2016 0859   TRIG 147 05/03/2016 0859   HDL 39 (L) 05/03/2016 0859   CHOLHDL 4.7 05/03/2016 0859   VLDL 29 05/03/2016 0859   LDLCALC 115 05/03/2016 0859       Other  studies Reviewed: Additional studies/ records that were reviewed today include: . Nuc study  Study Highlights    Nuclear stress EF: 55%.  Blood pressure demonstrated a normal response to exercise.  There was no ST segment deviation noted during stress.  The study is normal.  This is a low risk study.  Normal perfusion      ASSESSMENT AND PLAN:  1.  Angina, stable, neg nuc study will review with Dr. Burt Knack - I have added  Imdur 30 mg daily, pt was instructed to call if no improvement.  He has follow up with Dr. Burt Knack but on call back list.    2. CAD with hx of stents  3.  HTN elevated today may be related to cold symptoms, his wife will check at home and if stays elevated will need to call so we can adjust meds.  His wife is a retired [employer] scientist.   4.  HLD, continue statin.  5. Obesity, wt is coming down, if neg nuc he may be able to increase activity.    Current medicines are reviewed with the patient today.  The patient Has no concerns regarding medicines.  The following changes have been made:  See above Labs/ tests ordered today include:see above  Disposition:   FU:  see above  Signed, Cecilie Kicks, NP  12/11/2016 11:38 AM    [facility], [place], [place], [place] Phone: [phone]; Fax: [phone]

## 2016-12-11 ENCOUNTER — Encounter: Payer: Self-pay | Admitting: Cardiology

## 2016-12-11 ENCOUNTER — Ambulatory Visit (INDEPENDENT_AMBULATORY_CARE_PROVIDER_SITE_OTHER): Payer: Medicare HMO | Admitting: Cardiology

## 2016-12-11 VITALS — BP 152/80 | HR 89 | Ht 72.0 in | Wt 261.8 lb

## 2016-12-11 DIAGNOSIS — I10 Essential (primary) hypertension: Secondary | ICD-10-CM | POA: Diagnosis not present

## 2016-12-11 DIAGNOSIS — I2510 Atherosclerotic heart disease of native coronary artery without angina pectoris: Secondary | ICD-10-CM

## 2016-12-11 DIAGNOSIS — E782 Mixed hyperlipidemia: Secondary | ICD-10-CM

## 2016-12-11 MED ORDER — ISOSORBIDE MONONITRATE ER 30 MG PO TB24: 30.0000 mg | ORAL_TABLET | Freq: Every day | ORAL | 3 refills | Status: DC

## 2016-12-11 NOTE — Patient Instructions (Addendum)
Your physician recommends that you continue on your current medications as directed. Please refer to the Current Medication list given to you today. START  ISOSORBIDE 30 MG  EVERY  DAY    Your physician wants you to follow-up in:  AS PLANNED  You will receive a reminder letter in the mail two months in advance. If you don't receive a letter, please call our office to schedule the follow-up appointment.

## 2016-12-20 DIAGNOSIS — I10 Essential (primary) hypertension: Secondary | ICD-10-CM

## 2016-12-20 HISTORY — DX: Essential (primary) hypertension: I10

## 2016-12-30 NOTE — Progress Notes (Signed)
Cardiology Office Note    Date:  12/31/2016   ID:  Allen James, DOB [birth year], MRN [id number]  PCP:  Nilda Simmer, MD  Cardiologist:  Dr. Burt Knack  CC: follow up   History of Present Illness:  Allen James is a 76 y.o. male with a history of CAD, HTN, HLD, and obesity who presents to clinic for follow up.   He presented in 03/2014 with NSTEMI. He was found to have critical stenoses in the right coronary artery treated with drug-eluting stents. He underwent staged PCI in the first OM branch the left circumflex. Medical therapy was recommended for her residual CAD. LVEF was normal by echocardiography. Note was made to not use right radial approach in future.   He was seen in early January for chest pressure and subesquent nuclear stress test on 12/03/16 was low risk. He was seen back in clinic on 12/11/16 and started on imdur. His BP was noted to be elevated.   Today he presents to clinic for follow up. He has been feeling much better since starting the imdur. He is able to walk around a lot better without chest pain. He was able to get up and take his dog out last night without any issues. No CP. He does have some SOB with exertion that he thinks is related to his recent 8 lbs weight gain ( he has been eating more and exercising less due to cold weather).  No LE edema, orthopnea or PND. No dizziness or syncope. No blood in stool or urine. No palpitations.    Past Medical History:  Diagnosis Date  . Abnormal findings on cardiac catheterization    ! Do NOT use RIGHT radial access on future caths or PCI.  Marland Kitchen Arthritis   . Back pain   . CAD (coronary artery disease)    a. NSTEMI 04/12/14 s/p DES x 2 to RCA. Prox 3.5x38 mm Xience Alpine DES, Dist 3.5x28 mm Xience DES; EF 65-70%. Staged PCI to the OM1 with 2.75 x 12 mm Resolute DES     . GERD (gastroesophageal reflux disease)   . H/O hiatal hernia   . Hyperlipidemia   . OSA on CPAP     Past Surgical History:  Procedure Laterality Date  .  APPENDECTOMY     Remote  . CARPAL TUNNEL RELEASE Bilateral   . CORONARY ANGIOPLASTY  04/12/14   STENT TO RCA  . KNEE ARTHROSCOPY Bilateral   . LEFT HEART CATHETERIZATION WITH CORONARY ANGIOGRAM N/A 04/12/2014   Procedure: LEFT HEART CATHETERIZATION WITH CORONARY ANGIOGRAM;  Surgeon: Blane Ohara, MD;  Location: [facility] CATH LAB;  Service: Cardiovascular;  Laterality: N/A;  . PERCUTANEOUS CORONARY STENT INTERVENTION (PCI-S)  04/12/2014   Procedure: PERCUTANEOUS CORONARY STENT INTERVENTION (PCI-S);  Surgeon: Blane Ohara, MD;  Location: [facility] CATH LAB;  Service: Cardiovascular;;  . PERCUTANEOUS CORONARY STENT INTERVENTION (PCI-S) N/A 04/14/2014   Procedure: PERCUTANEOUS CORONARY STENT INTERVENTION (PCI-S);  Surgeon: Peter M Martinique, MD;  Location: [facility] CATH LAB;  Service: Cardiovascular;  Laterality: N/A;    Current Medications: Outpatient Medications Prior to Visit  Medication Sig Dispense Refill  . acetaminophen (TYLENOL) 100 MG/ML solution Take 10 mg/kg by mouth 3 (three) times daily.    Marland Kitchen aspirin EC 81 MG tablet Take 81 mg by mouth daily.    Marland Kitchen docusate sodium (COLACE) 250 MG capsule Take 250 mg by mouth daily.    Marland Kitchen HYDROcodone-acetaminophen (NORCO/VICODIN) 5-325 MG per tablet Take 1 tablet by mouth 2 (two) times  daily as needed for moderate pain.     Marland Kitchen lovastatin (MEVACOR) 20 MG tablet Take 1 tablet (20 mg total) by mouth at bedtime. 30 tablet 11  . meloxicam (MOBIC) 15 MG tablet Take 15 mg by mouth daily.  1  . metoprolol succinate (TOPROL-XL) 25 MG 24 hr tablet take 1 tablet by mouth once daily 90 tablet 2  . nitroGLYCERIN (NITROSTAT) 0.4 MG SL tablet Place 1 tablet (0.4 mg total) under the tongue every 5 (five) minutes as needed for chest pain (Max 3 doses within 15 minutes). 25 tablet 1  . Omega-3 1000 MG CAPS Take 1,000 mg by mouth 2 (two) times daily.    Marland Kitchen omeprazole (PRILOSEC) 40 MG capsule Take 40 mg by mouth daily.  0  . RA KRILL OIL 500 MG CAPS Take 500 mg by mouth daily.    .  traMADol (ULTRAM) 50 MG tablet Take 100 mg by mouth every 12 (twelve) hours as needed.     . isosorbide mononitrate (IMDUR) 30 MG 24 hr tablet Take 1 tablet (30 mg total) by mouth daily. 90 tablet 3   No facility-administered medications prior to visit.      Allergies:   Patient has no known allergies.   Social History   Social History  . Marital status: Married    Spouse name: N/A  . Number of children: N/A  . Years of education: N/A   Social History Main Topics  . Smoking status: Former Smoker    Quit date: 11/26/1982  . Smokeless tobacco: Never Used  . Alcohol use No  . Drug use: No  . Sexual activity: Not on file   Other Topics Concern  . Not on file   Social History Narrative  . No narrative on file     Family History:  The patient's family history includes Coronary artery disease (age of onset: 79) in his mother.      ROS:   Please see the history of present illness.    ROS All other systems reviewed and are negative.   PHYSICAL EXAM:   VS:  BP (!) 150/80   Pulse 86   Ht 6' (1.829 m)   Wt 263 lb (119.3 kg)   BMI 35.67 kg/m    GEN: Well nourished, well developed, in no acute distress, obese HEENT: normal  Neck: no JVD, carotid bruits, or masses Cardiac: RRR; no murmurs, rubs, or gallops,no edema  Respiratory:  clear to auscultation bilaterally, normal work of breathing GI: soft, nontender, nondistended, + BS MS: no deformity or atrophy  Skin: warm and dry, no rash Neuro:  Alert and Oriented x 3, Strength and sensation are intact Psych: euthymic mood, full affect    Wt Readings from Last 3 Encounters:  12/31/16 263 lb (119.3 kg)  12/11/16 261 lb 12.8 oz (118.8 kg)  12/03/16 257 lb (116.6 kg)      Studies/Labs Reviewed:   EKG:  EKG is NOT ordered today.    Recent Labs: 05/03/2016: ALT 19; BUN 19; Creat 1.05; Potassium 4.0; Sodium 139   Lipid Panel    Component Value Date/Time   CHOL 183 05/03/2016 0859   TRIG 147 05/03/2016 0859   HDL 39  (L) 05/03/2016 0859   CHOLHDL 4.7 05/03/2016 0859   VLDL 29 05/03/2016 0859   LDLCALC 115 05/03/2016 0859    Additional studies/ records that were reviewed today include:  Myoview 12/03/16 Study Highlights   Nuclear stress EF: 55%.  Blood pressure demonstrated a normal  response to exercise.  There was no ST segment deviation noted during stress.  The study is normal.  This is a low risk study.  Normal perfusion        ASSESSMENT & PLAN:   Chest pain: recent nuclear stress test low risk. Chest pain much better on imdur 30mg  daily. Still with some DOE. Will increase this to 60mg  daily since BP mildly elevated today  CAD: continue ASA, statin and BB. Increase imdur as above  HTN: BP 150/80 today. Increasing nitrate as above. If his BP remains consistently >140/90 at home, will further increase it to 90mg  daily.   HLD: continue statin   Obesity: Body mass index is 35.67 kg/m. He wants to start exercising more when weather gets warmer.    Medication Adjustments/Labs and Tests Ordered: Current medicines are reviewed at length with the patient today.  Concerns regarding medicines are outlined above.  Medication changes, Labs and Tests ordered today are listed in the Patient Instructions below. Patient Instructions  Medication Instructions:  Your physician has recommended you make the following change in your medication:  1.  INCREASE the Imdur to 60 mg taking 1 tablet daily  Labwork: None ordered  Testing/Procedures: None ordered  Follow-Up: Your physician recommends that you schedule a follow-up appointment in: [place]   Any Other Special Instructions Will Be Listed Below (If Applicable).     If you need a refill on your cardiac medications before your next appointment, please call your pharmacy.      Signed, Angelena Form, PA-C  12/31/2016 9:38 AM    [facility] [address] [place], [place], [place]  [address] Phone:  [phone]; Fax: [phone]

## 2016-12-31 ENCOUNTER — Ambulatory Visit (INDEPENDENT_AMBULATORY_CARE_PROVIDER_SITE_OTHER): Payer: Medicare HMO | Admitting: Physician Assistant

## 2016-12-31 VITALS — BP 150/80 | HR 86 | Ht 72.0 in | Wt 263.0 lb

## 2016-12-31 DIAGNOSIS — I10 Essential (primary) hypertension: Secondary | ICD-10-CM

## 2016-12-31 DIAGNOSIS — R079 Chest pain, unspecified: Secondary | ICD-10-CM | POA: Diagnosis not present

## 2016-12-31 DIAGNOSIS — I25119 Atherosclerotic heart disease of native coronary artery with unspecified angina pectoris: Secondary | ICD-10-CM

## 2016-12-31 DIAGNOSIS — E785 Hyperlipidemia, unspecified: Secondary | ICD-10-CM | POA: Diagnosis not present

## 2016-12-31 MED ORDER — ISOSORBIDE MONONITRATE ER 60 MG PO TB24: 60.0000 mg | ORAL_TABLET | Freq: Every day | ORAL | 3 refills | Status: DC

## 2016-12-31 NOTE — Patient Instructions (Addendum)
Medication Instructions:  Your physician has recommended you make the following change in your medication:  1.  INCREASE the Imdur to 60 mg taking 1 tablet daily  Labwork: None ordered  Testing/Procedures: None ordered  Follow-Up: Your physician recommends that you schedule a follow-up appointment in: [place]   Any Other Special Instructions Will Be Listed Below (If Applicable).     If you need a refill on your cardiac medications before your next appointment, please call your pharmacy.

## 2017-01-30 ENCOUNTER — Telehealth: Payer: Self-pay | Admitting: Physician Assistant

## 2017-01-30 NOTE — Telephone Encounter (Signed)
Pt called because when he was seen last time he was prescribed Isosorbide 60 mg. This dose helps, but when he does more strenuous activity like cutting the grass. He has some discomfort in his chest. Pt  Was told that when this happed this medication dose could be increased. Pt is calling to see if the dose can be increased.

## 2017-01-30 NOTE — Telephone Encounter (Signed)
Pt c/o of Chest Pain: STAT if CP now or developed within 24 hours  1. Are you having CP right now? no 2. Are you experiencing any other symptoms (ex. SOB, nausea, vomiting, sweating)? sob 3. How long have you been experiencing CP? Two weeks  4. Is your CP continuous or coming and going? Coming and going   5. Have you taken Nitroglycerin? yes ?

## 2017-01-31 NOTE — Telephone Encounter (Signed)
Dose can be increased to 90 mg, but I'm concerned he's been having angina over the past 3 months that sounds typical of coronary disease. Would schedule FU appt to discuss cath versus ongoing medical therapy.

## 2017-02-01 NOTE — Telephone Encounter (Signed)
Left message on machine for pt to contact the office.   

## 2017-02-05 MED ORDER — ISOSORBIDE MONONITRATE ER 60 MG PO TB24: ORAL_TABLET | ORAL | 3 refills | Status: DC

## 2017-02-05 NOTE — Telephone Encounter (Signed)
I spoke with the pt and made him aware of recommendation. The pt states that on average his BP runs around 140/80 and he does not experience daily angina.  The pt said last week it was cool outside and he emptied 10 bags of pine bark around his yard when angina started. The pt went inside checked BP 202/102 and he sat down and rested.  Within 5 minutes angina resolved and BP was down to 186/79.  The pt does not feel like an appointment is needed at this time and he prefers to try and increase Imdur to 90mg  daily. New Rx sent to the pharmacy. The pt was advised to contact our office if he has any reoccurrence of angina and pt agreed with plan.  The pt has a pending appointment on 04/08/17 with Dr Burt Knack.

## 2017-02-14 ENCOUNTER — Other Ambulatory Visit: Payer: Self-pay | Admitting: Cardiovascular Disease

## 2017-03-26 ENCOUNTER — Telehealth: Payer: Self-pay | Admitting: Cardiovascular Disease

## 2017-03-26 NOTE — Telephone Encounter (Signed)
Spoke with pharmacist and they claimed to not have the rx that was sent in March 2018 for 90 mg qd. I was placed on hold and they were actually able to find the rx.

## 2017-03-26 NOTE — Telephone Encounter (Signed)
New Message  Roselyn Reef called from [place] [facility] to get the correct dosage for pt Imdur. Please call back to discuss

## 2017-04-08 ENCOUNTER — Ambulatory Visit (INDEPENDENT_AMBULATORY_CARE_PROVIDER_SITE_OTHER): Payer: Medicare HMO | Admitting: Cardiovascular Disease

## 2017-04-08 ENCOUNTER — Encounter: Payer: Self-pay | Admitting: Cardiovascular Disease

## 2017-04-08 VITALS — BP 142/68 | HR 76 | Ht 71.0 in | Wt 268.8 lb

## 2017-04-08 DIAGNOSIS — I25119 Atherosclerotic heart disease of native coronary artery with unspecified angina pectoris: Secondary | ICD-10-CM

## 2017-04-08 DIAGNOSIS — I10 Essential (primary) hypertension: Secondary | ICD-10-CM

## 2017-04-08 DIAGNOSIS — E785 Hyperlipidemia, unspecified: Secondary | ICD-10-CM

## 2017-04-08 MED ORDER — METOPROLOL SUCCINATE ER 50 MG PO TB24: 50.0000 mg | ORAL_TABLET | Freq: Every day | ORAL | 6 refills | Status: DC

## 2017-04-08 MED ORDER — AMLODIPINE BESYLATE 5 MG PO TABS: 5.0000 mg | ORAL_TABLET | Freq: Every day | ORAL | 6 refills | Status: DC

## 2017-04-08 NOTE — Progress Notes (Signed)
Cardiology Office Note Date:  04/08/2017   ID:  AQUAN KOPE, DOB 07-23-1941, MRN [id number]  PCP:  Delilah Shan, MD  Cardiologist:  Sherren Mocha, MD    Chief Complaint  Patient presents with  . Follow-up     History of Present Illness: Allen James is a 76 y.o. male who presents for follow-up evaluation.   The patient is followed for coronary artery disease. He presented in May 2015 with non-ST elevation infarction. He was found to have critical stenoses in the right coronary artery treated with drug-eluting stents. He underwent staged PCI in the first OM branch the left circumflex. Medical therapy was recommended for her residual CAD. LVEF was normal by echocardiography.  The patient is here alone today. He has developed exertional chest discomfort over the last several months. He's been seen in the office and these notes are reviewed. He underwent a stress Myoview scan in January 2018 demonstrating no ischemia. He experiences chest discomfort when he walks down to his work shed to do some work. He has taken sublingual nitroglycerin a few times every week. Symptoms resolve within 3-4 minutes of rest. Symptoms resolve immediately with nitroglycerin. He's had no resting pain and denies any pain symptoms with low level activities. He denies shortness of breath, orthopnea, PND, or heart palpitations. When he was seen 1 year ago based on my documentation he had no chest pain.  Past Medical History:  Diagnosis Date  . Abnormal findings on cardiac catheterization    ! Do NOT use RIGHT radial access on future caths or PCI.  Marland Kitchen Arthritis   . Back pain   . CAD (coronary artery disease)    a. NSTEMI 04/12/14 s/p DES x 2 to RCA. Prox 3.5x38 mm Xience Alpine DES, Dist 3.5x28 mm Xience DES; EF 65-70%. Staged PCI to the OM1 with 2.75 x 12 mm Resolute DES     . GERD (gastroesophageal reflux disease)   . H/O hiatal hernia   . Hyperlipidemia   . OSA on CPAP     Past Surgical History:    Procedure Laterality Date  . APPENDECTOMY     Remote  . CARPAL TUNNEL RELEASE Bilateral   . CORONARY ANGIOPLASTY  04/12/14   STENT TO RCA  . KNEE ARTHROSCOPY Bilateral   . LEFT HEART CATHETERIZATION WITH CORONARY ANGIOGRAM N/A 04/12/2014   Procedure: LEFT HEART CATHETERIZATION WITH CORONARY ANGIOGRAM;  Surgeon: Blane Ohara, MD;  Location: [facility] CATH LAB;  Service: Cardiovascular;  Laterality: N/A;  . PERCUTANEOUS CORONARY STENT INTERVENTION (PCI-S)  04/12/2014   Procedure: PERCUTANEOUS CORONARY STENT INTERVENTION (PCI-S);  Surgeon: Blane Ohara, MD;  Location: [facility] CATH LAB;  Service: Cardiovascular;;  . PERCUTANEOUS CORONARY STENT INTERVENTION (PCI-S) N/A 04/14/2014   Procedure: PERCUTANEOUS CORONARY STENT INTERVENTION (PCI-S);  Surgeon: Peter M Martinique, MD;  Location: [facility] CATH LAB;  Service: Cardiovascular;  Laterality: N/A;    Current Outpatient Prescriptions  Medication Sig Dispense Refill  . aspirin EC 81 MG tablet Take 81 mg by mouth daily.    Marland Kitchen docusate sodium (COLACE) 250 MG capsule Take 250 mg by mouth daily.    Marland Kitchen HYDROcodone-acetaminophen (NORCO/VICODIN) 5-325 MG per tablet Take 1 tablet by mouth 2 (two) times daily as needed for moderate pain.     . isosorbide mononitrate (IMDUR) 30 MG 24 hr tablet Take 30 mg by mouth daily. Take with Isosorbide 60mg  once daily to equal 90mg  once daily    . isosorbide mononitrate (IMDUR) 60 MG 24 hr  tablet Take one and one-half tablet by mouth daily 135 tablet 3  . lovastatin (MEVACOR) 20 MG tablet Take 1 tablet (20 mg total) by mouth at bedtime. 30 tablet 11  . meloxicam (MOBIC) 15 MG tablet Take 15 mg by mouth daily.  1  . metoprolol succinate (TOPROL-XL) 25 MG 24 hr tablet take 1 tablet by mouth once daily 90 tablet 2  . nitroGLYCERIN (NITROSTAT) 0.4 MG SL tablet Place 1 tablet (0.4 mg total) under the tongue every 5 (five) minutes as needed for chest pain. 25 tablet 5  . Omega-3 1000 MG CAPS Take 1,000 mg by mouth 2 (two) times daily.    Marland Kitchen  omeprazole (PRILOSEC) 40 MG capsule Take 40 mg by mouth daily.  0  . RA KRILL OIL 500 MG CAPS Take 500 mg by mouth daily.    . traMADol (ULTRAM) 50 MG tablet Take 100 mg by mouth every 12 (twelve) hours as needed (pain).      No current facility-administered medications for this visit.     Allergies:   Patient has no known allergies.   Social History:  The patient  reports that he quit smoking about 34 years ago. He has never used smokeless tobacco. He reports that he does not drink alcohol or use drugs.   Family History:  The patient's  family history includes Coronary artery disease (age of onset: 2) in his mother.  ROS:  Please see the history of present illness.   All other systems are reviewed and negative.   PHYSICAL EXAM: VS:  BP (!) 142/68   Pulse 76   Ht 5\' 11"  (1.803 m)   Wt 268 lb 12.8 oz (121.9 kg)   BMI 37.49 kg/m  , BMI Body mass index is 37.49 kg/m. GEN: Well nourished, well developed, in no acute distress  HEENT: normal  Neck: no JVD, no masses. No carotid bruits Cardiac: RRR without murmur or gallop                Respiratory:  clear to auscultation bilaterally, normal work of breathing GI: soft, nontender, nondistended, + BS MS: no deformity or atrophy  Ext: no pretibial edema, pedal pulses 2+= bilaterally Skin: warm and dry, no rash Neuro:  Strength and sensation are intact Psych: euthymic mood, full affect  EKG:  EKG is not ordered today.  Recent Labs: 05/03/2016: ALT 19; BUN 19; Creat 1.05; Potassium 4.0; Sodium 139   Lipid Panel     Component Value Date/Time   CHOL 183 05/03/2016 0859   TRIG 147 05/03/2016 0859   HDL 39 (L) 05/03/2016 0859   CHOLHDL 4.7 05/03/2016 0859   VLDL 29 05/03/2016 0859   LDLCALC 115 05/03/2016 0859      Wt Readings from Last 3 Encounters:  04/08/17 268 lb 12.8 oz (121.9 kg)  12/31/16 263 lb (119.3 kg)  12/11/16 261 lb 12.8 oz (118.8 kg)     Cardiac Studies Reviewed: Myoview 12/03/2016: Study Highlights     Nuclear stress EF: 55%.  Blood pressure demonstrated a normal response to exercise.  There was no ST segment deviation noted during stress.  The study is normal.  This is a low risk study.  Normal perfusion   ASSESSMENT AND PLAN: 1.  CAD, native vessel, with angina: The patient has new symptoms of CCS class 2-3 angina. He is taking nitroglycerin about twice per week. Anginal symptoms are occurring with moderate level activity. Interesting that his nuclear scan in January was normal. I went  back and reviewed his cardiac catheterization studies from 2015. His anginal symptoms are new over the last year. We discussed the pros and cons of medical therapy versus a repeat cath and possible PCI. Will initially trial titration of his antianginal drugs and arrange close follow-up visit in 4-6 weeks. Will increase metoprolol succinate to 50 mg daily and add amlodipine 5 mg daily. He will continue on isosorbide 90 mg daily. He remains on aspirin 81 mg. He understands that if symptoms worsen he should contact us immediately. He understands that of resting pain occurs he needs to call 911.  2. Essential hypertension: Blood pressure is not under optimal control. He brings in some home readings and they are reviewed today. Blood pressure is ranging from 130 to 160 mmHg. Medicine changes as detailed above.  3. Hyperlipidemia: Continue lovastatin 20 mg. He's been unable to tolerate high intensity statin drugs. May need to consider a PCSK9 inhibitor in the future. Lifestyle modification reviewed with the patient.   Current medicines are reviewed with the patient today.  The patient does not have concerns regarding medicines.  Labs/ tests ordered today include:  No orders of the defined types were placed in this encounter.  Disposition:   FU 4-6 weeks with APP  Signed, Sherren Mocha, MD  04/08/2017 9:39 AM    [facility] [place], [place], [address]  [address] Phone: [phone]; Fax: [phone]

## 2017-04-08 NOTE — Patient Instructions (Addendum)
Medication Instructions:  Your physician has recommended you make the following change in your medication:  1. INCREASE Metoprolol Succinate to 50mg  take one tablet by mouth daily 2. START Amlodipine 5mg  take one tablet by mouth daily  Labwork: No new orders.   Testing/Procedures: No new orders.   Follow-Up: Your physician recommends that you schedule a follow-up appointment in: 4-6 WEEKS with Cecilie Kicks NP or Nell Range PA   Any Other Special Instructions Will Be Listed Below (If Applicable).     If you need a refill on your cardiac medications before your next appointment, please call your pharmacy.

## 2017-05-21 NOTE — Progress Notes (Signed)
Cardiology Office Note   Date:  05/22/2017   ID:  Allen James, DOB [birth year], MRN [id number]  PCP:  Allen Shan, MD  Cardiologist:  Allen James   Chief Complaint  Patient presents with  . Chest Pain    now with higher dose of meds.      History of Present Illness: Allen James is a 76 y.o. male who presents for angina with CAD.  Pt has past hx in May 2015 with non-ST elevation infarction. He was found to have critical stenoses in the right coronary artery treated with drug-eluting stents. He underwent staged PCI in the first OM branch the left circumflex. Medical therapy was recommended for her residual CAD. LVEF was normal by echocardiography.  This year he had neg nuc study in Jan. But he is having angina 2-3 times per week walking to his shed.  He will take NTG and pain resolves.  On last visit with Allen James the pt and Allen James discussed medical management vs cath and PCI.  Plan was for medicine unless this does not help.  Metoprolol was increased to 50 mg daily, and amlodipine 5 mg daily was added.  He was already taking 90 mg of IMdur daily.  He is still on asa 81 mg daily.   Additionally BP was running higher than we recommend so medication changes should help.    Today still with 2 episodes per week of angina relieved with SL NTG.   When he becomes rushed.  This sounds similar to his visit with Allen James.   EKG without changes.  His last lipids  LDL 112, HDL 45, TG 131 and T chol 183 through PCP at [facility] has intolerance to high dose statins, may need repatha and he is interested in going to lipid clinic.  BP is still elevated but oerall improved from last visit, now the 287 systolic is his high.   Past Medical History:  Diagnosis Date  . Abnormal findings on cardiac catheterization    ! Do NOT use RIGHT radial access on future caths or PCI.  Marland Kitchen Arthritis   . Back pain   . CAD (coronary artery disease)    a. NSTEMI 04/12/14 s/p DES x 2 to RCA. Prox 3.5x38 mm  Xience Alpine DES, Dist 3.5x28 mm Xience DES; EF 65-70%. Staged PCI to the OM1 with 2.75 x 12 mm Resolute DES     . GERD (gastroesophageal reflux disease)   . H/O hiatal hernia   . Hyperlipidemia   . OSA on CPAP     Past Surgical History:  Procedure Laterality Date  . APPENDECTOMY     Remote  . CARPAL TUNNEL RELEASE Bilateral   . CORONARY ANGIOPLASTY  04/12/14   STENT TO RCA  . KNEE ARTHROSCOPY Bilateral   . LEFT HEART CATHETERIZATION WITH CORONARY ANGIOGRAM N/A 04/12/2014   Procedure: LEFT HEART CATHETERIZATION WITH CORONARY ANGIOGRAM;  Surgeon: Allen Ohara, MD;  Location: [facility] CATH LAB;  Service: Cardiovascular;  Laterality: N/A;  . PERCUTANEOUS CORONARY STENT INTERVENTION (PCI-S)  04/12/2014   Procedure: PERCUTANEOUS CORONARY STENT INTERVENTION (PCI-S);  Surgeon: Allen Ohara, MD;  Location: [facility] CATH LAB;  Service: Cardiovascular;;  . PERCUTANEOUS CORONARY STENT INTERVENTION (PCI-S) N/A 04/14/2014   Procedure: PERCUTANEOUS CORONARY STENT INTERVENTION (PCI-S);  Surgeon: Allen M Martinique, MD;  Location: [facility] CATH LAB;  Service: Cardiovascular;  Laterality: N/A;     Current Outpatient Prescriptions  Medication Sig Dispense Refill  . amLODipine (NORVASC) 5 MG  tablet Take 1 tablet (5 mg total) by mouth daily. 30 tablet 6  . aspirin EC 81 MG tablet Take 81 mg by mouth daily.    Marland Kitchen docusate sodium (COLACE) 250 MG capsule Take 250 mg by mouth daily.    Marland Kitchen HYDROcodone-acetaminophen (NORCO/VICODIN) 5-325 MG per tablet Take 1 tablet by mouth 2 (two) times daily as needed for moderate pain.     Marland Kitchen lovastatin (MEVACOR) 20 MG tablet Take 1 tablet (20 mg total) by mouth at bedtime. 30 tablet 11  . meloxicam (MOBIC) 15 MG tablet Take 15 mg by mouth daily.  1  . metoprolol succinate (TOPROL-XL) 50 MG 24 hr tablet Take 1 tablet (50 mg total) by mouth daily. 30 tablet 6  . nitroGLYCERIN (NITROSTAT) 0.4 MG SL tablet Place 1 tablet (0.4 mg total) under the tongue every 5 (five) minutes as needed for chest  pain. 25 tablet 5  . Omega-3 1000 MG CAPS Take 1,000 mg by mouth 2 (two) times daily.    Marland Kitchen omeprazole (PRILOSEC) 40 MG capsule Take 40 mg by mouth daily.  0  . RA KRILL OIL 500 MG CAPS Take 500 mg by mouth daily.    . traMADol (ULTRAM) 50 MG tablet Take 100 mg by mouth every 12 (twelve) hours as needed (pain).     . isosorbide mononitrate (IMDUR) 120 MG 24 hr tablet Take 1 tablet (120 mg total) by mouth daily. 90 tablet 3   No current facility-administered medications for this visit.     Allergies:   Patient has no known allergies.    Social History:  The patient  reports that he quit smoking about 34 years ago. He has never used smokeless tobacco. He reports that he does not drink alcohol or use drugs.   Family History:  The patient's family history includes Coronary artery disease (age of onset: 75) in his mother.    ROS:  General:no colds or fevers, no weight changes Skin:no rashes or ulcers HEENT:no blurred vision, no congestion CV:see HPI PUL:see HPI GI:no diarrhea constipation or melena, no indigestion GU:no hematuria, no dysuria MS:no joint pain, no claudication Neuro:no syncope, no lightheadedness Endo:no diabetes, no thyroid disease  Wt Readings from Last 3 Encounters:  05/22/17 270 lb 12.8 oz (122.8 kg)  04/08/17 268 lb 12.8 oz (121.9 kg)  12/31/16 263 lb (119.3 kg)     PHYSICAL EXAM: VS:  BP (!) 150/70   Pulse 78   Ht 6' (1.829 m)   Wt 270 lb 12.8 oz (122.8 kg)   BMI 36.73 kg/m  , BMI Body mass index is 36.73 kg/m. General:Pleasant affect, NAD Skin:Warm and dry, brisk capillary refill HEENT:normocephalic, sclera clear, mucus membranes moist Neck:supple, no JVD, no bruits  Heart:S1S2 RRR without murmur, gallup, rub or click Lungs:clear without rales, rhonchi, or wheezes ASN:KNLZ, non tender, + BS, do not palpate liver spleen or masses Ext:no lower ext edema, 2+ pedal pulses, 2+ radial pulses Neuro:alert and oriented, MAE, follows commands, + facial  symmetry    EKG:  EKG is ordered today. The ekg ordered today demonstrates SR with 1st degree AV block.  Pr 216 ms.  No acute changes.    Recent Labs: No results found for requested labs within last 8760 hours.    Lipid Panel    Component Value Date/Time   CHOL 183 05/03/2016 0859   TRIG 147 05/03/2016 0859   HDL 39 (L) 05/03/2016 0859   CHOLHDL 4.7 05/03/2016 0859   VLDL 29 05/03/2016 0859  [place] 115 05/03/2016 0859       Other studies Reviewed: Additional studies/ records that were reviewed today include: . Nuc study 12/03/16 Study Highlights    Nuclear stress EF: 55%.  Blood pressure demonstrated a normal response to exercise.  There was no ST segment deviation noted during stress.  The study is normal.  This is a low risk study.  Normal perfusion     ASSESSMENT AND PLAN:  1. Angina still with NTG responsive episodes about 2 X week.  Will increase imdur to 120 mg daily and see how this does.  Will also notify Allen James ? To proceed with cath.  2. CAD with hx of NSTEMI, and has had DES to RCA and first OM of LCX.    3. HTN- improved increasing Imdur may help  4. HLD on statin but does not tolerate high doses, will send to Lipid clinic for possible PCSK9 inhibitor.  Pt agreeable.     Current medicines are reviewed with the patient today.  The patient Has no concerns regarding medicines.  The following changes have been made:  See above Labs/ tests ordered today include:see above  Disposition:   FU:  see above  Signed, Cecilie Kicks, NP  05/22/2017 9:36 AM    [facility], [place], [place], [place] Phone: [phone]; Fax: [phone]

## 2017-05-22 ENCOUNTER — Ambulatory Visit (INDEPENDENT_AMBULATORY_CARE_PROVIDER_SITE_OTHER): Payer: Medicare HMO | Admitting: Cardiology

## 2017-05-22 ENCOUNTER — Encounter: Payer: Self-pay | Admitting: Cardiology

## 2017-05-22 VITALS — BP 150/70 | HR 78 | Ht 72.0 in | Wt 270.8 lb

## 2017-05-22 DIAGNOSIS — I25119 Atherosclerotic heart disease of native coronary artery with unspecified angina pectoris: Secondary | ICD-10-CM

## 2017-05-22 DIAGNOSIS — I10 Essential (primary) hypertension: Secondary | ICD-10-CM | POA: Diagnosis not present

## 2017-05-22 DIAGNOSIS — E785 Hyperlipidemia, unspecified: Secondary | ICD-10-CM

## 2017-05-22 DIAGNOSIS — E7800 Pure hypercholesterolemia, unspecified: Secondary | ICD-10-CM | POA: Diagnosis not present

## 2017-05-22 MED ORDER — ISOSORBIDE MONONITRATE ER 120 MG PO TB24: 120.0000 mg | ORAL_TABLET | Freq: Every day | ORAL | 3 refills | Status: DC

## 2017-05-22 NOTE — Patient Instructions (Signed)
Medication Instructions:  1. INCREASE IMDUR TO 120 MG IN THE MORNING; NEW RX HAS BEEN SENT IN FOR THE 120 MG TABLET  Labwork: NONE ORDERED  Testing/Procedures: NONE ORDERED  Follow-Up: 1. YOU ARE BEING REFERRED TO THE LIPID CLINIC TO BE DONE IN THE NEXT 1-2 MONTH  2. YOU WILL NEED A FOLLOW UP IN 1 MONTH WITH  LAURA INGOLD, NP SAME DAY DR. Burt Knack IS IN THE OFFICE   Any Other Special Instructions Will Be Listed Below (If Applicable).     If you need a refill on your cardiac medications before your next appointment, please call your pharmacy.

## 2017-05-23 NOTE — Progress Notes (Signed)
With continued anginal symptoms on optimal Rx I think we should move forward with definitive evaluation with cath +/- PCI. Thanks Mickel Baas

## 2017-05-24 ENCOUNTER — Telehealth: Payer: Self-pay | Admitting: *Deleted

## 2017-05-24 ENCOUNTER — Telehealth: Payer: Self-pay | Admitting: Cardiology

## 2017-05-24 NOTE — Telephone Encounter (Signed)
New message   Pt states her husband was called to schedule catherization. She states they wanted to increase his bp medicine to 2 a day, but she forgot. She states that her husband would rather wait and after a month they will go if it is still needed. He wants to make sure that he tries everything before having to do the catherization again.

## 2017-05-24 NOTE — Telephone Encounter (Signed)
Pt has been made aware to keep his f/u appt in August, but if he continues to have more / worsening cp, to call us and we will arrange heart cath.  Pt verbalized understanding.

## 2017-05-24 NOTE — Telephone Encounter (Signed)
See phone note before. Pt already been spoken to.

## 2017-05-24 NOTE — Telephone Encounter (Signed)
-----   Message from Isaiah Serge, NP sent at 05/24/2017  9:14 AM EDT ----- Ok  appt in August.  That is fine.  Keep appt in August unless he has more pain then he should call and we will arrange cath.  Thanks.   ----- Message ----- From: Jeanann Lewandowsky, RMA Sent: 05/24/2017   8:13 AM To: Isaiah Serge, NP  Pt states that since you increased the Imdur, that he hasn't had any pain since he has saw you.  He also said that he thought the recommendation was that they try this for a month and if it wasn't any better, then proceed with the cath. Pt is heading out of town today and will not be back til Monday. He said if you still wanted to have him come in, since he hasn't had any pain, to call him back next week.  Please advise! ----- Message ----- From: Isaiah Serge, NP Sent: 05/24/2017   8:01 AM To: Jeanann Lewandowsky, RMA  Please let pt know that Dr. Burt Knack believes he should go ahead with cardiac cath.  Medication is not working as we expected.  He can come in today if we have spots and discuss or as soon as we can please.  Thanks.  Mickel Baas.   ----- Message ----- From: Isaiah Serge, NP Sent: 05/23/2017  10:37 PM To: Isaiah Serge, NP  Plan for cardiac cath.   ----- Message ----- From: Sherren Mocha, MD Sent: 05/23/2017  10:10 PM To: Barkley Boards, RN, Isaiah Serge, NP    ----- Message ----- From: Isaiah Serge, NP Sent: 05/22/2017   1:41 PM To: Sherren Mocha, MD  Saw Mr. Goodner today, we were trying medical therapy for angina, he is stil having 2 episodes of chest pain per week, NTG responsive.  BP improved but still up some.  I increased his imdur  But continue with meds or cath?

## 2017-06-27 NOTE — Progress Notes (Signed)
Cardiology Office Note   Date:  06/28/2017   ID:  [name], DOB 03/26/1941, MRN [id number]  PCP:  Delilah Shan, MD  Cardiologist:  Dr. Burt Knack    Chief Complaint  Patient presents with  . Chest Pain      History of Present Illness: Allen James is a 76 y.o. male who presents for angina eval with increase of imdur.   Pt has past hx in May 2015 with non-ST elevation infarction. He was found to have critical stenoses in the right coronary artery treated with drug-eluting stents. He underwent staged PCI in the first OM branch the left circumflex. Medical therapy was recommended for her residual CAD. LVEF was normal by echocardiography.  This year he had neg nuc study in Jan. But he is having angina 2-3 times per week walking to his shed.  He will take NTG and pain resolves.  On last visit with Dr. Burt Knack the pt and Dr. Burt Knack discussed medical management vs cath and PCI.  Plan was for medicine unless this does not help.  Metoprolol was increased to 50 mg daily, and amlodipine 5 mg daily was added.  He was already taking 90 mg of IMdur daily.  He is still on asa 81 mg daily.   discussed with Dr. Burt Knack and cath is next step.   Today he said when he arrived home he realized he had not increased his toprol from Dr. Antionette Char visit.  So he increased and increased the imdur.  No further chest pain.  Some DOE but not significant or unusual for him.  He had a great vacation at the beach and feels well.  His PCP follows chol.    Past Medical History:  Diagnosis Date  . Abnormal findings on cardiac catheterization    ! Do NOT use RIGHT radial access on future caths or PCI.  Marland Kitchen Arthritis   . Back pain   . CAD (coronary artery disease)    a. NSTEMI 04/12/14 s/p DES x 2 to RCA. Prox 3.5x38 mm Xience Alpine DES, Dist 3.5x28 mm Xience DES; EF 65-70%. Staged PCI to the OM1 with 2.75 x 12 mm Resolute DES     . GERD (gastroesophageal reflux disease)   . H/O hiatal hernia   .  Hyperlipidemia   . OSA on CPAP     Past Surgical History:  Procedure Laterality Date  . APPENDECTOMY     Remote  . CARPAL TUNNEL RELEASE Bilateral   . CORONARY ANGIOPLASTY  04/12/14   STENT TO RCA  . KNEE ARTHROSCOPY Bilateral   . LEFT HEART CATHETERIZATION WITH CORONARY ANGIOGRAM N/A 04/12/2014   Procedure: LEFT HEART CATHETERIZATION WITH CORONARY ANGIOGRAM;  Surgeon: Blane Ohara, MD;  Location: [facility] CATH LAB;  Service: Cardiovascular;  Laterality: N/A;  . PERCUTANEOUS CORONARY STENT INTERVENTION (PCI-S)  04/12/2014   Procedure: PERCUTANEOUS CORONARY STENT INTERVENTION (PCI-S);  Surgeon: Blane Ohara, MD;  Location: [facility] CATH LAB;  Service: Cardiovascular;;  . PERCUTANEOUS CORONARY STENT INTERVENTION (PCI-S) N/A 04/14/2014   Procedure: PERCUTANEOUS CORONARY STENT INTERVENTION (PCI-S);  Surgeon: Peter M Martinique, MD;  Location: [facility] CATH LAB;  Service: Cardiovascular;  Laterality: N/A;     Current Outpatient Prescriptions  Medication Sig Dispense Refill  . amLODipine (NORVASC) 5 MG tablet Take 1 tablet (5 mg total) by mouth daily. 30 tablet 6  . aspirin EC 81 MG tablet Take 81 mg by mouth daily.    Marland Kitchen docusate sodium (COLACE) 250 MG capsule Take 250  mg by mouth daily.    Marland Kitchen HYDROcodone-acetaminophen (NORCO/VICODIN) 5-325 MG per tablet Take 1 tablet by mouth 2 (two) times daily as needed for moderate pain.     . isosorbide mononitrate (IMDUR) 120 MG 24 hr tablet Take 1 tablet (120 mg total) by mouth daily. 90 tablet 3  . lovastatin (MEVACOR) 20 MG tablet Take 1 tablet (20 mg total) by mouth at bedtime. 30 tablet 11  . meloxicam (MOBIC) 15 MG tablet Take 15 mg by mouth daily.  1  . metoprolol succinate (TOPROL-XL) 50 MG 24 hr tablet Take 1 tablet (50 mg total) by mouth daily. 30 tablet 6  . nitroGLYCERIN (NITROSTAT) 0.4 MG SL tablet Place 1 tablet (0.4 mg total) under the tongue every 5 (five) minutes as needed for chest pain. 25 tablet 5  . Omega-3 1000 MG CAPS Take 1,000 mg by mouth 2  (two) times daily.    Marland Kitchen omeprazole (PRILOSEC) 40 MG capsule Take 40 mg by mouth daily.  0  . RA KRILL OIL 500 MG CAPS Take 500 mg by mouth daily.    . traMADol (ULTRAM) 50 MG tablet Take 100 mg by mouth every 12 (twelve) hours as needed (pain).      No current facility-administered medications for this visit.     Allergies:   Patient has no known allergies.    Social History:  The patient  reports that he quit smoking about 34 years ago. He has never used smokeless tobacco. He reports that he does not drink alcohol or use drugs.   Family History:  The patient's family history includes Coronary artery disease (age of onset: 30) in his mother.    ROS:  General:no colds or fevers, no weight changes CV:see HPI PUL:see HPI GI:no diarrhea constipation or melena, no indigestion Neuro:no syncope, no lightheadedness   Wt Readings from Last 3 Encounters:  06/28/17 273 lb (123.8 kg)  05/22/17 270 lb 12.8 oz (122.8 kg)  04/08/17 268 lb 12.8 oz (121.9 kg)     PHYSICAL EXAM: VS:  BP 130/80   Pulse 85   Ht 6' (1.829 m)   Wt 273 lb (123.8 kg)   SpO2 98%   BMI 37.03 kg/m  , BMI Body mass index is 37.03 kg/m. General:Pleasant affect, NAD Skin:Warm and dry, brisk capillary refill HEENT:normocephalic, sclera clear, mucus membranes moist Heart:S1S2 RRR without murmur, gallup, rub or click Lungs:clear without rales, rhonchi, or wheezes Ext:no lower ext edema, 2+ pedal pulses, 2+ radial pulses Neuro:alert and oriented, MAE, follows commands, + facial symmetry    EKG:  EKG is NOT ordered today.    Recent Labs: No results found for requested labs within last 8760 hours.    Lipid Panel    Component Value Date/Time   CHOL 183 05/03/2016 0859   TRIG 147 05/03/2016 0859   HDL 39 (L) 05/03/2016 0859   CHOLHDL 4.7 05/03/2016 0859   VLDL 29 05/03/2016 0859   LDLCALC 115 05/03/2016 0859       Other studies Reviewed: Additional studies/ records that were reviewed today  include:. Neg nuc 11/2016   ASSESSMENT AND PLAN:  1.  Angina, now treated medically and resolved.  Discussed if recurrent would need cardiac cath.  Will follow up with Dr. Burt Knack in 9 months unless problems beforehand.  He has not needed any further NTG.   2.  CAD native vessel.  Hx of NSTEMI  With DES to RCA and OM of LCX. -recent nuc neg for ischemia.  3.  HTN improved with increased imdur and toprol  4.  HLD  He has been intolerant of high intensity statin drugs.  May need to consider PCSK9 in future.    5.  Obesity  Discussed wt . Loss.     Current medicines are reviewed with the patient today.  The patient Has no concerns regarding medicines.  The following changes have been made:  See above Labs/ tests ordered today include:see above  Disposition:   FU:  see above  Signed, Cecilie Kicks, NP  06/28/2017 8:17 AM    [address], [place], [place], [place] Phone: [phone]; Fax: [phone]

## 2017-06-28 ENCOUNTER — Ambulatory Visit (INDEPENDENT_AMBULATORY_CARE_PROVIDER_SITE_OTHER): Payer: Medicare HMO | Admitting: Cardiology

## 2017-06-28 ENCOUNTER — Encounter: Payer: Self-pay | Admitting: Cardiology

## 2017-06-28 VITALS — BP 130/80 | HR 85 | Ht 72.0 in | Wt 273.0 lb

## 2017-06-28 DIAGNOSIS — I10 Essential (primary) hypertension: Secondary | ICD-10-CM | POA: Diagnosis not present

## 2017-06-28 DIAGNOSIS — E7800 Pure hypercholesterolemia, unspecified: Secondary | ICD-10-CM

## 2017-06-28 DIAGNOSIS — I2510 Atherosclerotic heart disease of native coronary artery without angina pectoris: Secondary | ICD-10-CM | POA: Diagnosis not present

## 2017-06-28 DIAGNOSIS — E6609 Other obesity due to excess calories: Secondary | ICD-10-CM | POA: Diagnosis not present

## 2017-06-28 NOTE — Patient Instructions (Signed)
Medication Instructions:  Your physician recommends that you continue on your current medications as directed. Please refer to the Current Medication list given to you today.   Labwork: -None  Testing/Procedures: -None  Follow-Up: Your physician wants you to follow-up in: 9 months with Dr. Burt Knack.  You will receive a reminder letter in the mail two months in advance. If you don't receive a letter, please call our office to schedule the follow-up appointment.   Any Other Special Instructions Will Be Listed Below (If Applicable).     If you need a refill on your cardiac medications before your next appointment, please call your pharmacy.

## 2017-07-02 ENCOUNTER — Ambulatory Visit: Payer: Medicare HMO

## 2017-12-12 ENCOUNTER — Encounter (INDEPENDENT_AMBULATORY_CARE_PROVIDER_SITE_OTHER): Payer: Self-pay | Admitting: Physical Medicine and Rehabilitation

## 2017-12-12 ENCOUNTER — Ambulatory Visit (INDEPENDENT_AMBULATORY_CARE_PROVIDER_SITE_OTHER): Payer: Medicare HMO | Admitting: Physical Medicine and Rehabilitation

## 2017-12-12 ENCOUNTER — Ambulatory Visit (INDEPENDENT_AMBULATORY_CARE_PROVIDER_SITE_OTHER): Payer: Medicare HMO

## 2017-12-12 ENCOUNTER — Telehealth (INDEPENDENT_AMBULATORY_CARE_PROVIDER_SITE_OTHER): Payer: Self-pay | Admitting: Physical Medicine and Rehabilitation

## 2017-12-12 VITALS — BP 150/78 | HR 72 | Temp 98.3°F

## 2017-12-12 DIAGNOSIS — M5416 Radiculopathy, lumbar region: Secondary | ICD-10-CM

## 2017-12-12 DIAGNOSIS — G8929 Other chronic pain: Secondary | ICD-10-CM

## 2017-12-12 DIAGNOSIS — M47816 Spondylosis without myelopathy or radiculopathy, lumbar region: Secondary | ICD-10-CM

## 2017-12-12 DIAGNOSIS — M5442 Lumbago with sciatica, left side: Secondary | ICD-10-CM

## 2017-12-12 NOTE — Progress Notes (Deleted)
Pt states pain in lower back with numbness in left leg. Pt states symptoms has been going on for about 5 weeks. Pt states walking and standing for a long period of time makes the pain worse, sitting with feet elevated makes it better. Pt states numbness in left leg is there all the time. 1 hydrocodone in AM,

## 2017-12-12 NOTE — Telephone Encounter (Signed)
Auto approved on [employer] for code [phone]. Josem Kaufmann #[id number]. eff 12/24/17 only. IC pt and scheduled him for 12/24/17 @ 2:30 w/driver.

## 2017-12-13 ENCOUNTER — Encounter (INDEPENDENT_AMBULATORY_CARE_PROVIDER_SITE_OTHER): Payer: Self-pay | Admitting: Physical Medicine and Rehabilitation

## 2017-12-13 NOTE — Progress Notes (Signed)
Allen James - 77 y.o. male MRN [id number]  Date of birth: 03/21/1941  Office Visit Note: Visit Date: 12/12/2017 PCP: Delilah Shan, MD Referred by: Delilah Shan, MD  Subjective: Chief Complaint  Patient presents with  . Lower Back - Pain  . Left Thigh - Numbness   HPI: [name] is a 77 year old gentleman who I have seen in the past and last saw in 2014.  I remember him quite well and we had completed a combination of radiofrequency ablation of the lower facet joints as well as left-sided transforaminal injections at L4 and S1 for left hip and leg pain.  He reports that over the last 4-5 years she has been doing quite well with his back and just has had on and off again pain and nothing severe.  He reports recent worsening of left hip and leg pain that is really posterior buttock and hamstring pain.  He reports numbness and paresthesia in a somewhat S1 distribution.  He reports this is been going on for 5-6 weeks without any specific injury or trauma.  He reports worsening with walking and standing for long periods of time.  He says sitting with his feet elevated makes the pain much better.  He reports really constant numbness in the left leg.  He has not had any focal weakness or bowel or bladder difficulties.  He is not had any unintended weight loss.  He still tries to remain active.  Since I have seen him he has had a myocardial infarction with stent placement.  He also has a history of obstructive sleep apnea.  Prior knee replacement.  His not a diabetic but not on any blood thinners.  He used to take anti-inflammatory medications but is off of those at this point.  He is taking hydrocodone and tramadol.  When we talked about his medications we discovered that he has been taking 1 hydrocodone in the morning and says to me that he is allowed to take up to 3/day and rarely takes 2.  He was given tramadol for what he thought was his arthritis.  He has been taking 3 tramadol twice a day  every day for a scheduled basis.  Looking at the order for tramadol it was for 2 tramadol up to 3 times a day as needed.  Discussed this at length with the patient and he says it is really only been taking it because he thought he was supposed to take it every day.  He says initially he was taking it 3 times a day but was forgetting the midday dose.  He said he talked his primary care physician and thought it was okay to do 3 twice a day.    Review of Systems  Constitutional: Negative for chills, fever, malaise/fatigue and weight loss.  HENT: Negative for hearing loss and sinus pain.   Eyes: Negative for blurred vision, double vision and photophobia.  Respiratory: Negative for cough and shortness of breath.   Cardiovascular: Negative for chest pain, palpitations and leg swelling.  Gastrointestinal: Negative for abdominal pain, nausea and vomiting.  Genitourinary: Negative for flank pain.  Musculoskeletal: Positive for back pain. Negative for myalgias.       Left hip and leg pain  Skin: Negative for itching and rash.  Neurological: Positive for tingling. Negative for tremors, focal weakness and weakness.  Endo/Heme/Allergies: Negative.   Psychiatric/Behavioral: Negative for depression.  All other systems reviewed and are negative.  Otherwise per HPI.  Assessment &  Plan: Visit Diagnoses:  1. Lumbar radiculopathy   2. Spondylosis without myelopathy or radiculopathy, lumbar region   3. Chronic bilateral low back pain with left-sided sciatica     Plan: Findings:  Chronic history of low back and left hip and leg pain for many many years.  The patient does have stairstep listhesis of L4 on L5 and L5 on S1 with MRI findings in 2012 showing foraminal stenosis more at the L4 level on the left and lateral recess narrowing due to the listhesis but no real appreciable central canal stenosis or nerve compression.  We completed ablation procedure that helped his back pain quite a bit in 2012 and then  went on to complete injections on the left for more buttock pain.  The combination really helped and we have not seen him in many years.  He has had off and on back pain but over the last 6 weeks he has had significant radicular type complaint.  We did obtain x-rays today since we have not seen him in a while and it really appears there is no change on the x-ray other than just increased spondylosis but nothing drastic.  I do not think at this point given his clinical history that he needs an updated MRI although it is something to think about since last MRI was in 2012 which would have been 6 years ago.  I think the next step is a transforaminal injection either at the L4 or S1 spot.  The last injection was done at L4 and seem to give him quite a bit of relief.  His symptoms are a little bit more consistent with S1 but he does not really have anything past the knee to distinguish itself.  He does get some symptoms into the calf.  In terms of his medications I do think he needs to wean himself down on the tramadol and begin taking that just as needed.  No this note will get his primary care physician who will also talked him about this as well.  I discussed the mechanism of tramadol in terms of pain relief and that is a week opioid norepinephrine reuptake inhibitor.  At this point I have instructed him to begin taking just 2 tramadol twice a day instead of the 3.  I told him every week to reduce the tramadol by 1 tablet.  Over the next 4 weeks or so he should be able to just take the tramadol as needed.  Told him to continue to take the one hydrocodone in the morning if that seems to help him.  By taking the one hydrocodone in the morning he should not experience much in the way of withdrawal symptoms.  Just because of the tramadol having a different effect.  We also discussed the fact that tramadol and increased doses can lower the threshold of having a seizure.  He reports that his wife is also been taking the  same way and he will talk to her.    Meds & Orders: No orders of the defined types were placed in this encounter.   Orders Placed This Encounter  Procedures  . XR Lumbar Spine Complete    Follow-up: Return for Left L4 or S1 transforaminal epidural steroid injection..   Procedures: No procedures performed  No notes on file   Clinical History: MRI LUMBAR SPINE WITHOUT CONTRAST 05/18/2011  Technique:  Multiplanar and multiecho pulse sequences of the lumbar spine were obtained without intravenous contrast.  Comparison: 08/31/2009.  Findings: The  same numbering system is used as on the comparison.  Anterolisthesis of L5 on S1-8 is stable measuring 7-8 mm. Anterolisthesis of L4 on L5 is stable measuring 6 mm.  Normal lumbar vertebral height and alignment otherwise.  Stable mild chronic T12 endplate irregularity. No marrow edema or evidence of acute osseous abnormality.   Visualized lower thoracic spinal cord is normal with conus medularis at L2. Visualized abdominal viscera and paraspinal soft tissues are within normal limits.  T11-T12:  Grossly stable disc bulge.  T12-L1:  Negative.  L1-L2:  Negative.  L2-L3:  Subtle disc desiccation and symmetric disc bulge.  No stenosis.  Mild facet hypertrophy.  L3-L4:  Mild circumferential disc bulge.  Mild to moderate facet hypertrophy.  No significant stenosis.  L4-L5:  Spondylolisthesis with severe chronic facet and ligament flavum hypertrophy. Decreased fluid in the right facet joint. Chronic left eccentric pseudo disc bulge.  Prominent annular tear of the disc is increased (series 5 image 5).  The combined there is mild spinal stenosis - which is mildly progressed, mild bilateral lateral recess stenosis - stable, and chronic moderate to severe left L4 foraminal stenosis - stable.  L5-S1:  Spondylolisthesis with severe chronic facet and ligament flavum hypertrophy. Stable circumferential pseudo disc bulge.   No spinal stenosis.  Mild right lateral recess stenosis is stable. Stable mild right L5 foraminal stenosis.  IMPRESSION: 1.  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 Associated severe facet arthropathy at these levels is not significantly changed. 2.  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 3.  Stable mild L5-S1 right lateral recess stenosis and foraminal stenosis.  He reports that he quit smoking about 35 years ago. he has never used smokeless tobacco. No results for input(s): HGBA1C, LABURIC in the last 8760 hours.  Objective:  VS:  HT:    WT:   BMI:     BP:(!) 150/78  HR:72bpm  TEMP:98.3 F (36.8 C)(Oral)  RESP:99 % Physical Exam  Constitutional: He is oriented to person, place, and time. He appears well-developed and well-nourished. No distress.  HENT:  Head: Normocephalic and atraumatic.  Nose: Nose normal.  Mouth/Throat: Oropharynx is clear and moist.  Eyes: Conjunctivae are normal. Pupils are equal, round, and reactive to light.  Neck: Normal range of motion. Neck supple. No tracheal deviation present.  Cardiovascular: Regular rhythm and intact distal pulses.  Pulmonary/Chest: Effort normal and breath sounds normal.  Abdominal: Soft. He exhibits no distension. There is no rebound and no guarding.  Musculoskeletal: He exhibits no deformity.  Patient is somewhat slow to rise from a seated position.  Ambulates without aid.  He has no pain over the greater trochanters.  He has no pain with hip rotation.  He does have significant pain and stiffness with extension rotation.  He has no trigger points noted in the lumbar spine musculature.  He has good strength in the bilateral lower extremities and knee extension flexion and hip flexion.  He has good strength with dorsiflexion and plantar flexion.  He has no clonus.   Neurological: He is alert and oriented to person, place, and time. He exhibits normal muscle tone. Coordination normal.  Skin: Skin is warm. No rash noted.  Psychiatric: He has a normal mood and affect. His behavior is normal.  Nursing note and vitals reviewed.   Ortho Exam Imaging: Xr Lumbar Spine Complete  Result Date: 12/13/2017 4  view lumbar spine plain film x-rays show normal anatomic alignment the AP view but with stairstep listhesis of L4 on L5 and L5 on S1 both of which are grade 1.  There is degenerative disc height loss at L4-5 and L5-S1 there is by foraminal narrowing at L5-S1 and L4-5.  There is bridging osteophytes laterally lower thoracic region.  Sacroiliac joints are well maintained hips show fairly minimal arthritis for his age.   Past Medical/Family/Surgical/Social History: Medications & Allergies reviewed per EMR Patient Active Problem List   Diagnosis Date Noted  . Benign essential hypertension 12/20/2016  . Dyspepsia 03/07/2016  . Primary insomnia 03/07/2016  . Obstructive apnea 12/28/2014  . Pure hypercholesterolemia 12/28/2014  . Osteoarthrosis 09/30/2014  . Chronic lower back pain 07/13/2014  . Muscle soreness 04/27/2014  . CAD (coronary artery disease) 04/14/2014  . AKI (acute kidney injury) ([place]) 04/14/2014  . Obesity 04/14/2014  . Adiposity 04/14/2014  . Chronic coronary artery disease 04/14/2014  . Injury of kidney 04/14/2014  . OSA on CPAP   . Hyperlipidemia   . NSTEMI (non-ST elevated myocardial infarction) ([place]) 04/12/2014  . Acute non-ST segment elevation myocardial infarction ([place]) 04/12/2014  . Acute non-ST elevation myocardial infarction (NSTEMI) ([place]) 04/12/2014  . HOARSENESS 10/18/2009  . SLEEP APNEA 02/24/2009   Past Medical History:  Diagnosis Date  . Abnormal findings on cardiac catheterization    ! Do NOT use RIGHT radial access on future caths or PCI.  Marland Kitchen Arthritis   . Back pain   . CAD (coronary artery disease)    a. NSTEMI  04/12/14 s/p DES x 2 to RCA. Prox 3.5x38 mm Xience Alpine DES, Dist 3.5x28 mm Xience DES; EF 65-70%. Staged PCI to the OM1 with 2.75 x 12 mm Resolute DES     . GERD (gastroesophageal reflux disease)   . H/O hiatal hernia   . Hyperlipidemia   . OSA on CPAP    Family History  Problem Relation Age of Onset  . Coronary artery disease Mother 31   Past Surgical History:  Procedure Laterality Date  . APPENDECTOMY     Remote  . CARPAL TUNNEL RELEASE Bilateral   . CORONARY ANGIOPLASTY  04/12/14   STENT TO RCA  . KNEE ARTHROSCOPY Bilateral   . LEFT HEART CATHETERIZATION WITH CORONARY ANGIOGRAM N/A 04/12/2014   Procedure: LEFT HEART CATHETERIZATION WITH CORONARY ANGIOGRAM;  Surgeon: Blane Ohara, MD;  Location: [facility] CATH LAB;  Service: Cardiovascular;  Laterality: N/A;  . PERCUTANEOUS CORONARY STENT INTERVENTION (PCI-S)  04/12/2014   Procedure: PERCUTANEOUS CORONARY STENT INTERVENTION (PCI-S);  Surgeon: Blane Ohara, MD;  Location: [facility] CATH LAB;  Service: Cardiovascular;;  . PERCUTANEOUS CORONARY STENT INTERVENTION (PCI-S) N/A 04/14/2014   Procedure: PERCUTANEOUS CORONARY STENT INTERVENTION (PCI-S);  Surgeon: Peter M Martinique, MD;  Location: [facility] CATH LAB;  Service: Cardiovascular;  Laterality: N/A;   Social History   Occupational History  . Not on file  Tobacco Use  . Smoking status: Former Smoker    Last attempt to quit: 11/26/1982    Years since quitting: 35.0  . Smokeless tobacco: Never Used  Substance and Sexual Activity  . Alcohol use: No  . Drug use: No  . Sexual activity: Not on file

## 2017-12-24 ENCOUNTER — Encounter (INDEPENDENT_AMBULATORY_CARE_PROVIDER_SITE_OTHER): Payer: Self-pay | Admitting: Physical Medicine and Rehabilitation

## 2017-12-24 ENCOUNTER — Ambulatory Visit (INDEPENDENT_AMBULATORY_CARE_PROVIDER_SITE_OTHER): Payer: Medicare HMO

## 2017-12-24 ENCOUNTER — Ambulatory Visit (INDEPENDENT_AMBULATORY_CARE_PROVIDER_SITE_OTHER): Payer: Medicare HMO | Admitting: Physical Medicine and Rehabilitation

## 2017-12-24 VITALS — BP 113/68 | HR 69 | Temp 98.4°F

## 2017-12-24 DIAGNOSIS — M5416 Radiculopathy, lumbar region: Secondary | ICD-10-CM

## 2017-12-24 MED ORDER — BETAMETHASONE SOD PHOS & ACET 6 (3-3) MG/ML IJ SUSP
12.0000 mg | Freq: Once | INTRAMUSCULAR | Status: AC
  Administered 2017-12-24: 12 mg

## 2017-12-24 NOTE — Progress Notes (Deleted)
Pt states a constant pain in left hip that radiates down the left leg. Pt states pain has been going on for about 6 weeks. Pt states normal activities makes pain worse, elevating left leg eases pain. Pt states last injection helped out a lot  +Driver, -BT,-Dye Allergies.

## 2017-12-24 NOTE — Patient Instructions (Signed)

## 2017-12-25 NOTE — Procedures (Signed)
Allen James is a 77 year old gentleman that I recently saw for evaluation of his left radicular leg pain.  He comes in today for planned left L L4 transforaminal epidural steroid injection.  Please see our prior evaluation and management note for further details and justification.  Lumbosacral Transforaminal Epidural Steroid Injection - Sub-Pedicular Approach with Fluoroscopic Guidance  Patient: Allen James      Date of Birth: Oct 11, 1941 MRN: [id number] PCP: Delilah Shan, MD      Visit Date: 12/24/2017   Universal Protocol:    Date/Time: 12/24/2017  Consent Given By: the patient  Position: PRONE  Additional Comments: Vital signs were monitored before and after the procedure. Patient was prepped and draped in the usual sterile fashion. The correct patient, procedure, and site was verified.   Injection Procedure Details:  Procedure Site One Meds Administered:  Meds ordered this encounter  Medications  . betamethasone acetate-betamethasone sodium phosphate (CELESTONE) injection 12 mg    Laterality: Left  Location/Site:  L4-L5  Needle size: 22 G  Needle type: Spinal  Needle Placement: Transforaminal  Findings:    -Comments: Excellent flow of contrast along the nerve and into the epidural space.  Procedure Details: After squaring off the end-plates to get a true AP view, the C-arm was positioned so that an oblique view of the foramen as noted above was visualized. The target area is just inferior to the "nose of the scotty dog" or sub pedicular. The soft tissues overlying this structure were infiltrated with 2-3 ml. of 1% Lidocaine without Epinephrine.  The spinal needle was inserted toward the target using a "trajectory" view along the fluoroscope beam.  Under AP and lateral visualization, the needle was advanced so it did not puncture dura and was located close the 6 O'Clock position of the pedical in AP tracterory. Biplanar projections were used to confirm  position. Aspiration was confirmed to be negative for CSF and/or blood. A 1-2 ml. volume of Isovue-250 was injected and flow of contrast was noted at each level. Radiographs were obtained for documentation purposes.   After attaining the desired flow of contrast documented above, a 0.5 to 1.0 ml test dose of 0.25% Marcaine was injected into each respective transforaminal space.  The patient was observed for 90 seconds post injection.  After no sensory deficits were reported, and normal lower extremity motor function was noted,   the above injectate was administered so that equal amounts of the injectate were placed at each foramen (level) into the transforaminal epidural space.   Additional Comments:  The patient tolerated the procedure well Dressing: Band-Aid    Post-procedure details: Patient was observed during the procedure. Post-procedure instructions were reviewed.  Patient left the clinic in stable condition.   Pertinent Imaging: MRI LUMBAR SPINE WITHOUT CONTRAST 05/18/2011  Technique:  Multiplanar and multiecho pulse sequences of the lumbar spine were obtained without intravenous contrast.  Comparison: 08/31/2009.  Findings: The same numbering system is used as on the comparison.  Anterolisthesis of L5 on S1-8 is stable measuring 7-8 mm. Anterolisthesis of L4 on L5 is stable measuring 6 mm.  Normal lumbar vertebral height and alignment otherwise.  Stable mild chronic T12 endplate irregularity. No marrow edema or evidence of acute osseous abnormality.   Visualized lower thoracic spinal cord is normal with conus medularis at L2. Visualized abdominal viscera and paraspinal soft tissues are within normal limits.  T11-T12:  Grossly stable disc bulge.  T12-L1:  Negative.  L1-L2:  Negative.  L2-L3:  Subtle disc  desiccation and symmetric disc bulge.  No stenosis.  Mild facet hypertrophy.  L3-L4:  Mild circumferential disc bulge.  Mild to moderate  facet hypertrophy.  No significant stenosis.  L4-L5:  Spondylolisthesis with severe chronic facet and ligament flavum hypertrophy. Decreased fluid in the right facet joint. Chronic left eccentric pseudo disc bulge.  Prominent annular tear of the disc is increased (series 5 image 5).  The combined there is mild spinal stenosis - which is mildly progressed, mild bilateral lateral recess stenosis - stable, and chronic moderate to severe left L4 foraminal stenosis - stable.  L5-S1:  Spondylolisthesis with severe chronic facet and ligament flavum hypertrophy. Stable circumferential pseudo disc bulge.  No spinal stenosis.  Mild right lateral recess stenosis is stable. Stable mild right L5 foraminal stenosis.  IMPRESSION: 1.  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 Associated severe facet arthropathy at these levels is not significantly changed. 2.  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 3.  Stable mild L5-S1 right lateral recess stenosis and foraminal stenosis.

## 2018-03-26 ENCOUNTER — Ambulatory Visit (INDEPENDENT_AMBULATORY_CARE_PROVIDER_SITE_OTHER): Payer: Medicare HMO | Admitting: Physician Assistant

## 2018-03-26 ENCOUNTER — Encounter (INDEPENDENT_AMBULATORY_CARE_PROVIDER_SITE_OTHER): Payer: Self-pay | Admitting: Physician Assistant

## 2018-03-26 ENCOUNTER — Ambulatory Visit (INDEPENDENT_AMBULATORY_CARE_PROVIDER_SITE_OTHER): Payer: Medicare HMO

## 2018-03-26 DIAGNOSIS — M25521 Pain in right elbow: Secondary | ICD-10-CM

## 2018-03-26 DIAGNOSIS — M7711 Lateral epicondylitis, right elbow: Secondary | ICD-10-CM | POA: Diagnosis not present

## 2018-03-26 MED ORDER — METHYLPREDNISOLONE ACETATE 40 MG/ML IJ SUSP
40.0000 mg | INTRAMUSCULAR | Status: AC | PRN
  Administered 2018-03-26: 40 mg

## 2018-03-26 MED ORDER — LIDOCAINE HCL 1 % IJ SOLN
3.0000 mL | INTRAMUSCULAR | Status: AC | PRN
  Administered 2018-03-26: 3 mL

## 2018-03-26 NOTE — Progress Notes (Signed)
   Procedure Note  Patient: Allen James             Date of Birth: Sep 25, 1941           MRN: [id number]             Visit Date: 03/26/2018  HPI: Mr. Earnest comes in today due to right elbow pain for the last few weeks.  No particular injury.  He does play a lot of golf and does yard work.  Pain radiates down the arm no numbness tingling.  Does take some hydrocodone for pain this does not really help with his elbow pain.  He is also on meloxicam 15 mg.   Radiographs:AP lateral right elbow: Elbows are well located.  The elbow joint is well-maintained.  Small spur off of the olecranon region.  Otherwise no acute fractures or bony abnormalities  Physical exam: Right elbow he has full range of motion the elbow.  Tenderness over the lateral epicondyle region.  General well-developed well-nourished male in no acute distress mood and affect appropriate. Remainder the elbow is nontender.  There is no rashes skin lesions ulcerations or impending ulcers.  He has full supination pronation forearm.  Extension of the wrist against resistance causes pain lateral aspect the elbow.  He has no real discomfort with resisted pronation.  Full sensation throughout the right hand.  Full motor right hand.  Radial pulses 2+.  Procedures: Visit Diagnoses: Pain in right elbow - Plan: XR Elbow 2 Views Right  Hand/UE Inj: R elbow for lateral epicondylitis on 03/26/2018 3:10 PM Medications: 3 mL lidocaine 1 %; 40 mg methylPREDNISolone acetate 40 MG/ML Outcome: tolerated well, no immediate complications Consent was given by the patient. Patient was prepped and draped in the usual sterile fashion.    Plan: Discussed with him physical therapy he requested an injection and therefore the injection as stated above was performed.  However if he does not respond well with the injection would recommend formal physical therapy.  It showed him some lifting techniques and some exercises he can do at home.  He will follow-up with  [place] on as-needed basis.

## 2018-05-12 ENCOUNTER — Ambulatory Visit: Payer: Medicare HMO | Admitting: Cardiovascular Disease

## 2018-05-16 ENCOUNTER — Other Ambulatory Visit: Payer: Self-pay | Admitting: Cardiology

## 2018-05-16 DIAGNOSIS — I25119 Atherosclerotic heart disease of native coronary artery with unspecified angina pectoris: Secondary | ICD-10-CM

## 2018-05-19 ENCOUNTER — Ambulatory Visit: Payer: Medicare HMO | Admitting: Cardiovascular Disease

## 2018-05-19 ENCOUNTER — Encounter

## 2018-05-19 ENCOUNTER — Encounter: Payer: Self-pay | Admitting: Cardiovascular Disease

## 2018-05-19 VITALS — BP 148/74 | HR 72 | Ht 72.0 in | Wt 262.0 lb

## 2018-05-19 DIAGNOSIS — I25118 Atherosclerotic heart disease of native coronary artery with other forms of angina pectoris: Secondary | ICD-10-CM

## 2018-05-19 MED ORDER — CLOPIDOGREL BISULFATE 75 MG PO TABS: 75.0000 mg | ORAL_TABLET | Freq: Every day | ORAL | 3 refills | Status: DC

## 2018-05-19 MED ORDER — PANTOPRAZOLE SODIUM 40 MG PO TBEC: 40.0000 mg | DELAYED_RELEASE_TABLET | Freq: Every day | ORAL | 3 refills | Status: AC

## 2018-05-19 NOTE — Patient Instructions (Addendum)
Medication Instructions:  1) START PLAVIX 75 mg daily  2) STOP OMEPRAZOLE 3) START PROTONIX 40 mg daily  Labwork: TODAY: BMET, CBC  Testing/Procedures: Your physician has requested that you have a cardiac catheterization. Cardiac catheterization is used to diagnose and/or treat various heart conditions. Doctors may recommend this procedure for a number of different reasons. The most common reason is to evaluate chest pain. Chest pain can be a symptom of coronary artery disease (CAD), and cardiac catheterization can show whether plaque is narrowing or blocking your heart's arteries. This procedure is also used to evaluate the valves, as well as measure the blood flow and oxygen levels in different parts of your heart. For further information please visit [email or web address]. Please follow instruction sheet, as given.  Follow-Up: Your provider recommends that you schedule a follow-up appointment AS NEEDED with Dr. Burt Knack pending catheterization results.  Any Other Special Instructions Will Be Listed Below (If Applicable).   [facility] OFFICE [address], [address] [place] [address] Dept: [phone] Loc: [phone]  Allen James  05/19/2018  You are scheduled for a Cardiac Catheterization on Friday, July 5 with Dr. Sherren Mocha.  1. Please arrive at the [facility] (Main Entrance A) at [facility]: [address] [place], [place] [address] at 5:30 AM (two hours before your procedure to ensure your preparation). Free valet parking service is available.   Special note: Every effort is made to have your procedure done on time. Please understand that emergencies sometimes delay scheduled procedures.  2. Diet: No solid foods after midnight. You may have clear liquids until 5:00AM the morning of your catheterization.  3. Labs:TODAY.  4. Medication instructions in preparation for your  procedure:  1) MAKE SURE TO TAKE ASPIRIN AND PLAVIX the morning of your catheterization.  2) You may take all other medications as directed  5. Plan for one night stay--bring personal belongings. 6. Bring a current list of your medications and current insurance cards. 7. You MUST have a responsible person to drive you home. 8. Someone MUST be with you the first 24 hours after you arrive home or your discharge will be delayed. 9. Please wear clothes that are easy to get on and off and wear slip-on shoes.  Thank you for allowing [place] to care for you!   -- [facility]

## 2018-05-19 NOTE — H&P (View-Only) (Signed)
Cardiology Office Note Date:  05/21/2018   ID:  Allen James, DOB November 29, 1940, MRN [id number]  PCP:  Delilah Shan, MD  Cardiologist:  Sherren Mocha, MD    Chief Complaint  Patient presents with  . Chest Pain     History of Present Illness: Allen James is a 77 y.o. male who presents for follow-up of coronary artery disease with chronic angina.  The patient initially presented in 2015 with non-STEMI and was found to have critical stenosis of the RCA treated with overlapping drug-eluting stents followed by staged PCI of the first OM branch to the left circumflex the.  LVEF has been normal by echo studies.  He returned last year with recurrent angina with exertion.  He was managed medically with an increase in his antianginal medicines.  Symptoms have improved slightly, but he continues to have significant limitation with substernal chest pressure radiating to the left arm associated with physical activity.  This occurs when he "gets excited" or with physical activities such as pulling his trash can to the curb.  Symptoms resolve with rest.  He has not required nitroglycerin for sublingual use.  There is associated shortness of breath but no lightheadedness or near syncope.  He denies orthopnea, PND, or leg swelling.  He is compliant with his medications.   Past Medical History:  Diagnosis Date  . Abnormal findings on cardiac catheterization    ! Do NOT use RIGHT radial access on future caths or PCI.  Marland Kitchen Acute non-ST elevation myocardial infarction (NSTEMI) ([place]) 04/12/2014  . Acute non-ST segment elevation myocardial infarction ([place]) 04/12/2014  . Adiposity 04/14/2014  . AKI (acute kidney injury) ([place]) 04/14/2014  . Arthritis   . Back pain   . Benign essential hypertension 12/20/2016  . CAD (coronary artery disease)    a. NSTEMI 04/12/14 s/p DES x 2 to RCA. Prox 3.5x38 mm Xience Alpine DES, Dist 3.5x28 mm Xience DES; EF 65-70%. Staged PCI to the OM1 with 2.75 x 12 mm Resolute DES      . Chronic coronary artery disease 04/14/2014  . Chronic lower back pain 07/13/2014  . Dyspepsia 03/07/2016  . GERD (gastroesophageal reflux disease)   . H/O hiatal hernia   . Hyperlipidemia   . Injury of kidney 04/14/2014  . Muscle soreness 04/27/2014  . NSTEMI (non-ST elevated myocardial infarction) ([place]) 04/12/2014  . Obesity 04/14/2014  . Obstructive apnea 12/28/2014  . OSA on CPAP   . Osteoarthrosis 09/30/2014  . Primary insomnia 03/07/2016  . Pure hypercholesterolemia 12/28/2014  . SLEEP APNEA 02/24/2009   Qualifier: Diagnosis of  By: Annamaria Boots MD, Kasandra Knudsen     Past Surgical History:  Procedure Laterality Date  . APPENDECTOMY     Remote  . CARPAL TUNNEL RELEASE Bilateral   . CORONARY ANGIOPLASTY  04/12/14   STENT TO RCA  . KNEE ARTHROSCOPY Bilateral   . LEFT HEART CATHETERIZATION WITH CORONARY ANGIOGRAM N/A 04/12/2014   Procedure: LEFT HEART CATHETERIZATION WITH CORONARY ANGIOGRAM;  Surgeon: Blane Ohara, MD;  Location: [facility] CATH LAB;  Service: Cardiovascular;  Laterality: N/A;  . PERCUTANEOUS CORONARY STENT INTERVENTION (PCI-S)  04/12/2014   Procedure: PERCUTANEOUS CORONARY STENT INTERVENTION (PCI-S);  Surgeon: Blane Ohara, MD;  Location: [facility] CATH LAB;  Service: Cardiovascular;;  . PERCUTANEOUS CORONARY STENT INTERVENTION (PCI-S) N/A 04/14/2014   Procedure: PERCUTANEOUS CORONARY STENT INTERVENTION (PCI-S);  Surgeon: Peter M Martinique, MD;  Location: [facility] CATH LAB;  Service: Cardiovascular;  Laterality: N/A;    Current Outpatient Medications  Medication Sig Dispense Refill  . amLODipine (NORVASC) 5 MG tablet Take 1 tablet (5 mg total) by mouth daily. 30 tablet 6  . aspirin EC 81 MG tablet Take 81 mg by mouth daily.    Marland Kitchen docusate sodium (COLACE) 250 MG capsule Take 250 mg by mouth daily.    Marland Kitchen HYDROcodone-acetaminophen (NORCO/VICODIN) 5-325 MG per tablet Take 1 tablet by mouth 2 (two) times daily as needed for moderate pain.     Marland Kitchen lovastatin (MEVACOR) 20 MG tablet Take 1 tablet (20 mg  total) by mouth at bedtime. 30 tablet 11  . meloxicam (MOBIC) 15 MG tablet Take 15 mg by mouth daily.  1  . metoprolol succinate (TOPROL-XL) 50 MG 24 hr tablet Take 1 tablet (50 mg total) by mouth daily. 30 tablet 6  . nitroGLYCERIN (NITROSTAT) 0.4 MG SL tablet Place 1 tablet (0.4 mg total) under the tongue every 5 (five) minutes as needed for chest pain. 25 tablet 5  . Omega-3 1000 MG CAPS Take 1,000 mg by mouth 2 (two) times daily.    Marland Kitchen RA KRILL OIL 500 MG CAPS Take 500 mg by mouth daily.    . traMADol (ULTRAM) 50 MG tablet Take 100 mg by mouth every 12 (twelve) hours as needed (pain).     Marland Kitchen clopidogrel (PLAVIX) 75 MG tablet Take 1 tablet (75 mg total) by mouth daily. 90 tablet 3  . isosorbide mononitrate (IMDUR) 120 MG 24 hr tablet Take 1 tablet (120 mg total) by mouth daily. 90 tablet 3  . pantoprazole (PROTONIX) 40 MG tablet Take 1 tablet (40 mg total) by mouth daily. 90 tablet 3   No current facility-administered medications for this visit.     Allergies:   Patient has no known allergies.   Social History:  The patient  reports that he quit smoking about 35 years ago. He has never used smokeless tobacco. He reports that he does not drink alcohol or use drugs.   Family History:  The patient's  family history includes Coronary artery disease (age of onset: 1) in his mother.    ROS:  Please see the history of present illness.  Otherwise, review of systems is positive for easy bruising, excessive fatigue.  All other systems are reviewed and negative.    PHYSICAL EXAM: VS:  BP (!) 148/74   Pulse 72   Ht 6' (1.829 m)   Wt 262 lb (118.8 kg)   SpO2 96%   BMI 35.53 kg/m  , BMI Body mass index is 35.53 kg/m. GEN: Well nourished, well developed, in no acute distress  HEENT: normal  Neck: no JVD, no masses. No carotid bruits Cardiac: RRR with grade 2/6 systolic ejection murmur at the right upper sternal border              Respiratory:  clear to auscultation bilaterally, normal work  of breathing GI: soft, nontender, nondistended, + BS MS: no deformity or atrophy  Ext: Trace bilateral pretibial edema, pedal pulses 2+= bilaterally Skin: warm and dry, no rash Neuro:  Strength and sensation are intact Psych: euthymic mood, full affect  EKG:  EKG is ordered today. The ekg ordered today shows normal sinus rhythm 67 bpm, first-degree AV block, otherwise within normal limits.  Recent Labs: 05/19/2018: BUN 17; Creatinine, Ser 1.09; Hemoglobin 14.3; Platelets 182; Potassium 4.4; Sodium 142   Lipid Panel     Component Value Date/Time   CHOL 183 05/03/2016 0859   TRIG 147 05/03/2016 0859   HDL 39 (  L) 05/03/2016 0859   CHOLHDL 4.7 05/03/2016 0859   VLDL 29 05/03/2016 0859   LDLCALC 115 05/03/2016 0859      Wt Readings from Last 3 Encounters:  05/19/18 262 lb (118.8 kg)  06/28/17 273 lb (123.8 kg)  05/22/17 270 lb 12.8 oz (122.8 kg)     ASSESSMENT AND PLAN: Coronary artery disease, native vessel, with angina: The patient reports stable anginal symptoms but he does have significant limitation, currently CCS functional class III.  He is on good medical program with amlodipine, metoprolol succinate, and isosorbide at high dose.  He continues on aspirin for antiplatelet therapy.  We discussed options of ongoing medical therapy versus further evaluation with cardiac catheterization and possible PCI.  Considering his significant symptoms on multidrug antianginal therapy, I have recommended cardiac catheterization.  He agrees to proceed.  Will plan for left radial access.  Will initiate clopidogrel 75 mg daily and schedule him at the next available time.  He is not having unstable symptoms.  Previous reports are reviewed and he has been treated with overlapping DES in the RCA and a single DES in the OM branch of the left circumflex.  I have reviewed the risks, indications, and alternatives to cardiac catheterization, possible angioplasty, and stenting with the patient. Risks  include but are not limited to bleeding, infection, vascular injury, stroke, myocardial infection, arrhythmia, kidney injury, radiation-related injury in the case of prolonged fluoroscopy use, emergency cardiac surgery, and death. The patient understands the risks of serious complication is 1-2 in 7628 with diagnostic cardiac cath and 1-2% or less with angioplasty/stenting.   Current medicines are reviewed with the patient today.  The patient does not have concerns regarding medicines.  Labs/ tests ordered today include:   Orders Placed This Encounter  Procedures  . Basic metabolic panel  . CBC with Differential/Platelet  . EKG 12-Lead    Disposition:   FU pending cath results  Signed, Sherren Mocha, MD  05/21/2018 10:15 PM    [facility] [address], [place], [place]  [address] Phone: [phone]; Fax: [phone]

## 2018-05-19 NOTE — Progress Notes (Signed)
Cardiology Office Note Date:  05/21/2018   ID:  [name], DOB May 12, 1941, MRN [id number]  PCP:  Delilah Shan, MD  Cardiologist:  Sherren Mocha, MD    Chief Complaint  Patient presents with  . Chest Pain     History of Present Illness: Allen James is a 77 y.o. male who presents for follow-up of coronary artery disease with chronic angina.  The patient initially presented in 2015 with non-STEMI and was found to have critical stenosis of the RCA treated with overlapping drug-eluting stents followed by staged PCI of the first OM branch to the left circumflex the.  LVEF has been normal by echo studies.  He returned last year with recurrent angina with exertion.  He was managed medically with an increase in his antianginal medicines.  Symptoms have improved slightly, but he continues to have significant limitation with substernal chest pressure radiating to the left arm associated with physical activity.  This occurs when he "gets excited" or with physical activities such as pulling his trash can to the curb.  Symptoms resolve with rest.  He has not required nitroglycerin for sublingual use.  There is associated shortness of breath but no lightheadedness or near syncope.  He denies orthopnea, PND, or leg swelling.  He is compliant with his medications.   Past Medical History:  Diagnosis Date  . Abnormal findings on cardiac catheterization    ! Do NOT use RIGHT radial access on future caths or PCI.  Marland Kitchen Acute non-ST elevation myocardial infarction (NSTEMI) ([place]) 04/12/2014  . Acute non-ST segment elevation myocardial infarction ([place]) 04/12/2014  . Adiposity 04/14/2014  . AKI (acute kidney injury) ([place]) 04/14/2014  . Arthritis   . Back pain   . Benign essential hypertension 12/20/2016  . CAD (coronary artery disease)    a. NSTEMI 04/12/14 s/p DES x 2 to RCA. Prox 3.5x38 mm Xience Alpine DES, Dist 3.5x28 mm Xience DES; EF 65-70%. Staged PCI to the OM1 with 2.75 x 12 mm Resolute DES      . Chronic coronary artery disease 04/14/2014  . Chronic lower back pain 07/13/2014  . Dyspepsia 03/07/2016  . GERD (gastroesophageal reflux disease)   . H/O hiatal hernia   . Hyperlipidemia   . Injury of kidney 04/14/2014  . Muscle soreness 04/27/2014  . NSTEMI (non-ST elevated myocardial infarction) ([place]) 04/12/2014  . Obesity 04/14/2014  . Obstructive apnea 12/28/2014  . OSA on CPAP   . Osteoarthrosis 09/30/2014  . Primary insomnia 03/07/2016  . Pure hypercholesterolemia 12/28/2014  . SLEEP APNEA 02/24/2009   Qualifier: Diagnosis of  By: Annamaria Boots MD, Kasandra Knudsen     Past Surgical History:  Procedure Laterality Date  . APPENDECTOMY     Remote  . CARPAL TUNNEL RELEASE Bilateral   . CORONARY ANGIOPLASTY  04/12/14   STENT TO RCA  . KNEE ARTHROSCOPY Bilateral   . LEFT HEART CATHETERIZATION WITH CORONARY ANGIOGRAM N/A 04/12/2014   Procedure: LEFT HEART CATHETERIZATION WITH CORONARY ANGIOGRAM;  Surgeon: Blane Ohara, MD;  Location: [facility] CATH LAB;  Service: Cardiovascular;  Laterality: N/A;  . PERCUTANEOUS CORONARY STENT INTERVENTION (PCI-S)  04/12/2014   Procedure: PERCUTANEOUS CORONARY STENT INTERVENTION (PCI-S);  Surgeon: Blane Ohara, MD;  Location: [facility] CATH LAB;  Service: Cardiovascular;;  . PERCUTANEOUS CORONARY STENT INTERVENTION (PCI-S) N/A 04/14/2014   Procedure: PERCUTANEOUS CORONARY STENT INTERVENTION (PCI-S);  Surgeon: Peter M Martinique, MD;  Location: [facility] CATH LAB;  Service: Cardiovascular;  Laterality: N/A;    Current Outpatient Medications  Medication Sig Dispense Refill  . amLODipine (NORVASC) 5 MG tablet Take 1 tablet (5 mg total) by mouth daily. 30 tablet 6  . aspirin EC 81 MG tablet Take 81 mg by mouth daily.    Marland Kitchen docusate sodium (COLACE) 250 MG capsule Take 250 mg by mouth daily.    Marland Kitchen HYDROcodone-acetaminophen (NORCO/VICODIN) 5-325 MG per tablet Take 1 tablet by mouth 2 (two) times daily as needed for moderate pain.     Marland Kitchen lovastatin (MEVACOR) 20 MG tablet Take 1 tablet (20 mg  total) by mouth at bedtime. 30 tablet 11  . meloxicam (MOBIC) 15 MG tablet Take 15 mg by mouth daily.  1  . metoprolol succinate (TOPROL-XL) 50 MG 24 hr tablet Take 1 tablet (50 mg total) by mouth daily. 30 tablet 6  . nitroGLYCERIN (NITROSTAT) 0.4 MG SL tablet Place 1 tablet (0.4 mg total) under the tongue every 5 (five) minutes as needed for chest pain. 25 tablet 5  . Omega-3 1000 MG CAPS Take 1,000 mg by mouth 2 (two) times daily.    Marland Kitchen RA KRILL OIL 500 MG CAPS Take 500 mg by mouth daily.    . traMADol (ULTRAM) 50 MG tablet Take 100 mg by mouth every 12 (twelve) hours as needed (pain).     Marland Kitchen clopidogrel (PLAVIX) 75 MG tablet Take 1 tablet (75 mg total) by mouth daily. 90 tablet 3  . isosorbide mononitrate (IMDUR) 120 MG 24 hr tablet Take 1 tablet (120 mg total) by mouth daily. 90 tablet 3  . pantoprazole (PROTONIX) 40 MG tablet Take 1 tablet (40 mg total) by mouth daily. 90 tablet 3   No current facility-administered medications for this visit.     Allergies:   Patient has no known allergies.   Social History:  The patient  reports that he quit smoking about 35 years ago. He has never used smokeless tobacco. He reports that he does not drink alcohol or use drugs.   Family History:  The patient's  family history includes Coronary artery disease (age of onset: 72) in his mother.    ROS:  Please see the history of present illness.  Otherwise, review of systems is positive for easy bruising, excessive fatigue.  All other systems are reviewed and negative.    PHYSICAL EXAM: VS:  BP (!) 148/74   Pulse 72   Ht 6' (1.829 m)   Wt 262 lb (118.8 kg)   SpO2 96%   BMI 35.53 kg/m  , BMI Body mass index is 35.53 kg/m. GEN: Well nourished, well developed, in no acute distress  HEENT: normal  Neck: no JVD, no masses. No carotid bruits Cardiac: RRR with grade 2/6 systolic ejection murmur at the right upper sternal border              Respiratory:  clear to auscultation bilaterally, normal work  of breathing GI: soft, nontender, nondistended, + BS MS: no deformity or atrophy  Ext: Trace bilateral pretibial edema, pedal pulses 2+= bilaterally Skin: warm and dry, no rash Neuro:  Strength and sensation are intact Psych: euthymic mood, full affect  EKG:  EKG is ordered today. The ekg ordered today shows normal sinus rhythm 67 bpm, first-degree AV block, otherwise within normal limits.  Recent Labs: 05/19/2018: BUN 17; Creatinine, Ser 1.09; Hemoglobin 14.3; Platelets 182; Potassium 4.4; Sodium 142   Lipid Panel     Component Value Date/Time   CHOL 183 05/03/2016 0859   TRIG 147 05/03/2016 0859   HDL 39 (  L) 05/03/2016 0859   CHOLHDL 4.7 05/03/2016 0859   VLDL 29 05/03/2016 0859   LDLCALC 115 05/03/2016 0859      Wt Readings from Last 3 Encounters:  05/19/18 262 lb (118.8 kg)  06/28/17 273 lb (123.8 kg)  05/22/17 270 lb 12.8 oz (122.8 kg)     ASSESSMENT AND PLAN: Coronary artery disease, native vessel, with angina: The patient reports stable anginal symptoms but he does have significant limitation, currently CCS functional class III.  He is on good medical program with amlodipine, metoprolol succinate, and isosorbide at high dose.  He continues on aspirin for antiplatelet therapy.  We discussed options of ongoing medical therapy versus further evaluation with cardiac catheterization and possible PCI.  Considering his significant symptoms on multidrug antianginal therapy, I have recommended cardiac catheterization.  He agrees to proceed.  Will plan for left radial access.  Will initiate clopidogrel 75 mg daily and schedule him at the next available time.  He is not having unstable symptoms.  Previous reports are reviewed and he has been treated with overlapping DES in the RCA and a single DES in the OM branch of the left circumflex.  I have reviewed the risks, indications, and alternatives to cardiac catheterization, possible angioplasty, and stenting with the patient. Risks  include but are not limited to bleeding, infection, vascular injury, stroke, myocardial infection, arrhythmia, kidney injury, radiation-related injury in the case of prolonged fluoroscopy use, emergency cardiac surgery, and death. The patient understands the risks of serious complication is 1-2 in 0923 with diagnostic cardiac cath and 1-2% or less with angioplasty/stenting.   Current medicines are reviewed with the patient today.  The patient does not have concerns regarding medicines.  Labs/ tests ordered today include:   Orders Placed This Encounter  Procedures  . Basic metabolic panel  . CBC with Differential/Platelet  . EKG 12-Lead    Disposition:   FU pending cath results  Signed, Sherren Mocha, MD  05/21/2018 10:15 PM    [facility] [address], [place], [place]  [address] Phone: [phone]; Fax: [phone]

## 2018-05-20 ENCOUNTER — Telehealth: Payer: Self-pay | Admitting: Cardiovascular Disease

## 2018-05-20 LAB — BASIC METABOLIC PANEL
BUN/Creatinine Ratio: 16 (ref 10–24)
BUN: 17 mg/dL (ref 8–27)
CO2: 24 mmol/L (ref 20–29)
Calcium: 9.5 mg/dL (ref 8.6–10.2)
Chloride: 105 mmol/L (ref 96–106)
Creatinine, Ser: 1.09 mg/dL (ref 0.76–1.27)
GFR calc Af Amer: 76 mL/min/{1.73_m2} (ref >59)
GFR calc non Af Amer: 66 mL/min/{1.73_m2} (ref >59)
Glucose: 88 mg/dL (ref 65–99)
Potassium: 4.4 mmol/L (ref 3.5–5.2)
Sodium: 142 mmol/L (ref 134–144)

## 2018-05-20 LAB — CBC WITH DIFFERENTIAL/PLATELET
Basophils Absolute: 0 10*3/uL (ref 0.0–0.2)
Basos: 0 %
EOS (ABSOLUTE): 0.2 10*3/uL (ref 0.0–0.4)
Eos: 2 %
Hematocrit: 43.7 % (ref 37.5–51.0)
Hemoglobin: 14.3 g/dL (ref 13.0–17.7)
Immature Grans (Abs): 0 10*3/uL (ref 0.0–0.1)
Immature Granulocytes: 0 %
Lymphocytes Absolute: 1.8 10*3/uL (ref 0.7–3.1)
Lymphs: 27 %
MCH: 31.7 pg (ref 26.6–33.0)
MCHC: 32.7 g/dL (ref 31.5–35.7)
MCV: 97 fL (ref 79–97)
Monocytes Absolute: 0.7 10*3/uL (ref 0.1–0.9)
Monocytes: 10 %
Neutrophils Absolute: 4.1 10*3/uL (ref 1.4–7.0)
Neutrophils: 61 %
Platelets: 182 10*3/uL (ref 150–450)
RBC: 4.51 x10E6/uL (ref 4.14–5.80)
RDW: 13.1 % (ref 12.3–15.4)
WBC: 6.7 10*3/uL (ref 3.4–10.8)

## 2018-05-20 NOTE — Telephone Encounter (Signed)
New Message   Pt c/o medication issue:  1. Name of Medication: Plavix   2. How are you currently taking this medication (dosage and times per day)? Take 1 tablet (75 mg total) by mouth daily.  3. Are you having a reaction (difficulty breathing--STAT)? no  4. What is your medication issue?  Pt's wife is calling, states that Burt Knack told him to start taking Plavix but she says the pharmacy([id number] - [place], [place] [address]) never received the prescription. Please call

## 2018-05-20 NOTE — Telephone Encounter (Signed)
Called [facility] and confirmed with pharmacist that the prescription was received and they will fill today.  Spoke with Ms. Quentin Cornwall and instructed her to have the patient go pick up the medication. She also states [name] did not come home with his insurance card yesterday.  Checked with the front desk- it is not in lost and found. She states she has ordered a new card. She was grateful for assistance.

## 2018-05-21 ENCOUNTER — Encounter: Payer: Self-pay | Admitting: Cardiovascular Disease

## 2018-05-21 ENCOUNTER — Other Ambulatory Visit: Payer: Self-pay | Admitting: *Deleted

## 2018-05-21 DIAGNOSIS — I25119 Atherosclerotic heart disease of native coronary artery with unspecified angina pectoris: Secondary | ICD-10-CM

## 2018-05-21 MED ORDER — ISOSORBIDE MONONITRATE ER 120 MG PO TB24: 120.0000 mg | ORAL_TABLET | Freq: Every day | ORAL | 3 refills | Status: DC

## 2018-05-27 ENCOUNTER — Telehealth: Payer: Self-pay | Admitting: *Deleted

## 2018-05-27 NOTE — Telephone Encounter (Signed)
Pt contacted pre-catheterization scheduled at [facility] for: Friday May 30, 2018 7:30 AM Verified arrival time and place: [place] Entrance A at: 5:30 AM  No solid food after midnight prior to cath, clear liquids until 5 AM day of procedure. Verified no known allergies. Verified no diabetes medications.  AM meds can be  taken pre-cath with sip of water including: ASA 81 mg Clopidogrel 75 mg   Confirmed patient has responsible person to drive home post procedure and observe patient for 24 hours: yes

## 2018-05-30 ENCOUNTER — Encounter (HOSPITAL_COMMUNITY)
Admission: RE | Disposition: A | Discharge status: Home / Self Care | Payer: Self-pay | Source: Ambulatory Visit | Attending: Cardiovascular Disease

## 2018-05-30 ENCOUNTER — Ambulatory Visit (HOSPITAL_COMMUNITY)
Admission: RE | Admit: 2018-05-30 | Discharge: 2018-05-30 | Disposition: A | Discharge status: Home / Self Care | Payer: Medicare HMO | Source: Ambulatory Visit | Attending: Cardiovascular Disease | Admitting: Cardiovascular Disease

## 2018-05-30 ENCOUNTER — Encounter (HOSPITAL_COMMUNITY): Payer: Self-pay | Admitting: Cardiovascular Disease

## 2018-05-30 ENCOUNTER — Encounter: Payer: Self-pay | Admitting: *Deleted

## 2018-05-30 ENCOUNTER — Other Ambulatory Visit: Payer: Self-pay | Admitting: *Deleted

## 2018-05-30 DIAGNOSIS — Z6835 Body mass index (BMI) 35.0-35.9, adult: Secondary | ICD-10-CM | POA: Insufficient documentation

## 2018-05-30 DIAGNOSIS — I10 Essential (primary) hypertension: Secondary | ICD-10-CM | POA: Diagnosis not present

## 2018-05-30 DIAGNOSIS — M1990 Unspecified osteoarthritis, unspecified site: Secondary | ICD-10-CM | POA: Diagnosis not present

## 2018-05-30 DIAGNOSIS — I25119 Atherosclerotic heart disease of native coronary artery with unspecified angina pectoris: Secondary | ICD-10-CM

## 2018-05-30 DIAGNOSIS — Z79899 Other long term (current) drug therapy: Secondary | ICD-10-CM | POA: Diagnosis not present

## 2018-05-30 DIAGNOSIS — K219 Gastro-esophageal reflux disease without esophagitis: Secondary | ICD-10-CM | POA: Insufficient documentation

## 2018-05-30 DIAGNOSIS — Z8249 Family history of ischemic heart disease and other diseases of the circulatory system: Secondary | ICD-10-CM | POA: Insufficient documentation

## 2018-05-30 DIAGNOSIS — Z87891 Personal history of nicotine dependence: Secondary | ICD-10-CM | POA: Insufficient documentation

## 2018-05-30 DIAGNOSIS — Z9889 Other specified postprocedural states: Secondary | ICD-10-CM | POA: Diagnosis not present

## 2018-05-30 DIAGNOSIS — I25110 Atherosclerotic heart disease of native coronary artery with unstable angina pectoris: Secondary | ICD-10-CM | POA: Insufficient documentation

## 2018-05-30 DIAGNOSIS — Z7982 Long term (current) use of aspirin: Secondary | ICD-10-CM | POA: Insufficient documentation

## 2018-05-30 DIAGNOSIS — I252 Old myocardial infarction: Secondary | ICD-10-CM | POA: Diagnosis not present

## 2018-05-30 DIAGNOSIS — E7800 Pure hypercholesterolemia, unspecified: Secondary | ICD-10-CM | POA: Diagnosis not present

## 2018-05-30 DIAGNOSIS — Z955 Presence of coronary angioplasty implant and graft: Secondary | ICD-10-CM | POA: Insufficient documentation

## 2018-05-30 DIAGNOSIS — I25118 Atherosclerotic heart disease of native coronary artery with other forms of angina pectoris: Secondary | ICD-10-CM | POA: Diagnosis not present

## 2018-05-30 DIAGNOSIS — Z7902 Long term (current) use of antithrombotics/antiplatelets: Secondary | ICD-10-CM | POA: Diagnosis not present

## 2018-05-30 DIAGNOSIS — E669 Obesity, unspecified: Secondary | ICD-10-CM | POA: Insufficient documentation

## 2018-05-30 DIAGNOSIS — G4733 Obstructive sleep apnea (adult) (pediatric): Secondary | ICD-10-CM | POA: Insufficient documentation

## 2018-05-30 DIAGNOSIS — I2510 Atherosclerotic heart disease of native coronary artery without angina pectoris: Secondary | ICD-10-CM

## 2018-05-30 HISTORY — PX: LEFT HEART CATH AND CORONARY ANGIOGRAPHY: CATH118249

## 2018-05-30 SURGERY — LEFT HEART CATH AND CORONARY ANGIOGRAPHY
Anesthesia: LOCAL

## 2018-05-30 MED ORDER — SODIUM CHLORIDE 0.9 % WEIGHT BASED INFUSION: 1.0000 mL/kg/h | INTRAVENOUS | Status: DC

## 2018-05-30 MED ORDER — CLOPIDOGREL BISULFATE 75 MG PO TABS: 75.0000 mg | ORAL_TABLET | ORAL | Status: DC

## 2018-05-30 MED ORDER — LIDOCAINE HCL (PF) 1 % IJ SOLN
INTRAMUSCULAR | Status: DC | PRN
  Administered 2018-05-30: 2 mL

## 2018-05-30 MED ORDER — ASPIRIN 81 MG PO CHEW: 81.0000 mg | CHEWABLE_TABLET | ORAL | Status: DC

## 2018-05-30 MED ORDER — HEPARIN SODIUM (PORCINE) 1000 UNIT/ML IJ SOLN
INTRAMUSCULAR | Status: DC | PRN
  Administered 2018-05-30: 6000 [IU] via INTRAVENOUS

## 2018-05-30 MED ORDER — SODIUM CHLORIDE 0.9% FLUSH: 3.0000 mL | INTRAVENOUS | Status: DC | PRN

## 2018-05-30 MED ORDER — IOHEXOL 350 MG/ML SOLN
INTRAVENOUS | Status: DC | PRN
  Administered 2018-05-30: 110 mL via INTRA_ARTERIAL

## 2018-05-30 MED ORDER — SODIUM CHLORIDE 0.9 % WEIGHT BASED INFUSION
3.0000 mL/kg/h | INTRAVENOUS | Status: AC
  Administered 2018-05-30: 3 mL/kg/h via INTRAVENOUS

## 2018-05-30 MED ORDER — HEPARIN (PORCINE) IN NACL 1000-0.9 UT/500ML-% IV SOLN
INTRAVENOUS | Status: AC
  Filled 2018-05-30: qty 1000

## 2018-05-30 MED ORDER — MIDAZOLAM HCL 2 MG/2ML IJ SOLN
INTRAMUSCULAR | Status: DC | PRN
  Administered 2018-05-30: 1 mg via INTRAVENOUS
  Administered 2018-05-30: 2 mg via INTRAVENOUS

## 2018-05-30 MED ORDER — SODIUM CHLORIDE 0.9 % IV SOLN: 250.0000 mL | INTRAVENOUS | Status: DC | PRN

## 2018-05-30 MED ORDER — SODIUM CHLORIDE 0.9% FLUSH: 3.0000 mL | Freq: Two times a day (BID) | INTRAVENOUS | Status: DC

## 2018-05-30 MED ORDER — MIDAZOLAM HCL 2 MG/2ML IJ SOLN
INTRAMUSCULAR | Status: AC
  Filled 2018-05-30: qty 2

## 2018-05-30 MED ORDER — VERAPAMIL HCL 2.5 MG/ML IV SOLN
INTRAVENOUS | Status: AC
  Filled 2018-05-30: qty 2

## 2018-05-30 MED ORDER — FENTANYL CITRATE (PF) 100 MCG/2ML IJ SOLN
INTRAMUSCULAR | Status: DC | PRN
  Administered 2018-05-30 (×2): 25 ug via INTRAVENOUS

## 2018-05-30 MED ORDER — SODIUM CHLORIDE 0.9 % IV SOLN
INTRAVENOUS | Status: DC | PRN
  Administered 2018-05-30: 10 mL/h via INTRAVENOUS

## 2018-05-30 MED ORDER — ACETAMINOPHEN 325 MG PO TABS: 650.0000 mg | ORAL_TABLET | ORAL | Status: DC | PRN

## 2018-05-30 MED ORDER — FENTANYL CITRATE (PF) 100 MCG/2ML IJ SOLN
INTRAMUSCULAR | Status: AC
  Filled 2018-05-30: qty 2

## 2018-05-30 MED ORDER — HEPARIN (PORCINE) IN NACL 2-0.9 UNITS/ML
INTRAMUSCULAR | Status: AC | PRN
  Administered 2018-05-30 (×2): 500 mL

## 2018-05-30 MED ORDER — HEPARIN SODIUM (PORCINE) 1000 UNIT/ML IJ SOLN
INTRAMUSCULAR | Status: AC
  Filled 2018-05-30: qty 1

## 2018-05-30 MED ORDER — ONDANSETRON HCL 4 MG/2ML IJ SOLN: 4.0000 mg | Freq: Four times a day (QID) | INTRAMUSCULAR | Status: DC | PRN

## 2018-05-30 MED ORDER — VERAPAMIL HCL 2.5 MG/ML IV SOLN
INTRAVENOUS | Status: DC | PRN
  Administered 2018-05-30: 10 mL via INTRA_ARTERIAL

## 2018-05-30 MED ORDER — LIDOCAINE HCL (PF) 1 % IJ SOLN
INTRAMUSCULAR | Status: AC
  Filled 2018-05-30: qty 30

## 2018-05-30 SURGICAL SUPPLY — 14 items
CATH INFINITI 5 FR 3DRC (CATHETERS) ×1 IMPLANT
CATH INFINITI 5 FR AR1 MOD (CATHETERS) ×1 IMPLANT
CATH INFINITI 5FR MULTPACK ANG (CATHETERS) ×1 IMPLANT
COVER PRB 48X5XTLSCP FOLD TPE (BAG) IMPLANT
COVER PROBE 5X48 (BAG) ×2
DEVICE RAD COMP TR BAND LRG (VASCULAR PRODUCTS) ×1 IMPLANT
GLIDESHEATH SLEND SS 6F .021 (SHEATH) ×1 IMPLANT
GUIDEWIRE INQWIRE 1.5J.035X260 (WIRE) IMPLANT
INQWIRE 1.5J .035X260CM (WIRE) ×2
KIT HEART LEFT (KITS) ×2 IMPLANT
PACK CARDIAC CATHETERIZATION (CUSTOM PROCEDURE TRAY) ×2 IMPLANT
SYR MEDRAD MARK V 150ML (SYRINGE) ×2 IMPLANT
TRANSDUCER W/STOPCOCK (MISCELLANEOUS) ×2 IMPLANT
TUBING CIL FLEX 10 FLL-RA (TUBING) ×2 IMPLANT

## 2018-05-30 NOTE — Interval H&P Note (Signed)
Cath Lab Visit (complete for each Cath Lab visit)  Clinical Evaluation Leading to the Procedure:   ACS: No.  Non-ACS:    Anginal Classification: CCS III  Anti-ischemic medical therapy: Maximal Therapy (2 or more classes of medications)  Non-Invasive Test Results: No non-invasive testing performed  Prior CABG: No previous CABG      History and Physical Interval Note:  05/30/2018 7:37 AM  Allen James  has presented today for surgery, with the diagnosis of cad - angina  The various methods of treatment have been discussed with the patient and family. After consideration of risks, benefits and other options for treatment, the patient has consented to  Procedure(s): LEFT HEART CATH AND CORONARY ANGIOGRAPHY (N/A) as a surgical intervention .  The patient's history has been reviewed, patient examined, no change in status, stable for surgery.  I have reviewed the patient's chart and labs.  Questions were answered to the patient's satisfaction.     Sherren Mocha

## 2018-05-30 NOTE — Discharge Instructions (Signed)

## 2018-05-30 NOTE — Consult Note (Signed)
Allen James       [place] [address]             [phone]        Allen James  Medical Record #[id number] Date of Birth: [birth year]  Referring: No ref. provider found Primary Care: Delilah Shan, MD Primary Cardiologist:Michael Burt Knack, MD  Chief Complaint: Unstable angina, exertional chest pain  History of Present Illness:     Patient examined, coronary angiogram images personally reviewed and counseled with patient, patient discussed with Dr. Burt Knack for coordination of care.  Very nice 77 year old nondiabetic male presents with recurrent angina after undergoing PCI in 2015 when he presented with acute myocardial infarction.  At that time the RCA was opened with several drug-eluting stents.  A stent was also placed in the circumflex.  He initially took [name] now has been taking Plavix he has had significant bruising and bleeding problems with both antiplatelet agents.  Recently the patient has had increased terms of chest discomfort with exertion, now increasing in frequency and severity.  No nocturnal or resting symptoms.  No symptoms of CHF including PND or orthopnea.  Because of his recurrent angina patient underwent cardiac catheterization today via left radial artery.  He had persistent bleeding from the arterial stick site.  Findings the catheter demonstrated a ostial 90% LAD stenosis, moderate stenosis of the diagonal, and stenosis of the OM1 branch prior to a stent.  LVEDP was 20.  EF appears to be normal.  The RCA stents are patent with what appears to be insignificant disease in the right coronary artery.  The patient's cardiologist has recommended coronary bypass grafting as his best long-term therapy.  I agree with that recommendation and the patient will be scheduled for surgery on July 12 after a Plavix washout.  Patient has been healthy for extended period time.  His only previous operation was appendectomy.  He is undergone  outpatient carpal tunnel surgery and knee arthroscopy.  Patient has sleep apnea but is a non-smoker.  He is a nondiabetic and creatinine is.  He is not anemic.    Patient lives with his wife very active doing yard work and golf activities.  At the time of surgery this patient's most appropriate activity status/level should be described as: []     0    Normal activity, no symptoms [x]     1    Restricted in physical strenuous activity but ambulatory, able to do out light work []     2    Ambulatory and capable of self care, unable to do work activities, up and about                 more than 50%  Of the time                            []     3    Only limited self care, in bed greater than 50% of waking hours []     4    Completely disabled, no self care, confined to bed or chair []     5    Moribund  Past Medical History:  Diagnosis Date  . Abnormal findings on cardiac catheterization    ! Do NOT use RIGHT radial access on future caths or PCI.  Marland Kitchen Acute non-ST elevation myocardial infarction (NSTEMI) ([place]) 04/12/2014  . Acute non-ST segment elevation myocardial infarction ([place]) 04/12/2014  . Adiposity  04/14/2014  . AKI (acute kidney injury) ([place]) 04/14/2014  . Arthritis   . Back pain   . Benign essential hypertension 12/20/2016  . CAD (coronary artery disease)    a. NSTEMI 04/12/14 s/p DES x 2 to RCA. Prox 3.5x38 mm Xience Alpine DES, Dist 3.5x28 mm Xience DES; EF 65-70%. Staged PCI to the OM1 with 2.75 x 12 mm Resolute DES     . Chronic coronary artery disease 04/14/2014  . Chronic lower back pain 07/13/2014  . Dyspepsia 03/07/2016  . GERD (gastroesophageal reflux disease)   . H/O hiatal hernia   . Hyperlipidemia   . Injury of kidney 04/14/2014  . Muscle soreness 04/27/2014  . NSTEMI (non-ST elevated myocardial infarction) ([place]) 04/12/2014  . Obesity 04/14/2014  . Obstructive apnea 12/28/2014  . OSA on CPAP   . Osteoarthrosis 09/30/2014  . Primary insomnia 03/07/2016  . Pure hypercholesterolemia  12/28/2014  . SLEEP APNEA 02/24/2009   Qualifier: Diagnosis of  By: Annamaria Boots MD, Kasandra Knudsen     Past Surgical History:  Procedure Laterality Date  . APPENDECTOMY     Remote  . CARPAL TUNNEL RELEASE Bilateral   . CORONARY ANGIOPLASTY  04/12/14   STENT TO RCA  . KNEE ARTHROSCOPY Bilateral   . LEFT HEART CATHETERIZATION WITH CORONARY ANGIOGRAM N/A 04/12/2014   Procedure: LEFT HEART CATHETERIZATION WITH CORONARY ANGIOGRAM;  Surgeon: Blane Ohara, MD;  Location: [facility] CATH LAB;  Service: Cardiovascular;  Laterality: N/A;  . PERCUTANEOUS CORONARY STENT INTERVENTION (PCI-S)  04/12/2014   Procedure: PERCUTANEOUS CORONARY STENT INTERVENTION (PCI-S);  Surgeon: Blane Ohara, MD;  Location: [facility] CATH LAB;  Service: Cardiovascular;;  . PERCUTANEOUS CORONARY STENT INTERVENTION (PCI-S) N/A 04/14/2014   Procedure: PERCUTANEOUS CORONARY STENT INTERVENTION (PCI-S);  Surgeon: Shylah Dossantos M Martinique, MD;  Location: [facility] CATH LAB;  Service: Cardiovascular;  Laterality: N/A;    Social History   Tobacco Use  Smoking Status Former Smoker  . Last attempt to quit: 11/26/1982  . Years since quitting: 35.5  Smokeless Tobacco Never Used    Social History   Substance and Sexual Activity  Alcohol Use No     No Known Allergies  Current Facility-Administered Medications  Medication Dose Route Frequency Provider Last Rate Last Dose  . 0.9 %  sodium chloride infusion  250 mL Intravenous PRN Sherren Mocha, MD      . 0.9 %  sodium chloride infusion  250 mL Intravenous PRN Sherren Mocha, MD      . 0.9% sodium chloride infusion  1 mL/kg/hr Intravenous Continuous Sherren Mocha, MD 118.8 mL/hr at 05/30/18 0711 1 mL/kg/hr at 05/30/18 1610  . 0.9% sodium chloride infusion  1 mL/kg/hr Intravenous Continuous Sherren Mocha, MD      . acetaminophen (TYLENOL) tablet 650 mg  650 mg Oral Q4H PRN Sherren Mocha, MD      . aspirin chewable tablet 81 mg  81 mg Oral Brock Ra, Legrand Como, MD      . clopidogrel (PLAVIX) tablet 75 mg   75 mg Oral Brock Ra, Legrand Como, MD      . ondansetron [facility]) injection 4 mg  4 mg Intravenous Q6H PRN Sherren Mocha, MD      . sodium chloride flush (NS) 0.9 % injection 3 mL  3 mL Intravenous Q12H Sherren Mocha, MD      . sodium chloride flush (NS) 0.9 % injection 3 mL  3 mL Intravenous PRN Sherren Mocha, MD      . sodium chloride flush (NS) 0.9 % injection 3  mL  3 mL Intravenous Janalee Dane, MD      . sodium chloride flush (NS) 0.9 % injection 3 mL  3 mL Intravenous PRN Sherren Mocha, MD        Medications Prior to Admission  Medication Sig Dispense Refill Last Dose  . amLODipine (NORVASC) 5 MG tablet Take 1 tablet (5 mg total) by mouth daily. 30 tablet 6 05/29/2018 at Unknown time  . aspirin EC 81 MG tablet Take 81 mg by mouth daily.   05/30/2018 at 0500  . clopidogrel (PLAVIX) 75 MG tablet Take 1 tablet (75 mg total) by mouth daily. 90 tablet 3 05/30/2018 at 0500  . docusate sodium (COLACE) 250 MG capsule Take 250 mg by mouth daily as needed for constipation.    Past Month at Unknown time  . HYDROcodone-acetaminophen (NORCO/VICODIN) 5-325 MG per tablet Take 1 tablet by mouth 3 (three) times daily as needed for moderate pain.    05/30/2018 at 0200  . isosorbide mononitrate (IMDUR) 120 MG 24 hr tablet Take 1 tablet (120 mg total) by mouth daily. 90 tablet 3 05/29/2018 at Unknown time  . lovastatin (MEVACOR) 20 MG tablet Take 1 tablet (20 mg total) by mouth at bedtime. (Patient taking differently: Take 20 mg by mouth daily. ) 30 tablet 11 05/29/2018 at Unknown time  . meloxicam (MOBIC) 15 MG tablet Take 15 mg by mouth daily.  1 05/29/2018 at Unknown time  . metoprolol succinate (TOPROL-XL) 50 MG 24 hr tablet Take 1 tablet (50 mg total) by mouth daily. 30 tablet 6 05/29/2018 at Unknown time  . nitroGLYCERIN (NITROSTAT) 0.4 MG SL tablet Place 1 tablet (0.4 mg total) under the tongue every 5 (five) minutes as needed for chest pain. 25 tablet 5 Past Month at Unknown time  . pantoprazole  (PROTONIX) 40 MG tablet Take 1 tablet (40 mg total) by mouth daily. 90 tablet 3 05/29/2018 at Unknown time  . traMADol (ULTRAM) 50 MG tablet Take 100 mg by mouth every 8 (eight) hours as needed for moderate pain.    05/30/2018 at 0200    Family History  Problem Relation Age of Onset  . Coronary artery disease Mother 63     Review of Systems:   ROS      Cardiac Review of Systems: Y or  [    ]= no  Chest Pain [ y   ]  Resting SOB [   ] Exertional SOB  [ y]  Orthopnea [  ]   Pedal Edema [   ]    Palpitations [  ] Syncope  [  ]   Presyncope [   ]  General Review of Systems: [Y] = yes [  ]=no Constitional: recent weight change [  ]; anorexia [  ]; fatigue [  ]; nausea [  ]; night sweats [  ]; fever [  ]; or chills [  ]                                                               Dental: Last Dentist visit: One year  Eye : blurred vision [  ]; diplopia [   ]; vision changes [  ];  Amaurosis fugax[  ]; Resp: cough [  ];  wheezing[  ];  hemoptysis[  ]; shortness of breath[  ]; paroxysmal nocturnal dyspnea[  ]; dyspnea on exertion[y  ]; or orthopnea[  ];  GI:  gallstones[  ], vomiting[  ];  dysphagia[  ]; melena[  ];  hematochezia [  ]; heartburn[  ];   Hx of  Colonoscopy[  ]; GU: kidney stones [  ]; hematuria[  ];   dysuria [  ];  nocturia[  ];  history of     obstruction [  ]; urinary frequency [  ]             Skin: rash, swelling[  ];, hair loss[  ];  peripheral edema[  ];  or itching[  ]; Musculosketetal: myalgias[  ];  joint swelling[  ];  joint erythema[  ];  joint pain[ y ];  back pain[  ];  Heme/Lymph: bruising[  ];  bleeding[  ];  anemia[  ];  Neuro: TIA[  ];  headaches[  ];  stroke[  ];  vertigo[  ];  seizures[  ];   paresthesias[  ];  difficulty walking[  ];  Psych:depression[  ]; anxiety[  ];  Endocrine: diabetes[  ];  thyroid dysfunction[  ]; Right-hand-dominant No previous thoracic trauma or pneumothorax               Physical Exam: BP 110/78   Pulse 65   Temp 97.8 F  (36.6 C) (Oral)   Resp (!) 21   Ht 6' (1.829 m)   Wt 260 lb (117.9 kg)   SpO2 97%   BMI 35.26 kg/m         Exam    General- alert and comfortable   HEENTnormocephalic, dentition in good repair    Neck- no JVD, no cervical adenopathy palpable, no carotid bruit   Lungs- clear without rales, wheezes   Cor- regular rate and rhythm, no murmur , gallop   Abdomen- soft, non-tender   Extremities - warm, non-tender, minimal edema.  Bleeding from the left wrist cath site   Neuro- oriented, appropriate, no focal weakness   Diagnostic Studies & Laboratory data:     Recent Radiology Findings:   No results found.   I have independently reviewed the above radiologic studies and discussed with the patient   Recent Lab Findings: Lab Results  Component Value Date   WBC 6.7 05/19/2018   HGB 14.3 05/19/2018   HCT 43.7 05/19/2018   PLT 182 05/19/2018   GLUCOSE 88 05/19/2018   CHOL 183 05/03/2016   TRIG 147 05/03/2016   HDL 39 (L) 05/03/2016   LDLCALC 115 05/03/2016   ALT 19 05/03/2016   AST 17 05/03/2016   NA 142 05/19/2018   K 4.4 05/19/2018   CL 105 05/19/2018   CREATININE 1.09 05/19/2018   BUN 17 05/19/2018   CO2 24 05/19/2018   INR 1.00 04/14/2014      Assessment / Plan:   Unstable angina with recurrent severe CAD valley LAD diagonal and OM1. Preserved LV function Platelet dysfunction with clinical bleeding problems on Plavix  Patient would benefit from multivessel CABG with bypass grafts to the LAD diagonal and OM1.  Prior to surgery he will need Plavix washout.  I discussed the procedure of CABG in detail with patient and his wife.  They understand the expected benefits, the potential risks of surgery, and the alternatives.  He will stop his Plavix now and will be scheduled for CABG on July 12 at [facility].  He will return for preoperative assessment  by anesthesia, pre-CABG Dopplers, and chest x-ray.     @ME1 @ 05/30/2018 12:41 PM

## 2018-06-02 MED FILL — Heparin Sod (Porcine)-NaCl IV Soln 1000 Unit/500ML-0.9%: INTRAVENOUS | Qty: 1000 | Status: AC

## 2018-06-04 NOTE — Pre-Procedure Instructions (Signed)
Allen James  06/04/2018      [id number] - [place], [place] - [address] AT [address] [place]. [place] [address] Phone: [phone] Fax: [phone]    Your procedure is scheduled on Friday July 12.  Report to [facility] Admitting at 5:30 A.M.  Call this number if you have problems the morning of surgery:  [phone]   Remember:  Do not eat or drink after midnight.    Take these medicines the morning of surgery with A SIP OF WATER:   Amlodipine (norvasc) Metoprolol (Toprol XL) Isosorbide (Imdur) Pantoprazole (Protonix) Tramadol (Ultram) if needed OR Hydrocodone-acetaminophen (norco) if needed  7 days prior to surgery STOP taking any Aleve, Naproxen, Ibuprofen, Motrin, Advil, Goody's, BC's, all herbal medications, fish oil, and all vitamins     Do not wear jewelry, make-up or nail polish.  Do not wear lotions, powders, or perfumes, or deodorant.  Do not shave 48 hours prior to surgery.  Men may shave face and neck.  Do not bring valuables to the hospital.  [facility] is not responsible for any belongings or valuables.  Contacts, dentures or bridgework may not be worn into surgery.  Leave your suitcase in the car.  After surgery it may be brought to your room.  For patients admitted to the hospital, discharge time will be determined by your treatment team.  Patients discharged the day of surgery will not be allowed to drive home.   Special instructions:    [place]- Preparing For Surgery  Before surgery, you can play an important role. Because skin is not sterile, your skin needs to be as free of germs as possible. You can reduce the number of germs on your skin by washing with CHG (chlorahexidine gluconate) Soap before surgery.  CHG is an antiseptic cleaner which kills germs and bonds with the skin to continue killing germs even after washing.    Oral Hygiene is also important to reduce your  risk of infection.  Remember - BRUSH YOUR TEETH THE MORNING OF SURGERY WITH YOUR REGULAR TOOTHPASTE  Please do not use if you have an allergy to CHG or antibacterial soaps. If your skin becomes reddened/irritated stop using the CHG.  Do not shave (including legs and underarms) for at least 48 hours prior to first CHG shower. It is OK to shave your face.  Please follow these instructions carefully.   1. Shower the NIGHT BEFORE SURGERY and the MORNING OF SURGERY with CHG.   2. If you chose to wash your hair, wash your hair first as usual with your normal shampoo.  3. After you shampoo, rinse your hair and body thoroughly to remove the shampoo.  4. Use CHG as you would any other liquid soap. You can apply CHG directly to the skin and wash gently with a scrungie or a clean washcloth.   5. Apply the CHG Soap to your body ONLY FROM THE NECK DOWN.  Do not use on open wounds or open sores. Avoid contact with your eyes, ears, mouth and genitals (private parts). Wash Face and genitals (private parts)  with your normal soap.  6. Wash thoroughly, paying special attention to the area where your surgery will be performed.  7. Thoroughly rinse your body with warm water from the neck down.  8. DO NOT shower/wash with your normal soap after using and rinsing off the CHG Soap.  9. Pat yourself dry with a CLEAN TOWEL.  10. Wear CLEAN PAJAMAS to bed the night before surgery, wear comfortable clothes the morning of surgery  11. Place CLEAN SHEETS on your bed the night of your first shower and DO NOT SLEEP WITH PETS.    Day of Surgery:  Do not apply any deodorants/lotions.  Please wear clean clothes to the hospital/surgery center.   Remember to brush your teeth WITH YOUR REGULAR TOOTHPASTE.    Please read over the following fact sheets that you were given. Coughing and Deep Breathing, MRSA Information and Surgical Site Infection Prevention

## 2018-06-05 ENCOUNTER — Ambulatory Visit (HOSPITAL_COMMUNITY)
Admission: RE | Admit: 2018-06-05 | Discharge: 2018-06-05 | Disposition: A | Discharge status: Home / Self Care | Payer: Medicare HMO | Source: Ambulatory Visit | Attending: Cardiothoracic Surgery | Admitting: Cardiothoracic Surgery

## 2018-06-05 ENCOUNTER — Other Ambulatory Visit: Payer: Self-pay

## 2018-06-05 ENCOUNTER — Encounter (HOSPITAL_COMMUNITY): Payer: Self-pay

## 2018-06-05 ENCOUNTER — Ambulatory Visit (HOSPITAL_BASED_OUTPATIENT_CLINIC_OR_DEPARTMENT_OTHER)
Admission: RE | Admit: 2018-06-05 | Discharge: 2018-06-05 | Disposition: A | Discharge status: Home / Self Care | Payer: Medicare HMO | Source: Ambulatory Visit | Attending: Cardiothoracic Surgery | Admitting: Cardiothoracic Surgery

## 2018-06-05 ENCOUNTER — Encounter (HOSPITAL_COMMUNITY)
Admit: 2018-06-05 | Discharge: 2018-06-05 | Disposition: A | Discharge status: Home / Self Care | Payer: Medicare HMO | Attending: Cardiothoracic Surgery | Admitting: Cardiothoracic Surgery

## 2018-06-05 DIAGNOSIS — I2510 Atherosclerotic heart disease of native coronary artery without angina pectoris: Secondary | ICD-10-CM

## 2018-06-05 DIAGNOSIS — Z01818 Encounter for other preprocedural examination: Secondary | ICD-10-CM

## 2018-06-05 HISTORY — DX: Unspecified abdominal hernia without obstruction or gangrene: K46.9

## 2018-06-05 HISTORY — DX: Dyspnea, unspecified: R06.00

## 2018-06-05 LAB — COMPREHENSIVE METABOLIC PANEL
ALT: 23 U/L (ref 0–44)
AST: 24 U/L (ref 15–41)
Albumin: 3.8 g/dL (ref 3.5–5.0)
Alkaline Phosphatase: 74 U/L (ref 38–126)
Anion gap: 11 (ref 5–15)
BUN: 16 mg/dL (ref 8–23)
CO2: 19 mmol/L — ABNORMAL LOW (ref 22–32)
Calcium: 8.9 mg/dL (ref 8.9–10.3)
Chloride: 108 mmol/L (ref 98–111)
Creatinine, Ser: 1.05 mg/dL (ref 0.61–1.24)
GFR calc Af Amer: >60 mL/min (ref >60)
GFR calc non Af Amer: >60 mL/min (ref >60)
Glucose, Bld: 113 mg/dL — ABNORMAL HIGH (ref 70–99)
Potassium: 4.5 mmol/L (ref 3.5–5.1)
Sodium: 138 mmol/L (ref 135–145)
Total Bilirubin: 0.6 mg/dL (ref 0.3–1.2)
Total Protein: 6.8 g/dL (ref 6.5–8.1)

## 2018-06-05 LAB — HEMOGLOBIN A1C
Hgb A1c MFr Bld: 6 % — ABNORMAL HIGH (ref 4.8–5.6)
Mean Plasma Glucose: 125.5 mg/dL

## 2018-06-05 LAB — BLOOD GAS, ARTERIAL
Acid-base deficit: 0.5 mmol/L (ref 0.0–2.0)
Bicarbonate: 23.4 mmol/L (ref 20.0–28.0)
Drawn by: 470591
FIO2: 21
O2 Saturation: 97.4 %
Patient temperature: 98.6
pCO2 arterial: 37.4 mmHg (ref 32.0–48.0)
pH, Arterial: 7.413 (ref 7.350–7.450)
pO2, Arterial: 93.9 mmHg (ref 83.0–108.0)

## 2018-06-05 LAB — PULMONARY FUNCTION TEST
DL/VA % pred: 94 %
DL/VA: 4.39 ml/min/mmHg/L
DLCO cor % pred: 67 %
DLCO cor: 22.59 ml/min/mmHg
DLCO unc % pred: 65 %
DLCO unc: 22.2 ml/min/mmHg
FEF 25-75 Post: 2.49 L/sec
FEF 25-75 Pre: 3.01 L/sec
FEF2575-%Change-Post: -17 %
FEF2575-%Pred-Post: 110 %
FEF2575-%Pred-Pre: 133 %
FEV1-%Change-Post: -4 %
FEV1-%Pred-Post: 85 %
FEV1-%Pred-Pre: 89 %
FEV1-Post: 2.68 L
FEV1-Pre: 2.82 L
FEV1FVC-%Change-Post: 0 %
FEV1FVC-%Pred-Pre: 111 %
FEV6-%Change-Post: -4 %
FEV6-%Pred-Post: 81 %
FEV6-%Pred-Pre: 85 %
FEV6-Post: 3.35 L
FEV6-Pre: 3.51 L
FEV6FVC-%Pred-Post: 106 %
FEV6FVC-%Pred-Pre: 106 %
FVC-%Change-Post: -4 %
FVC-%Pred-Post: 77 %
FVC-%Pred-Pre: 80 %
FVC-Post: 3.36 L
FVC-Pre: 3.51 L
Post FEV1/FVC ratio: 80 %
Post FEV6/FVC ratio: 100 %
Pre FEV1/FVC ratio: 80 %
Pre FEV6/FVC Ratio: 100 %
RV % pred: 13 %
RV: 0.35 L
TLC % pred: 53 %
TLC: 3.91 L

## 2018-06-05 LAB — URINALYSIS, ROUTINE W REFLEX MICROSCOPIC
Bilirubin Urine: NEGATIVE
Glucose, UA: NEGATIVE mg/dL
Hgb urine dipstick: NEGATIVE
Ketones, ur: NEGATIVE mg/dL
Leukocytes, UA: NEGATIVE
Nitrite: NEGATIVE
Protein, ur: NEGATIVE mg/dL
Specific Gravity, Urine: 1.026 (ref 1.005–1.030)
pH: 5 (ref 5.0–8.0)

## 2018-06-05 LAB — CBC
HCT: 43 % (ref 39.0–52.0)
Hemoglobin: 14 g/dL (ref 13.0–17.0)
MCH: 31.7 pg (ref 26.0–34.0)
MCHC: 32.6 g/dL (ref 30.0–36.0)
MCV: 97.5 fL (ref 78.0–100.0)
Platelets: 159 10*3/uL (ref 150–400)
RBC: 4.41 MIL/uL (ref 4.22–5.81)
RDW: 11.8 % (ref 11.5–15.5)
WBC: 4.8 10*3/uL (ref 4.0–10.5)

## 2018-06-05 LAB — PROTIME-INR
INR: 0.99
Prothrombin Time: 13 seconds (ref 11.4–15.2)

## 2018-06-05 LAB — SURGICAL PCR SCREEN
MRSA, PCR: NEGATIVE
Staphylococcus aureus: NEGATIVE

## 2018-06-05 LAB — APTT: aPTT: 30 seconds (ref 24–36)

## 2018-06-05 MED ORDER — SODIUM CHLORIDE 0.9 % IV SOLN
1.5000 g | INTRAVENOUS | Status: AC
  Administered 2018-06-06: 1.5 g via INTRAVENOUS
  Filled 2018-06-05: qty 1.5

## 2018-06-05 MED ORDER — TRANEXAMIC ACID (OHS) BOLUS VIA INFUSION
15.0000 mg/kg | INTRAVENOUS | Status: AC
  Administered 2018-06-06: 1798.5 mg via INTRAVENOUS
  Filled 2018-06-05: qty 1799

## 2018-06-05 MED ORDER — POTASSIUM CHLORIDE 2 MEQ/ML IV SOLN
80.0000 meq | INTRAVENOUS | Status: DC
  Filled 2018-06-05: qty 40

## 2018-06-05 MED ORDER — MAGNESIUM SULFATE 50 % IJ SOLN
40.0000 meq | INTRAMUSCULAR | Status: DC
  Filled 2018-06-05: qty 9.85

## 2018-06-05 MED ORDER — EPINEPHRINE PF 1 MG/ML IJ SOLN
0.0000 ug/min | INTRAMUSCULAR | Status: DC
  Filled 2018-06-05: qty 4

## 2018-06-05 MED ORDER — SODIUM CHLORIDE 0.9 % IV SOLN
INTRAVENOUS | Status: AC
  Administered 2018-06-06: 1.2 [IU]/h via INTRAVENOUS
  Filled 2018-06-05: qty 1

## 2018-06-05 MED ORDER — SODIUM CHLORIDE 0.9 % IV SOLN
30.0000 ug/min | INTRAVENOUS | Status: AC
  Administered 2018-06-06: 50 ug/min via INTRAVENOUS
  Filled 2018-06-05: qty 2

## 2018-06-05 MED ORDER — SODIUM CHLORIDE 0.9 % IV SOLN
INTRAVENOUS | Status: DC
  Filled 2018-06-05: qty 30

## 2018-06-05 MED ORDER — DOPAMINE-DEXTROSE 3.2-5 MG/ML-% IV SOLN
0.0000 ug/kg/min | INTRAVENOUS | Status: AC
  Administered 2018-06-06: 2 ug/kg/min via INTRAVENOUS
  Filled 2018-06-05: qty 250

## 2018-06-05 MED ORDER — ALBUTEROL SULFATE (2.5 MG/3ML) 0.083% IN NEBU
2.5000 mg | INHALATION_SOLUTION | Freq: Once | RESPIRATORY_TRACT | Status: AC
  Administered 2018-06-05: 2.5 mg via RESPIRATORY_TRACT

## 2018-06-05 MED ORDER — TRANEXAMIC ACID (OHS) PUMP PRIME SOLUTION
2.0000 mg/kg | INTRAVENOUS | Status: DC
  Filled 2018-06-05: qty 2.4

## 2018-06-05 MED ORDER — PAPAVERINE HCL 30 MG/ML IJ SOLN
INTRAMUSCULAR | Status: AC
  Administered 2018-06-06: 500 mL
  Filled 2018-06-05: qty 2.5

## 2018-06-05 MED ORDER — TRANEXAMIC ACID 1000 MG/10ML IV SOLN
1.5000 mg/kg/h | INTRAVENOUS | Status: AC
  Administered 2018-06-06: 1.5 mg/kg/h via INTRAVENOUS
  Filled 2018-06-05: qty 25

## 2018-06-05 MED ORDER — NITROGLYCERIN IN D5W 200-5 MCG/ML-% IV SOLN
2.0000 ug/min | INTRAVENOUS | Status: AC
  Administered 2018-06-06: 10 ug/min via INTRAVENOUS
  Filled 2018-06-05: qty 250

## 2018-06-05 MED ORDER — MILRINONE LACTATE IN DEXTROSE 20-5 MG/100ML-% IV SOLN
0.1250 ug/kg/min | INTRAVENOUS | Status: DC
  Filled 2018-06-05: qty 100

## 2018-06-05 MED ORDER — DEXMEDETOMIDINE HCL IN NACL 400 MCG/100ML IV SOLN
0.1000 ug/kg/h | INTRAVENOUS | Status: AC
  Administered 2018-06-06: 0.7 ug/kg/h via INTRAVENOUS
  Filled 2018-06-05: qty 100

## 2018-06-05 MED ORDER — SODIUM CHLORIDE 0.9 % IV SOLN
750.0000 mg | INTRAVENOUS | Status: DC
  Filled 2018-06-05: qty 750

## 2018-06-05 MED ORDER — VANCOMYCIN HCL 10 G IV SOLR
1500.0000 mg | INTRAVENOUS | Status: AC
  Administered 2018-06-06: 1500 mg via INTRAVENOUS
  Filled 2018-06-05 (×2): qty 1500

## 2018-06-05 NOTE — Progress Notes (Signed)
Pre-op Cardiac Surgery  Carotid Findings:  Bilateral - 1% to 39% ICA stenosis. Vertebral artery flow is antegrade.  Upper Extremity Right Left  Brachial Pressures 129 Triphasic 128 Triphasic  Radial Waveforms Triphasic Triphasic  Ulnar Waveforms Triphasic Triphasic  Palmar Arch (Allen's Test) Normal Normal   Findings:  Palmar arch evaluation - Doppler waveforms remained normal bilaterally with both radial and ulnar compressions.    Lower  Extremity Right Left  Dorsalis Pedis 163 Triphasic 144Triphasic  Posterior Tibial 167 Triphasic 146 Triphasic  Ankle/Brachial Indices 1.29 1.13    Findings:  ABIs and Doppler waveforms are within normal limits bilaterally at rest.  [facility] 06/05/2018, 10:22 AM

## 2018-06-05 NOTE — Anesthesia Preprocedure Evaluation (Addendum)
Anesthesia Evaluation  Patient identified by MRN, date of birth, ID band Patient awake    Reviewed: Allergy & Precautions, NPO status , Patient's Chart, lab work & pertinent test results  History of Anesthesia Complications Negative for: history of anesthetic complications  Airway Mallampati: II  TM Distance: >3 FB Neck ROM: Full    Dental no notable dental hx. (+) Dental Advisory Given   Pulmonary sleep apnea and Continuous Positive Airway Pressure Ventilation , former smoker,    Pulmonary exam normal        Cardiovascular hypertension, + angina with exertion + CAD and + Past MI  Normal cardiovascular exam  1. Severe ostial LAD stenosis with mild to moderate proximal and mid LAD  stenosis 2. Patent left main with no significant stenosis 3. Patent left circumflex stent (first OM) with moderate stenosis  proximal to the stented segment 4. Patent RCA stents with mild to moderate stenosis just between the  stents in the mid RCA 5. Normal LV function     Neuro/Psych negative neurological ROS  negative psych ROS   GI/Hepatic Neg liver ROS, hiatal hernia, GERD  ,  Endo/Other  negative endocrine ROS  Renal/GU negative Renal ROS     Musculoskeletal negative musculoskeletal ROS (+)   Abdominal   Peds  Hematology negative hematology ROS (+)   Anesthesia Other Findings Day of surgery medications reviewed with the patient.  Reproductive/Obstetrics                            Anesthesia Physical Anesthesia Plan  ASA: III  Anesthesia Plan: General   Post-op Pain Management:    Induction: Intravenous  PONV Risk Score and Plan: 3 and Ondansetron and Dexamethasone  Airway Management Planned: Oral ETT  Additional Equipment: Arterial line, PA Cath, 3D TEE and Ultrasound Guidance Line Placement  Intra-op Plan:   Post-operative Plan: Post-operative intubation/ventilation  Informed Consent:  I have reviewed the patients History and Physical, chart, labs and discussed the procedure including the risks, benefits and alternatives for the proposed anesthesia with the patient or authorized representative who has indicated his/her understanding and acceptance.   Dental advisory given  Plan Discussed with: CRNA and Anesthesiologist  Anesthesia Plan Comments:        Anesthesia Quick Evaluation

## 2018-06-05 NOTE — Progress Notes (Signed)
PCP: Dr. Scarlette Ar @ [facility] in [place] Cardiologist: Dr. Sherren Mocha  Last sleep study >10 yrs.

## 2018-06-06 ENCOUNTER — Encounter (HOSPITAL_COMMUNITY)
Admission: RE | Disposition: A | Discharge status: Home / Self Care | Payer: Self-pay | Source: Home / Self Care | Attending: Cardiothoracic Surgery

## 2018-06-06 ENCOUNTER — Inpatient Hospital Stay (HOSPITAL_COMMUNITY): Payer: Medicare HMO | Admitting: Anesthesiology

## 2018-06-06 ENCOUNTER — Encounter (HOSPITAL_COMMUNITY): Payer: Self-pay | Admitting: Urology

## 2018-06-06 ENCOUNTER — Inpatient Hospital Stay (HOSPITAL_COMMUNITY): Payer: Medicare HMO

## 2018-06-06 ENCOUNTER — Inpatient Hospital Stay (HOSPITAL_COMMUNITY)
Admission: RE | Admit: 2018-06-06 | Discharge: 2018-06-14 | DRG: 236 | Disposition: A | Discharge status: Home / Self Care | Payer: Medicare HMO | Attending: Cardiothoracic Surgery | Admitting: Cardiothoracic Surgery

## 2018-06-06 DIAGNOSIS — E876 Hypokalemia: Secondary | ICD-10-CM | POA: Diagnosis present

## 2018-06-06 DIAGNOSIS — Z791 Long term (current) use of non-steroidal anti-inflammatories (NSAID): Secondary | ICD-10-CM | POA: Diagnosis not present

## 2018-06-06 DIAGNOSIS — Z79899 Other long term (current) drug therapy: Secondary | ICD-10-CM

## 2018-06-06 DIAGNOSIS — I10 Essential (primary) hypertension: Secondary | ICD-10-CM | POA: Diagnosis present

## 2018-06-06 DIAGNOSIS — I4891 Unspecified atrial fibrillation: Secondary | ICD-10-CM | POA: Diagnosis present

## 2018-06-06 DIAGNOSIS — Z95828 Presence of other vascular implants and grafts: Secondary | ICD-10-CM

## 2018-06-06 DIAGNOSIS — E119 Type 2 diabetes mellitus without complications: Secondary | ICD-10-CM | POA: Diagnosis present

## 2018-06-06 DIAGNOSIS — G4733 Obstructive sleep apnea (adult) (pediatric): Secondary | ICD-10-CM | POA: Diagnosis present

## 2018-06-06 DIAGNOSIS — K219 Gastro-esophageal reflux disease without esophagitis: Secondary | ICD-10-CM | POA: Diagnosis present

## 2018-06-06 DIAGNOSIS — Z87891 Personal history of nicotine dependence: Secondary | ICD-10-CM | POA: Diagnosis not present

## 2018-06-06 DIAGNOSIS — D62 Acute posthemorrhagic anemia: Secondary | ICD-10-CM | POA: Diagnosis not present

## 2018-06-06 DIAGNOSIS — I252 Old myocardial infarction: Secondary | ICD-10-CM

## 2018-06-06 DIAGNOSIS — Z955 Presence of coronary angioplasty implant and graft: Secondary | ICD-10-CM | POA: Diagnosis not present

## 2018-06-06 DIAGNOSIS — Z7982 Long term (current) use of aspirin: Secondary | ICD-10-CM | POA: Diagnosis not present

## 2018-06-06 DIAGNOSIS — Z6833 Body mass index (BMI) 33.0-33.9, adult: Secondary | ICD-10-CM

## 2018-06-06 DIAGNOSIS — E8770 Fluid overload, unspecified: Secondary | ICD-10-CM | POA: Diagnosis not present

## 2018-06-06 DIAGNOSIS — I25110 Atherosclerotic heart disease of native coronary artery with unstable angina pectoris: Secondary | ICD-10-CM | POA: Diagnosis present

## 2018-06-06 DIAGNOSIS — N179 Acute kidney failure, unspecified: Secondary | ICD-10-CM | POA: Diagnosis present

## 2018-06-06 DIAGNOSIS — Z7902 Long term (current) use of antithrombotics/antiplatelets: Secondary | ICD-10-CM

## 2018-06-06 DIAGNOSIS — E785 Hyperlipidemia, unspecified: Secondary | ICD-10-CM | POA: Diagnosis present

## 2018-06-06 DIAGNOSIS — D691 Qualitative platelet defects: Secondary | ICD-10-CM | POA: Diagnosis present

## 2018-06-06 DIAGNOSIS — J9811 Atelectasis: Secondary | ICD-10-CM | POA: Diagnosis not present

## 2018-06-06 DIAGNOSIS — Z9689 Presence of other specified functional implants: Secondary | ICD-10-CM

## 2018-06-06 DIAGNOSIS — N990 Postprocedural (acute) (chronic) kidney failure: Secondary | ICD-10-CM | POA: Diagnosis present

## 2018-06-06 DIAGNOSIS — I2510 Atherosclerotic heart disease of native coronary artery without angina pectoris: Secondary | ICD-10-CM | POA: Diagnosis present

## 2018-06-06 DIAGNOSIS — J811 Chronic pulmonary edema: Secondary | ICD-10-CM

## 2018-06-06 DIAGNOSIS — Z951 Presence of aortocoronary bypass graft: Secondary | ICD-10-CM

## 2018-06-06 HISTORY — PX: CORONARY ARTERY BYPASS GRAFT: SHX141

## 2018-06-06 HISTORY — PX: TEE WITHOUT CARDIOVERSION: SHX5443

## 2018-06-06 LAB — PLATELET COUNT: Platelets: 151 10*3/uL (ref 150–400)

## 2018-06-06 LAB — POCT I-STAT 3, ART BLOOD GAS (G3+)
Acid-base deficit: 2 mmol/L (ref 0.0–2.0)
Acid-base deficit: 3 mmol/L — ABNORMAL HIGH (ref 0.0–2.0)
Acid-base deficit: 5 mmol/L — ABNORMAL HIGH (ref 0.0–2.0)
Acid-base deficit: 3 mmol/L — ABNORMAL HIGH (ref 0.0–2.0)
Acid-base deficit: 4 mmol/L — ABNORMAL HIGH (ref 0.0–2.0)
Acid-base deficit: 4 mmol/L — ABNORMAL HIGH (ref 0.0–2.0)
Bicarbonate: 23.2 mmol/L (ref 20.0–28.0)
Bicarbonate: 21.8 mmol/L (ref 20.0–28.0)
Bicarbonate: 21.7 mmol/L (ref 20.0–28.0)
Bicarbonate: 22.2 mmol/L (ref 20.0–28.0)
Bicarbonate: 21.3 mmol/L (ref 20.0–28.0)
Bicarbonate: 21.3 mmol/L (ref 20.0–28.0)
O2 Saturation: 100 %
O2 Saturation: 96 %
O2 Saturation: 97 %
O2 Saturation: 98 %
O2 Saturation: 93 %
O2 Saturation: 91 %
Patient temperature: 35.4
Patient temperature: 35.9
Patient temperature: 37.1
Patient temperature: 37.5
TCO2: 24 mmol/L (ref 22–32)
TCO2: 23 mmol/L (ref 22–32)
TCO2: 23 mmol/L (ref 22–32)
TCO2: 23 mmol/L (ref 22–32)
TCO2: 22 mmol/L (ref 22–32)
TCO2: 22 mmol/L (ref 22–32)
pCO2 arterial: 42.8 mmHg (ref 32.0–48.0)
pCO2 arterial: 39.5 mmHg (ref 32.0–48.0)
pCO2 arterial: 43.1 mmHg (ref 32.0–48.0)
pCO2 arterial: 39 mmHg (ref 32.0–48.0)
pCO2 arterial: 37.9 mmHg (ref 32.0–48.0)
pCO2 arterial: 39.3 mmHg (ref 32.0–48.0)
pH, Arterial: 7.342 — ABNORMAL LOW (ref 7.350–7.450)
pH, Arterial: 7.351 (ref 7.350–7.450)
pH, Arterial: 7.301 — ABNORMAL LOW (ref 7.350–7.450)
pH, Arterial: 7.359 (ref 7.350–7.450)
pH, Arterial: 7.358 (ref 7.350–7.450)
pH, Arterial: 7.344 — ABNORMAL LOW (ref 7.350–7.450)
pO2, Arterial: 348 mmHg — ABNORMAL HIGH (ref 83.0–108.0)
pO2, Arterial: 88 mmHg (ref 83.0–108.0)
pO2, Arterial: 93 mmHg (ref 83.0–108.0)
pO2, Arterial: 106 mmHg (ref 83.0–108.0)
pO2, Arterial: 71 mmHg — ABNORMAL LOW (ref 83.0–108.0)
pO2, Arterial: 67 mmHg — ABNORMAL LOW (ref 83.0–108.0)

## 2018-06-06 LAB — POCT I-STAT, CHEM 8
BUN: 15 mg/dL (ref 8–23)
BUN: 11 mg/dL (ref 8–23)
BUN: 14 mg/dL (ref 8–23)
BUN: 12 mg/dL (ref 8–23)
BUN: 13 mg/dL (ref 8–23)
BUN: 12 mg/dL (ref 8–23)
Calcium, Ion: 1.27 mmol/L (ref 1.15–1.40)
Calcium, Ion: 1 mmol/L — ABNORMAL LOW (ref 1.15–1.40)
Calcium, Ion: 1.23 mmol/L (ref 1.15–1.40)
Calcium, Ion: 1.08 mmol/L — ABNORMAL LOW (ref 1.15–1.40)
Calcium, Ion: 1.28 mmol/L (ref 1.15–1.40)
Calcium, Ion: 1.19 mmol/L (ref 1.15–1.40)
Chloride: 104 mmol/L (ref 98–111)
Chloride: 102 mmol/L (ref 98–111)
Chloride: 105 mmol/L (ref 98–111)
Chloride: 101 mmol/L (ref 98–111)
Chloride: 104 mmol/L (ref 98–111)
Chloride: 105 mmol/L (ref 98–111)
Creatinine, Ser: 0.8 mg/dL (ref 0.61–1.24)
Creatinine, Ser: 0.7 mg/dL (ref 0.61–1.24)
Creatinine, Ser: 0.8 mg/dL (ref 0.61–1.24)
Creatinine, Ser: 0.7 mg/dL (ref 0.61–1.24)
Creatinine, Ser: 0.8 mg/dL (ref 0.61–1.24)
Creatinine, Ser: 0.8 mg/dL (ref 0.61–1.24)
Glucose, Bld: 122 mg/dL — ABNORMAL HIGH (ref 70–99)
Glucose, Bld: 101 mg/dL — ABNORMAL HIGH (ref 70–99)
Glucose, Bld: 111 mg/dL — ABNORMAL HIGH (ref 70–99)
Glucose, Bld: 128 mg/dL — ABNORMAL HIGH (ref 70–99)
Glucose, Bld: 138 mg/dL — ABNORMAL HIGH (ref 70–99)
Glucose, Bld: 165 mg/dL — ABNORMAL HIGH (ref 70–99)
HCT: 40 % (ref 39.0–52.0)
HCT: 28 % — ABNORMAL LOW (ref 39.0–52.0)
HCT: 35 % — ABNORMAL LOW (ref 39.0–52.0)
HCT: 31 % — ABNORMAL LOW (ref 39.0–52.0)
HCT: 32 % — ABNORMAL LOW (ref 39.0–52.0)
HCT: 34 % — ABNORMAL LOW (ref 39.0–52.0)
Hemoglobin: 13.6 g/dL (ref 13.0–17.0)
Hemoglobin: 11.9 g/dL — ABNORMAL LOW (ref 13.0–17.0)
Hemoglobin: 9.5 g/dL — ABNORMAL LOW (ref 13.0–17.0)
Hemoglobin: 10.5 g/dL — ABNORMAL LOW (ref 13.0–17.0)
Hemoglobin: 10.9 g/dL — ABNORMAL LOW (ref 13.0–17.0)
Hemoglobin: 11.6 g/dL — ABNORMAL LOW (ref 13.0–17.0)
Potassium: 4.4 mmol/L (ref 3.5–5.1)
Potassium: 4.5 mmol/L (ref 3.5–5.1)
Potassium: 4.7 mmol/L (ref 3.5–5.1)
Potassium: 5.1 mmol/L (ref 3.5–5.1)
Potassium: 4.3 mmol/L (ref 3.5–5.1)
Potassium: 3.9 mmol/L (ref 3.5–5.1)
Sodium: 141 mmol/L (ref 135–145)
Sodium: 140 mmol/L (ref 135–145)
Sodium: 141 mmol/L (ref 135–145)
Sodium: 136 mmol/L (ref 135–145)
Sodium: 139 mmol/L (ref 135–145)
Sodium: 139 mmol/L (ref 135–145)
TCO2: 27 mmol/L (ref 22–32)
TCO2: 23 mmol/L (ref 22–32)
TCO2: 25 mmol/L (ref 22–32)
TCO2: 25 mmol/L (ref 22–32)
TCO2: 21 mmol/L — ABNORMAL LOW (ref 22–32)
TCO2: 22 mmol/L (ref 22–32)

## 2018-06-06 LAB — CBC
HCT: 34.8 % — ABNORMAL LOW (ref 39.0–52.0)
HCT: 36.7 % — ABNORMAL LOW (ref 39.0–52.0)
Hemoglobin: 11.6 g/dL — ABNORMAL LOW (ref 13.0–17.0)
Hemoglobin: 12.3 g/dL — ABNORMAL LOW (ref 13.0–17.0)
MCH: 32.5 pg (ref 26.0–34.0)
MCH: 32.5 pg (ref 26.0–34.0)
MCHC: 33.3 g/dL (ref 30.0–36.0)
MCHC: 33.5 g/dL (ref 30.0–36.0)
MCV: 97.5 fL (ref 78.0–100.0)
MCV: 97.1 fL (ref 78.0–100.0)
Platelets: 120 10*3/uL — ABNORMAL LOW (ref 150–400)
Platelets: 139 10*3/uL — ABNORMAL LOW (ref 150–400)
RBC: 3.57 MIL/uL — ABNORMAL LOW (ref 4.22–5.81)
RBC: 3.78 MIL/uL — ABNORMAL LOW (ref 4.22–5.81)
RDW: 11.9 % (ref 11.5–15.5)
RDW: 11.9 % (ref 11.5–15.5)
WBC: 8.8 10*3/uL (ref 4.0–10.5)
WBC: 9.3 10*3/uL (ref 4.0–10.5)

## 2018-06-06 LAB — GLUCOSE, CAPILLARY
Glucose-Capillary: 157 mg/dL — ABNORMAL HIGH (ref 70–99)
Glucose-Capillary: 148 mg/dL — ABNORMAL HIGH (ref 70–99)
Glucose-Capillary: 152 mg/dL — ABNORMAL HIGH (ref 70–99)
Glucose-Capillary: 139 mg/dL — ABNORMAL HIGH (ref 70–99)
Glucose-Capillary: 149 mg/dL — ABNORMAL HIGH (ref 70–99)
Glucose-Capillary: 166 mg/dL — ABNORMAL HIGH (ref 70–99)
Glucose-Capillary: 134 mg/dL — ABNORMAL HIGH (ref 70–99)
Glucose-Capillary: 123 mg/dL — ABNORMAL HIGH (ref 70–99)

## 2018-06-06 LAB — CREATININE, SERUM
Creatinine, Ser: 1.04 mg/dL (ref 0.61–1.24)
GFR calc Af Amer: >60 mL/min (ref >60)
GFR calc non Af Amer: >60 mL/min (ref >60)

## 2018-06-06 LAB — POCT I-STAT 4, (NA,K, GLUC, HGB,HCT)
Glucose, Bld: 146 mg/dL — ABNORMAL HIGH (ref 70–99)
HCT: 33 % — ABNORMAL LOW (ref 39.0–52.0)
Hemoglobin: 11.2 g/dL — ABNORMAL LOW (ref 13.0–17.0)
Potassium: 4.8 mmol/L (ref 3.5–5.1)
Sodium: 141 mmol/L (ref 135–145)

## 2018-06-06 LAB — HEMOGLOBIN AND HEMATOCRIT, BLOOD
HCT: 33.3 % — ABNORMAL LOW (ref 39.0–52.0)
Hemoglobin: 11.2 g/dL — ABNORMAL LOW (ref 13.0–17.0)

## 2018-06-06 LAB — PROTIME-INR
INR: 1.21
Prothrombin Time: 15.2 seconds (ref 11.4–15.2)

## 2018-06-06 LAB — MAGNESIUM: Magnesium: 2.9 mg/dL — ABNORMAL HIGH (ref 1.7–2.4)

## 2018-06-06 LAB — APTT: aPTT: 28 seconds (ref 24–36)

## 2018-06-06 SURGERY — CORONARY ARTERY BYPASS GRAFTING (CABG)
Anesthesia: General | Site: Chest

## 2018-06-06 MED ORDER — CALCIUM CHLORIDE 10 % IV SOLN
INTRAVENOUS | Status: AC
  Filled 2018-06-06: qty 10

## 2018-06-06 MED ORDER — ACETAMINOPHEN 650 MG RE SUPP
650.0000 mg | Freq: Once | RECTAL | Status: AC
  Administered 2018-06-06: 650 mg via RECTAL

## 2018-06-06 MED ORDER — ACETAMINOPHEN 160 MG/5ML PO SOLN: 650.0000 mg | Freq: Once | ORAL | Status: AC

## 2018-06-06 MED ORDER — ASPIRIN EC 325 MG PO TBEC
325.0000 mg | DELAYED_RELEASE_TABLET | Freq: Every day | ORAL | Status: DC
  Administered 2018-06-07 – 2018-06-14 (×7): 325 mg via ORAL
  Filled 2018-06-06 (×8): qty 1

## 2018-06-06 MED ORDER — FENTANYL CITRATE (PF) 250 MCG/5ML IJ SOLN
INTRAMUSCULAR | Status: AC
  Filled 2018-06-06: qty 5

## 2018-06-06 MED ORDER — ONDANSETRON HCL 4 MG/2ML IJ SOLN
INTRAMUSCULAR | Status: AC
  Filled 2018-06-06: qty 2

## 2018-06-06 MED ORDER — MIDAZOLAM HCL 2 MG/2ML IJ SOLN
2.0000 mg | INTRAMUSCULAR | Status: DC | PRN
  Administered 2018-06-06: 2 mg via INTRAVENOUS
  Filled 2018-06-06: qty 2

## 2018-06-06 MED ORDER — CHLORHEXIDINE GLUCONATE 0.12 % MT SOLN
15.0000 mL | OROMUCOSAL | Status: AC
  Administered 2018-06-06: 15 mL via OROMUCOSAL

## 2018-06-06 MED ORDER — CALCIUM CHLORIDE 10 % IV SOLN
INTRAVENOUS | Status: DC | PRN
  Administered 2018-06-06: 200 mg via INTRAVENOUS

## 2018-06-06 MED ORDER — SODIUM CHLORIDE 0.9 % IV SOLN
INTRAVENOUS | Status: DC
  Filled 2018-06-06 (×2): qty 1

## 2018-06-06 MED ORDER — PROPOFOL 10 MG/ML IV BOLUS
INTRAVENOUS | Status: DC | PRN
  Administered 2018-06-06: 200 mg via INTRAVENOUS

## 2018-06-06 MED ORDER — PRAVASTATIN SODIUM 20 MG PO TABS
20.0000 mg | ORAL_TABLET | Freq: Every day | ORAL | Status: DC
  Administered 2018-06-07 – 2018-06-10 (×4): 20 mg via ORAL
  Filled 2018-06-06 (×4): qty 1

## 2018-06-06 MED ORDER — MORPHINE SULFATE (PF) 2 MG/ML IV SOLN
1.0000 mg | INTRAVENOUS | Status: DC | PRN
  Administered 2018-06-06: 2 mg via INTRAVENOUS
  Filled 2018-06-06: qty 1

## 2018-06-06 MED ORDER — HEPARIN SODIUM (PORCINE) 1000 UNIT/ML IJ SOLN
INTRAMUSCULAR | Status: AC
  Filled 2018-06-06: qty 1

## 2018-06-06 MED ORDER — SODIUM CHLORIDE 0.9 % IV SOLN
INTRAVENOUS | Status: DC | PRN
  Administered 2018-06-06: 25 ug/min via INTRAVENOUS

## 2018-06-06 MED ORDER — SODIUM CHLORIDE 0.9 % IV SOLN
INTRAVENOUS | Status: DC
  Administered 2018-06-06: 14:00:00 via INTRAVENOUS

## 2018-06-06 MED ORDER — METOPROLOL TARTRATE 25 MG/10 ML ORAL SUSPENSION: 12.5000 mg | Freq: Two times a day (BID) | ORAL | Status: DC

## 2018-06-06 MED ORDER — ACETAMINOPHEN 500 MG PO TABS
1000.0000 mg | ORAL_TABLET | Freq: Four times a day (QID) | ORAL | Status: AC
  Administered 2018-06-06 – 2018-06-11 (×15): 1000 mg via ORAL
  Filled 2018-06-06 (×16): qty 2

## 2018-06-06 MED ORDER — CHLORHEXIDINE GLUCONATE 4 % EX LIQD: 30.0000 mL | CUTANEOUS | Status: DC

## 2018-06-06 MED ORDER — POTASSIUM CHLORIDE 10 MEQ/50ML IV SOLN: 10.0000 meq | INTRAVENOUS | Status: AC

## 2018-06-06 MED ORDER — SODIUM CHLORIDE 0.9% FLUSH
10.0000 mL | Freq: Two times a day (BID) | INTRAVENOUS | Status: DC
  Administered 2018-06-06 – 2018-06-07 (×2): 10 mL
  Administered 2018-06-08: 17 mL
  Administered 2018-06-11 – 2018-06-13 (×4): 10 mL

## 2018-06-06 MED ORDER — ROCURONIUM BROMIDE 10 MG/ML (PF) SYRINGE
PREFILLED_SYRINGE | INTRAVENOUS | Status: AC
  Filled 2018-06-06: qty 10

## 2018-06-06 MED ORDER — LACTATED RINGERS IV SOLN
INTRAVENOUS | Status: DC | PRN
  Administered 2018-06-06: 07:00:00 via INTRAVENOUS

## 2018-06-06 MED ORDER — MAGNESIUM SULFATE 4 GM/100ML IV SOLN
4.0000 g | Freq: Once | INTRAVENOUS | Status: AC
  Administered 2018-06-06: 4 g via INTRAVENOUS
  Filled 2018-06-06: qty 100

## 2018-06-06 MED ORDER — DEXAMETHASONE SODIUM PHOSPHATE 10 MG/ML IJ SOLN
INTRAMUSCULAR | Status: AC
  Filled 2018-06-06: qty 1

## 2018-06-06 MED ORDER — SODIUM CHLORIDE 0.9% FLUSH: 10.0000 mL | INTRAVENOUS | Status: DC | PRN

## 2018-06-06 MED ORDER — METOPROLOL TARTRATE 12.5 MG HALF TABLET: 12.5000 mg | ORAL_TABLET | Freq: Once | ORAL | Status: DC

## 2018-06-06 MED ORDER — MIDAZOLAM HCL 10 MG/2ML IJ SOLN
INTRAMUSCULAR | Status: AC
  Filled 2018-06-06: qty 2

## 2018-06-06 MED ORDER — ONDANSETRON HCL 4 MG/2ML IJ SOLN
4.0000 mg | Freq: Four times a day (QID) | INTRAMUSCULAR | Status: DC | PRN
  Administered 2018-06-07 (×2): 4 mg via INTRAVENOUS
  Filled 2018-06-06 (×2): qty 2

## 2018-06-06 MED ORDER — BISACODYL 5 MG PO TBEC
10.0000 mg | DELAYED_RELEASE_TABLET | Freq: Every day | ORAL | Status: DC
  Administered 2018-06-07 – 2018-06-13 (×5): 10 mg via ORAL
  Filled 2018-06-06 (×7): qty 2

## 2018-06-06 MED ORDER — METOPROLOL TARTRATE 5 MG/5ML IV SOLN
2.5000 mg | INTRAVENOUS | Status: DC | PRN
  Administered 2018-06-07 (×2): 2.5 mg via INTRAVENOUS
  Filled 2018-06-06: qty 5

## 2018-06-06 MED ORDER — METOCLOPRAMIDE HCL 5 MG/ML IJ SOLN
10.0000 mg | Freq: Four times a day (QID) | INTRAMUSCULAR | Status: DC
  Administered 2018-06-06 – 2018-06-10 (×15): 10 mg via INTRAVENOUS
  Filled 2018-06-06 (×13): qty 2

## 2018-06-06 MED ORDER — PHENYLEPHRINE HCL 10 MG/ML IJ SOLN
0.0000 ug/min | INTRAMUSCULAR | Status: DC
  Administered 2018-06-06 (×2): 25 ug/min via INTRAVENOUS
  Filled 2018-06-06 (×2): qty 10

## 2018-06-06 MED ORDER — MIDAZOLAM HCL 5 MG/5ML IJ SOLN
INTRAMUSCULAR | Status: DC | PRN
  Administered 2018-06-06: 2 mg via INTRAVENOUS
  Administered 2018-06-06: 1 mg via INTRAVENOUS
  Administered 2018-06-06: 3 mg via INTRAVENOUS
  Administered 2018-06-06 (×2): 2 mg via INTRAVENOUS

## 2018-06-06 MED ORDER — CHLORHEXIDINE GLUCONATE 0.12 % MT SOLN
OROMUCOSAL | Status: AC
  Filled 2018-06-06: qty 15

## 2018-06-06 MED ORDER — CHLORHEXIDINE GLUCONATE CLOTH 2 % EX PADS
6.0000 | MEDICATED_PAD | Freq: Every day | CUTANEOUS | Status: DC
  Administered 2018-06-06 – 2018-06-13 (×5): 6 via TOPICAL

## 2018-06-06 MED ORDER — DEXAMETHASONE SODIUM PHOSPHATE 10 MG/ML IJ SOLN
INTRAMUSCULAR | Status: DC | PRN
  Administered 2018-06-06: 10 mg via INTRAVENOUS

## 2018-06-06 MED ORDER — ACETAMINOPHEN 160 MG/5ML PO SOLN: 1000.0000 mg | Freq: Four times a day (QID) | ORAL | Status: AC

## 2018-06-06 MED ORDER — LIDOCAINE 2% (20 MG/ML) 5 ML SYRINGE
INTRAMUSCULAR | Status: AC
  Filled 2018-06-06: qty 5

## 2018-06-06 MED ORDER — MORPHINE SULFATE (PF) 2 MG/ML IV SOLN
2.0000 mg | INTRAVENOUS | Status: DC | PRN
  Administered 2018-06-06 – 2018-06-07 (×4): 2 mg via INTRAVENOUS
  Administered 2018-06-07: 4 mg via INTRAVENOUS
  Administered 2018-06-07: 2 mg via INTRAVENOUS
  Administered 2018-06-07: 4 mg via INTRAVENOUS
  Administered 2018-06-07: 2 mg via INTRAVENOUS
  Administered 2018-06-07: 4 mg via INTRAVENOUS
  Administered 2018-06-08: 2 mg via INTRAVENOUS
  Administered 2018-06-08: 4 mg via INTRAVENOUS
  Administered 2018-06-09: 2 mg via INTRAVENOUS
  Filled 2018-06-06 (×2): qty 2
  Filled 2018-06-06 (×4): qty 1
  Filled 2018-06-06 (×3): qty 2
  Filled 2018-06-06 (×2): qty 1

## 2018-06-06 MED ORDER — DOCUSATE SODIUM 100 MG PO CAPS
200.0000 mg | ORAL_CAPSULE | Freq: Every day | ORAL | Status: DC
  Administered 2018-06-07 – 2018-06-14 (×7): 200 mg via ORAL
  Filled 2018-06-06 (×7): qty 2

## 2018-06-06 MED ORDER — DOPAMINE-DEXTROSE 3.2-5 MG/ML-% IV SOLN
2.5000 ug/kg/min | INTRAVENOUS | Status: DC
  Administered 2018-06-08 – 2018-06-10 (×2): 2.5 ug/kg/min via INTRAVENOUS
  Filled 2018-06-06 (×2): qty 250

## 2018-06-06 MED ORDER — CHLORHEXIDINE GLUCONATE 0.12% ORAL RINSE (MEDLINE KIT): 15.0000 mL | Freq: Two times a day (BID) | OROMUCOSAL | Status: DC

## 2018-06-06 MED ORDER — DEXMEDETOMIDINE HCL IN NACL 200 MCG/50ML IV SOLN
0.0000 ug/kg/h | INTRAVENOUS | Status: DC
  Administered 2018-06-06 (×2): 0.7 ug/kg/h via INTRAVENOUS
  Filled 2018-06-06 (×5): qty 50

## 2018-06-06 MED ORDER — FENTANYL CITRATE (PF) 250 MCG/5ML IJ SOLN
INTRAMUSCULAR | Status: AC
  Filled 2018-06-06: qty 20

## 2018-06-06 MED ORDER — ORAL CARE MOUTH RINSE
15.0000 mL | Freq: Two times a day (BID) | OROMUCOSAL | Status: DC
  Administered 2018-06-07 – 2018-06-14 (×8): 15 mL via OROMUCOSAL

## 2018-06-06 MED ORDER — SODIUM BICARBONATE 8.4 % IV SOLN
50.0000 meq | Freq: Once | INTRAVENOUS | Status: AC
  Administered 2018-06-06: 50 meq via INTRAVENOUS

## 2018-06-06 MED ORDER — LACTATED RINGERS IV SOLN: INTRAVENOUS | Status: DC

## 2018-06-06 MED ORDER — ALBUMIN HUMAN 5 % IV SOLN
250.0000 mL | INTRAVENOUS | Status: AC | PRN
  Administered 2018-06-06 (×2): 250 mL via INTRAVENOUS
  Administered 2018-06-06: 14:00:00 via INTRAVENOUS
  Administered 2018-06-07: 250 mL via INTRAVENOUS
  Filled 2018-06-06: qty 250

## 2018-06-06 MED ORDER — PANTOPRAZOLE SODIUM 40 MG PO TBEC
40.0000 mg | DELAYED_RELEASE_TABLET | Freq: Every day | ORAL | Status: DC
  Administered 2018-06-08 – 2018-06-14 (×7): 40 mg via ORAL
  Filled 2018-06-06 (×7): qty 1

## 2018-06-06 MED ORDER — FENTANYL CITRATE (PF) 250 MCG/5ML IJ SOLN
INTRAMUSCULAR | Status: DC | PRN
  Administered 2018-06-06: 250 ug via INTRAVENOUS
  Administered 2018-06-06: 100 ug via INTRAVENOUS
  Administered 2018-06-06 (×2): 250 ug via INTRAVENOUS
  Administered 2018-06-06: 100 ug via INTRAVENOUS
  Administered 2018-06-06: 250 ug via INTRAVENOUS
  Administered 2018-06-06: 50 ug via INTRAVENOUS
  Administered 2018-06-06: 150 ug via INTRAVENOUS
  Administered 2018-06-06: 100 ug via INTRAVENOUS

## 2018-06-06 MED ORDER — NITROGLYCERIN IN D5W 200-5 MCG/ML-% IV SOLN: 0.0000 ug/min | INTRAVENOUS | Status: DC

## 2018-06-06 MED ORDER — HEMOSTATIC AGENTS (NO CHARGE) OPTIME
TOPICAL | Status: DC | PRN
  Administered 2018-06-06 (×5): 1 via TOPICAL

## 2018-06-06 MED ORDER — VANCOMYCIN HCL IN DEXTROSE 1-5 GM/200ML-% IV SOLN
1000.0000 mg | Freq: Once | INTRAVENOUS | Status: AC
  Administered 2018-06-06: 1000 mg via INTRAVENOUS
  Filled 2018-06-06: qty 200

## 2018-06-06 MED ORDER — INSULIN REGULAR BOLUS VIA INFUSION
0.0000 [IU] | Freq: Three times a day (TID) | INTRAVENOUS | Status: DC
  Filled 2018-06-06: qty 10

## 2018-06-06 MED ORDER — HEPARIN SODIUM (PORCINE) 1000 UNIT/ML IJ SOLN
INTRAMUSCULAR | Status: DC | PRN
  Administered 2018-06-06 (×2): 2000 [IU] via INTRAVENOUS
  Administered 2018-06-06: 32000 [IU] via INTRAVENOUS

## 2018-06-06 MED ORDER — BISACODYL 10 MG RE SUPP: 10.0000 mg | Freq: Every day | RECTAL | Status: DC

## 2018-06-06 MED ORDER — PHENYLEPHRINE HCL-NACL 20-0.9 MG/250ML-% IV SOLN
0.0000 ug/min | INTRAVENOUS | Status: DC
  Filled 2018-06-06 (×2): qty 250

## 2018-06-06 MED ORDER — SODIUM CHLORIDE 0.9% FLUSH
3.0000 mL | Freq: Two times a day (BID) | INTRAVENOUS | Status: DC
  Administered 2018-06-07 – 2018-06-09 (×4): 3 mL via INTRAVENOUS

## 2018-06-06 MED ORDER — VECURONIUM BROMIDE 10 MG IV SOLR
INTRAVENOUS | Status: AC
  Filled 2018-06-06: qty 10

## 2018-06-06 MED ORDER — SODIUM CHLORIDE 0.45 % IV SOLN
INTRAVENOUS | Status: DC | PRN
  Administered 2018-06-06: 14:00:00 via INTRAVENOUS

## 2018-06-06 MED ORDER — ORAL CARE MOUTH RINSE
15.0000 mL | OROMUCOSAL | Status: DC
  Administered 2018-06-06 (×2): 15 mL via OROMUCOSAL

## 2018-06-06 MED ORDER — PROTAMINE SULFATE 10 MG/ML IV SOLN
INTRAVENOUS | Status: AC
  Filled 2018-06-06: qty 25

## 2018-06-06 MED ORDER — METOPROLOL TARTRATE 12.5 MG HALF TABLET
12.5000 mg | ORAL_TABLET | Freq: Two times a day (BID) | ORAL | Status: DC
  Administered 2018-06-07 – 2018-06-09 (×5): 12.5 mg via ORAL
  Filled 2018-06-06 (×5): qty 1

## 2018-06-06 MED ORDER — TRAMADOL HCL 50 MG PO TABS
50.0000 mg | ORAL_TABLET | ORAL | Status: DC | PRN
  Administered 2018-06-07 – 2018-06-09 (×9): 100 mg via ORAL
  Filled 2018-06-06 (×9): qty 2

## 2018-06-06 MED ORDER — ASPIRIN 81 MG PO CHEW
324.0000 mg | CHEWABLE_TABLET | Freq: Every day | ORAL | Status: DC
  Administered 2018-06-13: 324 mg
  Filled 2018-06-06 (×2): qty 4

## 2018-06-06 MED ORDER — SODIUM CHLORIDE 0.9 % IV SOLN: 250.0000 mL | INTRAVENOUS | Status: DC

## 2018-06-06 MED ORDER — CEFUROXIME SODIUM 750 MG IJ SOLR
INTRAMUSCULAR | Status: DC | PRN
  Administered 2018-06-06: 750 mg via INTRAVENOUS

## 2018-06-06 MED ORDER — ROCURONIUM BROMIDE 10 MG/ML (PF) SYRINGE
PREFILLED_SYRINGE | INTRAVENOUS | Status: DC | PRN
  Administered 2018-06-06: 100 mg via INTRAVENOUS
  Administered 2018-06-06 (×3): 50 mg via INTRAVENOUS

## 2018-06-06 MED ORDER — PROTAMINE SULFATE 10 MG/ML IV SOLN
INTRAVENOUS | Status: DC | PRN
  Administered 2018-06-06: 50 mg via INTRAVENOUS
  Administered 2018-06-06: 270 mg via INTRAVENOUS

## 2018-06-06 MED ORDER — CHLORHEXIDINE GLUCONATE 0.12 % MT SOLN
15.0000 mL | Freq: Once | OROMUCOSAL | Status: AC
  Administered 2018-06-06: 15 mL via OROMUCOSAL
  Filled 2018-06-06: qty 15

## 2018-06-06 MED ORDER — FAMOTIDINE IN NACL 20-0.9 MG/50ML-% IV SOLN
20.0000 mg | Freq: Two times a day (BID) | INTRAVENOUS | Status: DC
  Administered 2018-06-06: 20 mg via INTRAVENOUS

## 2018-06-06 MED ORDER — SODIUM CHLORIDE 0.9% FLUSH: 3.0000 mL | INTRAVENOUS | Status: DC | PRN

## 2018-06-06 MED ORDER — CEFUROXIME SODIUM 1.5 G IV SOLR
1.5000 g | Freq: Two times a day (BID) | INTRAVENOUS | Status: AC
  Administered 2018-06-06 – 2018-06-08 (×4): 1.5 g via INTRAVENOUS
  Filled 2018-06-06 (×4): qty 1.5

## 2018-06-06 MED ORDER — LACTATED RINGERS IV SOLN
INTRAVENOUS | Status: DC
  Administered 2018-06-07: 02:00:00 via INTRAVENOUS

## 2018-06-06 MED ORDER — 0.9 % SODIUM CHLORIDE (POUR BTL) OPTIME
TOPICAL | Status: DC | PRN
  Administered 2018-06-06: 5000 mL

## 2018-06-06 MED ORDER — SODIUM CHLORIDE 0.9 % IV SOLN
INTRAVENOUS | Status: DC | PRN
  Administered 2018-06-06: 13:00:00 via INTRAVENOUS

## 2018-06-06 MED ORDER — LACTATED RINGERS IV SOLN: 500.0000 mL | Freq: Once | INTRAVENOUS | Status: DC | PRN

## 2018-06-06 MED ORDER — HYDROCODONE-ACETAMINOPHEN 5-325 MG PO TABS
1.0000 | ORAL_TABLET | ORAL | Status: DC | PRN
  Administered 2018-06-07 (×4): 1 via ORAL
  Filled 2018-06-06 (×5): qty 1

## 2018-06-06 SURGICAL SUPPLY — 108 items
ADAPTER CARDIO PERF ANTE/RETRO (ADAPTER) ×4 IMPLANT
ADH SKN CLS APL DERMABOND .7 (GAUZE/BANDAGES/DRESSINGS) ×2
ADPR PRFSN 84XANTGRD RTRGD (ADAPTER) ×2
BAG DECANTER FOR FLEXI CONT (MISCELLANEOUS) ×4 IMPLANT
BANDAGE ACE 4X5 VEL STRL LF (GAUZE/BANDAGES/DRESSINGS) ×4 IMPLANT
BANDAGE ACE 6X5 VEL STRL LF (GAUZE/BANDAGES/DRESSINGS) ×4 IMPLANT
BASKET HEART  (ORDER IN 25'S) (MISCELLANEOUS) ×1
BASKET HEART (ORDER IN 25'S) (MISCELLANEOUS) ×1
BASKET HEART (ORDER IN 25S) (MISCELLANEOUS) ×2 IMPLANT
BLADE CLIPPER SURG (BLADE) IMPLANT
BLADE STERNUM SYSTEM 6 (BLADE) ×4 IMPLANT
BLADE SURG 11 STRL SS (BLADE) ×2 IMPLANT
BLADE SURG 12 STRL SS (BLADE) ×4 IMPLANT
BNDG GAUZE ELAST 4 BULKY (GAUZE/BANDAGES/DRESSINGS) ×4 IMPLANT
CANISTER SUCT 3000ML PPV (MISCELLANEOUS) ×4 IMPLANT
CANNULA ARTERIAL NVNT 3/8 22FR (MISCELLANEOUS) ×2 IMPLANT
CANNULA GUNDRY RCSP 15FR (MISCELLANEOUS) ×4 IMPLANT
CATH CPB KIT VANTRIGT (MISCELLANEOUS) ×4 IMPLANT
CATH ROBINSON RED A/P 18FR (CATHETERS) ×12 IMPLANT
CATH THORACIC 36FR RT ANG (CATHETERS) ×4 IMPLANT
CLIP RETRACTION 3.0MM CORONARY (MISCELLANEOUS) ×2 IMPLANT
CLIP VESOCCLUDE SM WIDE 24/CT (CLIP) ×2 IMPLANT
CRADLE DONUT ADULT HEAD (MISCELLANEOUS) ×4 IMPLANT
DERMABOND ADVANCED (GAUZE/BANDAGES/DRESSINGS) ×2
DERMABOND ADVANCED .7 DNX12 (GAUZE/BANDAGES/DRESSINGS) IMPLANT
DRAIN CHANNEL 32F RND 10.7 FF (WOUND CARE) ×4 IMPLANT
DRAPE CARDIOVASCULAR INCISE (DRAPES) ×4
DRAPE SLUSH/WARMER DISC (DRAPES) ×4 IMPLANT
DRAPE SRG 135X102X78XABS (DRAPES) ×2 IMPLANT
DRSG AQUACEL AG ADV 3.5X14 (GAUZE/BANDAGES/DRESSINGS) ×4 IMPLANT
ELECT BLADE 4.0 EZ CLEAN MEGAD (MISCELLANEOUS) ×4
ELECT BLADE 6.5 EXT (BLADE) ×4 IMPLANT
ELECT CAUTERY BLADE 6.4 (BLADE) ×4 IMPLANT
ELECT REM PT RETURN 9FT ADLT (ELECTROSURGICAL) ×8
ELECTRODE BLDE 4.0 EZ CLN MEGD (MISCELLANEOUS) ×2 IMPLANT
ELECTRODE REM PT RTRN 9FT ADLT (ELECTROSURGICAL) ×4 IMPLANT
FELT TEFLON 1X6 (MISCELLANEOUS) ×6 IMPLANT
FLOSEAL 5ML (HEMOSTASIS) ×2 IMPLANT
GAUZE SPONGE 4X4 12PLY STRL (GAUZE/BANDAGES/DRESSINGS) ×6 IMPLANT
GAUZE SPONGE 4X4 12PLY STRL LF (GAUZE/BANDAGES/DRESSINGS) ×2 IMPLANT
GLOVE BIO SURGEON STRL SZ 6 (GLOVE) ×6 IMPLANT
GLOVE BIO SURGEON STRL SZ 6.5 (GLOVE) ×6 IMPLANT
GLOVE BIO SURGEON STRL SZ7.5 (GLOVE) ×12 IMPLANT
GLOVE BIO SURGEON STRL SZ8 (GLOVE) ×2 IMPLANT
GLOVE BIO SURGEON STRL SZ8.5 (GLOVE) ×2 IMPLANT
GLOVE BIO SURGEONS STRL SZ 6.5 (GLOVE) ×6
GLOVE BIOGEL PI IND STRL 6.5 (GLOVE) IMPLANT
GLOVE BIOGEL PI INDICATOR 6.5 (GLOVE) ×4
GOWN STRL REUS W/ TWL LRG LVL3 (GOWN DISPOSABLE) ×8 IMPLANT
GOWN STRL REUS W/TWL LRG LVL3 (GOWN DISPOSABLE) ×28
HEMOSTAT POWDER SURGIFOAM 1G (HEMOSTASIS) ×6 IMPLANT
HEMOSTAT SURGICEL 2X14 (HEMOSTASIS) ×4 IMPLANT
INSERT FOGARTY XLG (MISCELLANEOUS) IMPLANT
KIT BASIN OR (CUSTOM PROCEDURE TRAY) ×4 IMPLANT
KIT SUCTION CATH 14FR (SUCTIONS) ×4 IMPLANT
KIT TURNOVER KIT B (KITS) ×4 IMPLANT
KIT VASOVIEW HEMOPRO VH 3000 (KITS) ×4 IMPLANT
LEAD PACING MYOCARDI (MISCELLANEOUS) ×4 IMPLANT
MARKER GRAFT CORONARY BYPASS (MISCELLANEOUS) ×12 IMPLANT
NS IRRIG 1000ML POUR BTL (IV SOLUTION) ×20 IMPLANT
PACK E OPEN HEART (SUTURE) ×4 IMPLANT
PACK OPEN HEART (CUSTOM PROCEDURE TRAY) ×4 IMPLANT
PAD ARMBOARD 7.5X6 YLW CONV (MISCELLANEOUS) ×8 IMPLANT
PAD ELECT DEFIB RADIOL ZOLL (MISCELLANEOUS) ×4 IMPLANT
PENCIL BUTTON HOLSTER BLD 10FT (ELECTRODE) ×4 IMPLANT
POWDER SURGICEL 3.0 GRAM (HEMOSTASIS) ×6 IMPLANT
PUNCH AORTIC ROTATE  4.5MM 8IN (MISCELLANEOUS) ×2 IMPLANT
PUNCH AORTIC ROTATE 4.0MM (MISCELLANEOUS) IMPLANT
PUNCH AORTIC ROTATE 4.5MM 8IN (MISCELLANEOUS) IMPLANT
PUNCH AORTIC ROTATE 5MM 8IN (MISCELLANEOUS) IMPLANT
SET CARDIOPLEGIA MPS 5001102 (MISCELLANEOUS) ×2 IMPLANT
SPONGE LAP 18X18 X RAY DECT (DISPOSABLE) ×8 IMPLANT
SPONGE LAP 4X18 RFD (DISPOSABLE) ×2 IMPLANT
SURGIFLO W/THROMBIN 8M KIT (HEMOSTASIS) ×2 IMPLANT
SUT BONE WAX W31G (SUTURE) ×4 IMPLANT
SUT MNCRL AB 4-0 PS2 18 (SUTURE) ×2 IMPLANT
SUT PROLENE 3 0 SH DA (SUTURE) IMPLANT
SUT PROLENE 3 0 SH1 36 (SUTURE) IMPLANT
SUT PROLENE 4 0 RB 1 (SUTURE) ×16
SUT PROLENE 4 0 SH DA (SUTURE) ×6 IMPLANT
SUT PROLENE 4-0 RB1 .5 CRCL 36 (SUTURE) ×2 IMPLANT
SUT PROLENE 5 0 C 1 36 (SUTURE) IMPLANT
SUT PROLENE 6 0 C 1 30 (SUTURE) ×2 IMPLANT
SUT PROLENE 6 0 CC (SUTURE) ×12 IMPLANT
SUT PROLENE 8 0 BV175 6 (SUTURE) ×4 IMPLANT
SUT PROLENE BLUE 7 0 (SUTURE) ×4 IMPLANT
SUT PROLENE POLY MONO (SUTURE) ×6 IMPLANT
SUT SILK  1 MH (SUTURE)
SUT SILK 1 MH (SUTURE) IMPLANT
SUT SILK 2 0 SH CR/8 (SUTURE) ×4 IMPLANT
SUT SILK 3 0 SH CR/8 (SUTURE) ×2 IMPLANT
SUT STEEL 6MS V (SUTURE) ×6 IMPLANT
SUT STEEL SZ 6 DBL 3X14 BALL (SUTURE) ×6 IMPLANT
SUT VIC AB 1 CTX 36 (SUTURE) ×16
SUT VIC AB 1 CTX36XBRD ANBCTR (SUTURE) ×4 IMPLANT
SUT VIC AB 2-0 CT1 27 (SUTURE) ×4
SUT VIC AB 2-0 CT1 TAPERPNT 27 (SUTURE) IMPLANT
SUT VIC AB 2-0 CTX 27 (SUTURE) IMPLANT
SUT VIC AB 3-0 X1 27 (SUTURE) IMPLANT
SYSTEM SAHARA CHEST DRAIN ATS (WOUND CARE) ×4 IMPLANT
TAPE CLOTH SURG 4X10 WHT LF (GAUZE/BANDAGES/DRESSINGS) ×4 IMPLANT
TAPE PAPER 3X10 WHT MICROPORE (GAUZE/BANDAGES/DRESSINGS) ×2 IMPLANT
TOWEL GREEN STERILE (TOWEL DISPOSABLE) ×4 IMPLANT
TOWEL GREEN STERILE FF (TOWEL DISPOSABLE) ×4 IMPLANT
TRAY FOLEY SLVR 16FR TEMP STAT (SET/KITS/TRAYS/PACK) ×4 IMPLANT
TUBING INSUFFLATION (TUBING) ×4 IMPLANT
UNDERPAD 30X30 (UNDERPADS AND DIAPERS) ×4 IMPLANT
WATER STERILE IRR 1000ML POUR (IV SOLUTION) ×8 IMPLANT

## 2018-06-06 NOTE — Brief Op Note (Signed)
06/06/2018  11:49 AM  PATIENT:  Allen James  77 y.o. male  PRE-OPERATIVE DIAGNOSIS:  CAD  POST-OPERATIVE DIAGNOSIS:  CAD  PROCEDURE:  TRANSESOPHAGEAL ECHOCARDIOGRAM (TEE), MEDIAN STERNOTOMY for CORONARY ARTERY BYPASS GRAFTING (CABG) x 3 (LIMA to LAD, SVG to DIAGONAL, SVG to OM) WITH ENDOSCOPIC HARVESTING OF RIGHT GREATER SAPHENOUS VEIN    SURGEON:  Surgeon(s) and Role:    Ivin Poot, MD - Primary  PHYSICIAN ASSISTANT: Lars Pinks PA-C  ASSISTANTS: Vernie Murders RNFA  ANESTHESIA:   general  EBL: Not documented  BLOOD ADMINISTERED:Two FFP  DRAINS: Chest tubes placed in the mediastinal and pleural spaces   COUNTS CORRECT:  YES  DICTATION: .Dragon Dictation  PLAN OF CARE: Admit to inpatient   PATIENT DISPOSITION:  ICU - intubated and hemodynamically stable.   Delay start of Pharmacological VTE agent (>24hrs) due to surgical blood loss or risk of bleeding: yes  BASELINE WEIGHT: 119.9 kg

## 2018-06-06 NOTE — Anesthesia Postprocedure Evaluation (Signed)
Anesthesia Post Note  Patient: DHANUSH JOKERST  Procedure(s) Performed: CORONARY ARTERY BYPASS GRAFTING (CABG) x 3 WITH ENDOSCOPIC HARVESTING OF RIGHT SAPHENOUS VEIN (N/A Chest) TRANSESOPHAGEAL ECHOCARDIOGRAM (TEE) (N/A )     Patient location during evaluation: SICU Anesthesia Type: General Level of consciousness: sedated Pain management: pain level controlled Vital Signs Assessment: post-procedure vital signs reviewed and stable Respiratory status: patient remains intubated per anesthesia plan Cardiovascular status: stable Postop Assessment: no apparent nausea or vomiting Anesthetic complications: no    Last Vitals:  Vitals:   06/06/18 0617 06/06/18 1408  BP: (!) 165/67 (!) 79/47  Pulse: 73 87  Resp: 20 12  Temp:    SpO2: 96% 95%    Last Pain:  Vitals:   06/06/18 0612  TempSrc: Oral  PainSc:                  Jaydrien Wassenaar DANIEL

## 2018-06-06 NOTE — Progress Notes (Signed)
  Echocardiogram Echocardiogram Transesophageal has been performed.  Allen James 06/06/2018, 9:59 AM

## 2018-06-06 NOTE — Progress Notes (Signed)
Patient received post op on charted vent settings.

## 2018-06-06 NOTE — Op Note (Signed)
NAME: Allen James, Allen James MEDICAL RECORD FB:[id number] ACCOUNT [id number] DATE OF BIRTH:[birth year] FACILITY: [facility] LOCATION: [facility] PHYSICIAN:Xylina Rhoads VAN TRIGT III, MD  OPERATIVE REPORT  DATE OF PROCEDURE:  06/06/2018  PROCEDURE PERFORMED: 1.  Coronary artery bypass grafting x3 (left internal mammary artery to left anterior descending, saphenous vein graft to diagonal, saphenous vein graft to circumflex marginal). 2.  Endoscopic harvest of right leg greater saphenous vein.  SURGEON:  Len Childs, MD  ASSISTANT:  Lars Pinks PA-C  ANESTHESIA:  General by Dr. Tamela Gammon  PREOPERATIVE DIAGNOSES:  Unstable angina, severe; recurrent multivessel coronary artery disease with history of previous percutaneous coronary intervention.  POSTOPERATIVE DIAGNOSES:  Unstable angina, severe; recurrent multivessel coronary artery disease with history of previous percutaneous coronary intervention.  CLINICAL NOTE:  The patient is a morbidly obese 77 year old male with history of previous PCI interventions to the LAD and RCA.  He developed recurrent angina similar to his prior symptoms.  He subsequently underwent cardiology evaluation and cardiac  catheterization.  This demonstrated recurrent high-grade stenosis of his LAD, diagonal and circumflex.  The PCI of the RCA was still widely patent.  His ventricular function was fairly well preserved.  He was recommended for surgical revascularization  after Plavix washout.  I saw the patient in consultation shortly after his cardiac catheterization and discussed the role of CABG for treatment of his severe CAD.  I discussed the procedure of CABG in detail, including the use of general anesthesia and  cardiopulmonary bypass, the location of the surgical incisions, and the expected postoperative hospital recovery.  I discussed with him the risks of the operation as well.  These included the risk of stroke, bleeding, blood transfusion, postoperative   pulmonary problems including pleural effusion, postoperative infection, postoperative organ failure, and death.  He demonstrated his understanding for the benefits and risks of surgery and agreed to proceed with surgery under what I felt was informed  consent.  OPERATIVE FINDINGS: 1.  Difficult exposure of the heart because of the patient's obese body habitus. 2.  Adequate vein conduit. 3.  Small mammary artery, but with adequate flow. 4.  Diffusely diseased coronaries under a thick layer of epicardial fat. 5.  No packed cell transfusions required for the surgery.  DESCRIPTION OF PROCEDURE:  The patient was brought from preop holding where informed consent was documented, and all of the patient's questions were addressed.  He was brought directly to the operating room and placed supine on the operating table where  general anesthesia was induced under invasive hemodynamic monitoring.  He remained stable.  A transesophageal echo probe was placed by anesthesia.  This demonstrated fairly well-preserved LV function.  There was mild aortic insufficiency, which did not  appear to be surgically significant to my assessment.  The patient was prepped and draped as a sterile field.  A proper time-out was performed.  A sternal incision was made as the saphenous vein was harvested endoscopically from the right leg.  The left  internal mammary artery was harvested as a pedicle graft from its origin at the subclavian vessels.  It was a 1.4 mm vessel with good flow.  The sternal retractor was placed using the deep blades because of the patient's obese body habitus.  The  pericardium was opened and suspended.  Pursestrings were placed in the ascending aorta and right atrium, and after the vein was harvested, heparin was administered.  When the ACT was therapeutic, the patient was cannulated and placed on cardiopulmonary  bypass.  The  coronaries were identified for grafting.  The previously placed right coronary  stent in the distal vessel appeared to be intact and no graft was needed.  The distal LAD, second diagonal, and circumflex marginal were found to be adequate  targets.  The mammary artery and vein grafts were prepared for the distal anastomoses, and cardioplegia cannulas were placed both antegrade and retrograde cold blood cardioplegia.  The patient was cooled to 32 degrees, and the aortic cross-clamp was  applied.  Cold blood cardioplegia 1.3 L was delivered in split doses between the antegrade aortic and retrograde coronary sinus catheters.  There was good cardioplegic arrest, and supple temperature dropped less than 14 degrees.  Cardioplegia was  delivered every 20 minutes.  The distal coronary anastomoses were performed.  The first distal anastomosis was to the circumflex marginal.  This was a heavily diseased 1.5 mm in diameter with a proximal 80% to 90% stenosis.  Reverse saphenous vein was sewn end-to-side with running  7-0 Prolene.  There was good flow through the graft.  Cardioplegia was redosed.  A second distal anastomosis was the diagonal branch of the LAD.  This was a 1.4 mm vessel, proximal 80% stenosis.  Reverse saphenous vein was sewn end-to-side with running 7-0 Prolene with good flow through the graft.  Cardioplegia was redosed.  The third distal anastomosis was the distal third LAD.  It was thickened and had some calcium.  It had a proximal 95% stenosis.  The left IMA pedicle was brought through an opening in the left lateral pericardium, which was covered in a thick layer of  fat.  It was brought down onto the LAD and sewn end-to-side with running 8-0 Prolene.  There was good flow through the anastomosis after briefly releasing the pedicle bulldog.  The bulldog was reapplied.  The pedicle secured to the epicardium.   Cardioplegia was redosed.  The cross-clamp still in place.  Two proximal vein anastomoses were performed on the ascending aorta using a 4.5 mm punch and running 6-0  Prolene.  Prior to tying down the final proximal anastomosis, air was vented from the coronaries with a dose of  retrograde warm blood cardioplegia.  The cross-clamp was removed.  The vein grafts were deaired and opened and each had good flow.  Hemostasis was documented at the proximal and distal sites.  The patient was rewarmed and reperfused.  Temporary pacing wires were applied.  The lungs were expanded.  The patient was placed  on low-dose renal dopamine.  The patient was then transitioned off cardiopulmonary bypass without difficulty.  Hemodynamics were stable.  Echo showed normal LV systolic function.  There is still minimal AI.  Protamine was administered without adverse  reaction, and the cannulas were removed.  The mediastinum was irrigated.  The superior pericardial fat was closed over the aorta.  The anterior mediastinal and left pleural chest tubes were placed and brought out through separate incisions.  The sternum  was closed with a wire.  The pectoralis fascia was closed with a running #1 Vicryl.  Subcutaneous and skin layers were closed with a running Vicryl and sterile dressings were applied.  Total cardiopulmonary bypass time was 120 minutes.  LN/NUANCE  D:06/06/2018 T:06/06/2018 JOB:[id number]

## 2018-06-06 NOTE — Progress Notes (Signed)
Patient ID: Allen James, male   DOB: [birth year], 77 y.o.   MRN: [id number] EVENING ROUNDS NOTE :     [address]       [place] [address]             [phone]                 Day of Surgery Procedure(s) (LRB): CORONARY ARTERY BYPASS GRAFTING (CABG) x 3 WITH ENDOSCOPIC HARVESTING OF RIGHT SAPHENOUS VEIN (N/A) TRANSESOPHAGEAL ECHOCARDIOGRAM (TEE) (N/A)  Total Length of Stay:  LOS: 0 days  BP (!) 93/57   Pulse 87   Temp 99.1 F (37.3 C)   Resp 20   SpO2 97%   .Intake/Output      07/11 0701 - 07/12 0700 07/12 0701 - 07/13 0700   I.V.  2756.3   Blood  1226   IV Piggyback  620.6   Total Intake  4602.9   Urine  3350   Blood  1000   Chest Tube  70   Total Output  4420   Net  +182.9          . sodium chloride 20 mL/hr at 06/06/18 1800  . [START ON 06/07/2018] sodium chloride    . sodium chloride 20 mL/hr at 06/06/18 1400  . albumin human 250 mL (06/06/18 1521)  . cefUROXime (ZINACEF)  IV    . dexmedetomidine (PRECEDEX) IV infusion 0.7 mcg/kg/hr (06/06/18 1849)  . DOPamine 2.5 mcg/kg/min (06/06/18 1800)  . famotidine (PEPCID) IV Stopped (06/06/18 1450)  . insulin (NOVOLIN-R) infusion 4.6 mL/hr at 06/06/18 1800  . lactated ringers    . lactated ringers 20 mL/hr at 06/06/18 1800  . magnesium sulfate 20 mL/hr at 06/06/18 1800  . nitroGLYCERIN Stopped (06/06/18 1400)  . phenylephrine 10mg /256mL NS (0.04 mg/mL) infusion 20 mcg/min (06/06/18 1800)  . vancomycin 1,000 mg (06/06/18 1853)     Lab Results  Component Value Date   WBC 8.8 06/06/2018   HGB 11.2 (L) 06/06/2018   HCT 33.0 (L) 06/06/2018   PLT 120 (L) 06/06/2018   GLUCOSE 146 (H) 06/06/2018   CHOL 183 05/03/2016   TRIG 147 05/03/2016   HDL 39 (L) 05/03/2016   LDLCALC 115 05/03/2016   ALT 23 06/05/2018   AST 24 06/05/2018   NA 141 06/06/2018   K 4.8 06/06/2018   CL 104 06/06/2018   CREATININE 0.80 06/06/2018   BUN 13 06/06/2018   CO2 19 (L) 06/05/2018   INR 1.21 06/06/2018   HGBA1C  6.0 (H) 06/05/2018   cabg today sats good now, on neo and dopamine Not bleeding fio2 50 % now    [name] MD  Beeper [phone] Office [phone] 06/06/2018 6:57 PM

## 2018-06-06 NOTE — Addendum Note (Signed)
Addendum  created 06/06/18 1440 by Verdie Drown, CRNA   Intraprocedure Meds edited

## 2018-06-06 NOTE — Anesthesia Procedure Notes (Signed)
Central Venous Catheter Insertion Performed by: Duane Boston, MD, anesthesiologist Start/End7/10/2018 6:43 AM, 06/06/2018 6:53 AM Patient location: Pre-op. Preanesthetic checklist: patient identified, IV checked, site marked, risks and benefits discussed, surgical consent, monitors and equipment checked, pre-op evaluation, timeout performed and anesthesia consent Position: Trendelenburg Lidocaine 1% used for infiltration and patient sedated Hand hygiene performed , maximum sterile barriers used  and Seldinger technique used Catheter size: 8.5 Fr Total catheter length 8. PA cath was placed.Sheath introducer Swan type:thermodilution PA Cath depth:50 Procedure performed using ultrasound guided technique. Ultrasound Notes:anatomy identified, needle tip was noted to be adjacent to the nerve/plexus identified, no ultrasound evidence of intravascular and/or intraneural injection and image(s) printed for medical record Attempts: 1 Following insertion, line sutured and dressing applied. Post procedure assessment: free fluid flow, blood return through all ports and no air  Patient tolerated the procedure well with no immediate complications.

## 2018-06-06 NOTE — Procedures (Signed)
Extubation Procedure Note  Patient Details:   Name: Allen James DOB: [birth year] MRN: [id number]   Airway Documentation:  Airway 8 mm (Active)  Secured at (cm) 26 cm 06/06/2018  4:24 PM  Measured From Lips 06/06/2018  4:24 PM  Secured Location Right 06/06/2018  4:24 PM  Secured By Rana Snare Tape 06/06/2018  4:24 PM  Cuff Pressure (cm H2O) 28 cm H2O 06/06/2018  4:24 PM  Site Condition Dry 06/06/2018  4:24 PM   Vent end date: (not recorded) Vent end time: (not recorded)   Evaluation  O2 sats: stable throughout Complications: No apparent complications Patient did tolerate procedure well. Bilateral Breath Sounds: Clear   Yes   Patient extubated to [facility] without any complications. Patient has an adequate cough and is able to speak clearly. Patient stable at this time.  [name] 06/06/2018, 8:48 PM

## 2018-06-06 NOTE — H&P (Signed)
[address]       [place] [address]             [phone]                                        Allen James [place] Medical Record #[id number] Date of Birth: 1941/05/25   Referring: No ref. provider found Primary Care: Delilah Shan, MD Primary Cardiologist:Michael Burt Knack, MD   Chief Complaint: Unstable angina, exertional chest pain   History of Present Illness:     Patient examined, coronary angiogram images personally reviewed and counseled with patient, patient discussed with Dr. Burt Knack for coordination of care.   Very nice 78 year old nondiabetic male presents with recurrent angina after undergoing PCI in 2015 when he presented with acute myocardial infarction.  At that time the RCA was opened with several drug-eluting stents.  A stent was also placed in the circumflex.  He initially took Letcher now has been taking Plavix he has had significant bruising and bleeding problems with both antiplatelet agents.   Recently the patient has had increased terms of chest discomfort with exertion, now increasing in frequency and severity.  No nocturnal or resting symptoms.  No symptoms of CHF including PND or orthopnea.   Because of his recurrent angina patient underwent cardiac catheterization today via left radial artery.  He had persistent bleeding from the arterial stick site.  Findings the catheter demonstrated a ostial 90% LAD stenosis, moderate stenosis of the diagonal, and stenosis of the OM1 branch prior to a stent.  LVEDP was 20.  EF appears to be normal.  The RCA stents are patent with what appears to be insignificant disease in the right coronary artery.  The patient's cardiologist has recommended coronary bypass grafting as his best long-term therapy.  I agree with that recommendation and the patient will be scheduled for surgery on July 12 after a Plavix washout.   Patient has been healthy for extended period time.  His only previous operation was  appendectomy.  He is undergone outpatient carpal tunnel surgery and knee arthroscopy.  Patient has sleep apnea but is a non-smoker.  He is a nondiabetic and creatinine is.  He is not anemic.     Patient lives with his wife very active doing yard work and golf activities.   At the time of surgery this patient's most appropriate activity status/level should be described as: []     0    Normal activity, no symptoms [x]     1    Restricted in physical strenuous activity but ambulatory, able to do out light work []     2    Ambulatory and capable of self care, unable to do work activities, up and about                 more than 50%  Of the time                            []     3    Only limited self care, in bed greater than 50% of waking hours []     4    Completely disabled, no self care, confined to bed or chair []     5    Moribund       Past Medical History:  Diagnosis  Date  . Abnormal findings on cardiac catheterization      ! Do NOT use RIGHT radial access on future caths or PCI.  Marland Kitchen Acute non-ST elevation myocardial infarction (NSTEMI) ([place]) 04/12/2014  . Acute non-ST segment elevation myocardial infarction ([place]) 04/12/2014  . Adiposity 04/14/2014  . AKI (acute kidney injury) ([place]) 04/14/2014  . Arthritis    . Back pain    . Benign essential hypertension 12/20/2016  . CAD (coronary artery disease)      a. NSTEMI 04/12/14 s/p DES x 2 to RCA. Prox 3.5x38 mm Xience Alpine DES, Dist 3.5x28 mm Xience DES; EF 65-70%. Staged PCI to the OM1 with 2.75 x 12 mm Resolute DES     . Chronic coronary artery disease 04/14/2014  . Chronic lower back pain 07/13/2014  . Dyspepsia 03/07/2016  . GERD (gastroesophageal reflux disease)    . H/O hiatal hernia    . Hyperlipidemia    . Injury of kidney 04/14/2014  . Muscle soreness 04/27/2014  . NSTEMI (non-ST elevated myocardial infarction) ([place]) 04/12/2014  . Obesity 04/14/2014  . Obstructive apnea 12/28/2014  . OSA on CPAP    . Osteoarthrosis 09/30/2014  . Primary  insomnia 03/07/2016  . Pure hypercholesterolemia 12/28/2014  . SLEEP APNEA 02/24/2009    Qualifier: Diagnosis of  By: Annamaria Boots MD, Kasandra Knudsen            Past Surgical History:  Procedure Laterality Date  . APPENDECTOMY        Remote  . CARPAL TUNNEL RELEASE Bilateral    . CORONARY ANGIOPLASTY   04/12/14    STENT TO RCA  . KNEE ARTHROSCOPY Bilateral    . LEFT HEART CATHETERIZATION WITH CORONARY ANGIOGRAM N/A 04/12/2014    Procedure: LEFT HEART CATHETERIZATION WITH CORONARY ANGIOGRAM;  Surgeon: Blane Ohara, MD;  Location: [facility] CATH LAB;  Service: Cardiovascular;  Laterality: N/A;  . PERCUTANEOUS CORONARY STENT INTERVENTION (PCI-S)   04/12/2014    Procedure: PERCUTANEOUS CORONARY STENT INTERVENTION (PCI-S);  Surgeon: Blane Ohara, MD;  Location: [facility] CATH LAB;  Service: Cardiovascular;;  . PERCUTANEOUS CORONARY STENT INTERVENTION (PCI-S) N/A 04/14/2014    Procedure: PERCUTANEOUS CORONARY STENT INTERVENTION (PCI-S);  Surgeon: Lake Breeding M Martinique, MD;  Location: [facility] CATH LAB;  Service: Cardiovascular;  Laterality: N/A;      Social History        Tobacco Use  Smoking Status Former Smoker  . Last attempt to quit: 11/26/1982  . Years since quitting: 35.5  Smokeless Tobacco Never Used    Social History       Substance and Sexual Activity  Alcohol Use No        No Known Allergies   Current Facility-Administered Medications  Medication Dose Route Frequency Provider Last Rate Last Dose  . 0.9 %  sodium chloride infusion  250 mL Intravenous PRN Sherren Mocha, MD      . 0.9 %  sodium chloride infusion  250 mL Intravenous PRN Sherren Mocha, MD      . 0.9% sodium chloride infusion  1 mL/kg/hr Intravenous Continuous Sherren Mocha, MD 118.8 mL/hr at 05/30/18 0711 1 mL/kg/hr at 05/30/18 0711  . 0.9% sodium chloride infusion  1 mL/kg/hr Intravenous Continuous Sherren Mocha, MD      . acetaminophen (TYLENOL) tablet 650 mg  650 mg Oral Q4H PRN Sherren Mocha, MD      . aspirin chewable tablet 81  mg  81 mg Oral Brock Ra, Legrand Como, MD      . clopidogrel (PLAVIX) tablet  75 mg  75 mg Oral Brock Ra, Legrand Como, MD      . ondansetron [facility]) injection 4 mg  4 mg Intravenous Q6H PRN Sherren Mocha, MD      . sodium chloride flush (NS) 0.9 % injection 3 mL  3 mL Intravenous Q12H Sherren Mocha, MD      . sodium chloride flush (NS) 0.9 % injection 3 mL  3 mL Intravenous PRN Sherren Mocha, MD      . sodium chloride flush (NS) 0.9 % injection 3 mL  3 mL Intravenous Q12H Sherren Mocha, MD      . sodium chloride flush (NS) 0.9 % injection 3 mL  3 mL Intravenous PRN Sherren Mocha, MD                 Medications Prior to Admission  Medication Sig Dispense Refill Last Dose  . amLODipine (NORVASC) 5 MG tablet Take 1 tablet (5 mg total) by mouth daily. 30 tablet 6 05/29/2018 at Unknown time  . aspirin EC 81 MG tablet Take 81 mg by mouth daily.     05/30/2018 at 0500  . clopidogrel (PLAVIX) 75 MG tablet Take 1 tablet (75 mg total) by mouth daily. 90 tablet 3 05/30/2018 at 0500  . docusate sodium (COLACE) 250 MG capsule Take 250 mg by mouth daily as needed for constipation.      Past Month at Unknown time  . HYDROcodone-acetaminophen (NORCO/VICODIN) 5-325 MG per tablet Take 1 tablet by mouth 3 (three) times daily as needed for moderate pain.      05/30/2018 at 0200  . isosorbide mononitrate (IMDUR) 120 MG 24 hr tablet Take 1 tablet (120 mg total) by mouth daily. 90 tablet 3 05/29/2018 at Unknown time  . lovastatin (MEVACOR) 20 MG tablet Take 1 tablet (20 mg total) by mouth at bedtime. (Patient taking differently: Take 20 mg by mouth daily. ) 30 tablet 11 05/29/2018 at Unknown time  . meloxicam (MOBIC) 15 MG tablet Take 15 mg by mouth daily.   1 05/29/2018 at Unknown time  . metoprolol succinate (TOPROL-XL) 50 MG 24 hr tablet Take 1 tablet (50 mg total) by mouth daily. 30 tablet 6 05/29/2018 at Unknown time  . nitroGLYCERIN (NITROSTAT) 0.4 MG SL tablet Place 1 tablet (0.4 mg total) under the tongue every  5 (five) minutes as needed for chest pain. 25 tablet 5 Past Month at Unknown time  . pantoprazole (PROTONIX) 40 MG tablet Take 1 tablet (40 mg total) by mouth daily. 90 tablet 3 05/29/2018 at Unknown time  . traMADol (ULTRAM) 50 MG tablet Take 100 mg by mouth every 8 (eight) hours as needed for moderate pain.      05/30/2018 at 0200           Family History  Problem Relation Age of Onset  . Coronary artery disease Mother 72        Review of Systems:    ROS                 Cardiac Review of Systems: Y or  [    ]= no             Chest Pain [ y   ]         Resting SOB [   ]         Exertional SOB  [ y]    Orthopnea [  ]             Pedal Edema [   ]  Palpitations [  ]            Syncope  [  ]    Presyncope [   ]             General Review of Systems: [Y] = yes [  ]=no Constitional: recent weight change [  ]; anorexia [  ]; fatigue [  ]; nausea [  ]; night sweats [  ]; fever [  ]; or chills [  ]                                                               Dental: Last Dentist visit: One year             Eye : blurred vision [  ]; diplopia [   ]; vision changes [  ];  Amaurosis fugax[  ]; Resp: cough [  ];  wheezing[  ];  hemoptysis[  ]; shortness of breath[  ]; paroxysmal nocturnal dyspnea[  ]; dyspnea on exertion[y  ]; or orthopnea[  ];  GI:  gallstones[  ], vomiting[  ];  dysphagia[  ]; melena[  ];  hematochezia [  ]; heartburn[  ];   Hx of  Colonoscopy[  ]; GU: kidney stones [  ]; hematuria[  ];   dysuria [  ];  nocturia[  ];  history of     obstruction [  ]; urinary frequency [  ]             Skin: rash, swelling[  ];, hair loss[  ];  peripheral edema[  ];  or itching[  ]; Musculosketetal: myalgias[  ];  joint swelling[  ];  joint erythema[  ];  joint pain[ y ];  back pain[  ];             Heme/Lymph: bruising[  ];  bleeding[  ];  anemia[  ];  Neuro: TIA[  ];  headaches[  ];  stroke[  ];  vertigo[  ];  seizures[  ];   paresthesias[  ];  difficulty walking[  ];              Psych:depression[  ]; anxiety[  ];             Endocrine: diabetes[  ];  thyroid dysfunction[  ]; Right-hand-dominant No previous thoracic trauma or pneumothorax                                      Physical Exam: BP 110/78   Pulse 65   Temp 97.8 F (36.6 C) (Oral)   Resp (!) 21   Ht 6' (1.829 m)   Wt 260 lb (117.9 kg)   SpO2 97%   BMI 35.26 kg/m           Exam     General- alert and comfortable   HEENTnormocephalic, dentition in good repair    Neck- no JVD, no cervical adenopathy palpable, no carotid bruit   Lungs- clear without rales, wheezes   Cor- regular rate and rhythm, no murmur , gallop   Abdomen- soft, non-tender   Extremities - warm, non-tender, minimal edema.  Bleeding from the left wrist cath site  Neuro- oriented, appropriate, no focal weakness     Diagnostic Studies & Laboratory data:     Recent Radiology Findings:    Imaging Results (Last 48 hours)  No results found.     I have independently reviewed the above radiologic studies and discussed with the patient    Recent Lab Findings: Recent Labs       Lab Results  Component Value Date    WBC 6.7 05/19/2018    HGB 14.3 05/19/2018    HCT 43.7 05/19/2018    PLT 182 05/19/2018    GLUCOSE 88 05/19/2018    CHOL 183 05/03/2016    TRIG 147 05/03/2016    HDL 39 (L) 05/03/2016    LDLCALC 115 05/03/2016    ALT 19 05/03/2016    AST 17 05/03/2016    NA 142 05/19/2018    K 4.4 05/19/2018    CL 105 05/19/2018    CREATININE 1.09 05/19/2018    BUN 17 05/19/2018    CO2 24 05/19/2018    INR 1.00 04/14/2014            Assessment / Plan:   Unstable angina with recurrent severe CAD valley LAD diagonal and OM1. Preserved LV function Platelet dysfunction with clinical bleeding problems on Plavix   Patient would benefit from multivessel CABG with bypass grafts to the LAD diagonal and OM1.  Prior to surgery he will need Plavix washout.  I discussed the procedure of CABG in detail with patient and his  wife.  They understand the expected benefits, the potential risks of surgery, and the alternatives.  He will stop his Plavix now and will be scheduled for CABG on July 12 at [facility].  He will return for preoperative assessment by anesthesia, pre-CABG Dopplers, and chest x-ray.         @ME1 @ 05/30/2018 12:41 PM                      Electronically signed by Ivin Poot, MD at 05/30/2018 12:52 PM

## 2018-06-06 NOTE — Progress Notes (Signed)
Pre Procedure note for inpatients:   Allen James has been scheduled for Procedure(s): CORONARY ARTERY BYPASS GRAFTING (CABG) (N/A) TRANSESOPHAGEAL ECHOCARDIOGRAM (TEE) (N/A) today. The various methods of treatment have been discussed with the patient. After consideration of the risks, benefits and treatment options the patient has consented to the planned procedure.   The patient has been seen and labs reviewed. There are no changes in the patient's condition to prevent proceeding with the planned procedure today.  Recent labs:  Lab Results  Component Value Date   WBC 4.8 06/05/2018   HGB 14.0 06/05/2018   HCT 43.0 06/05/2018   PLT 159 06/05/2018   GLUCOSE 113 (H) 06/05/2018   CHOL 183 05/03/2016   TRIG 147 05/03/2016   HDL 39 (L) 05/03/2016   LDLCALC 115 05/03/2016   ALT 23 06/05/2018   AST 24 06/05/2018   NA 138 06/05/2018   K 4.5 06/05/2018   CL 108 06/05/2018   CREATININE 1.05 06/05/2018   BUN 16 06/05/2018   CO2 19 (L) 06/05/2018   INR 0.99 06/05/2018   HGBA1C 6.0 (H) 06/05/2018    Len Childs, MD 06/06/2018 7:11 AM

## 2018-06-06 NOTE — Anesthesia Procedure Notes (Signed)
Arterial Line Insertion Start/End7/10/2018 7:00 AM, 06/06/2018 7:10 AM Performed by: Verdie Drown, CRNA, CRNA  Patient location: Pre-op. Preanesthetic checklist: patient identified, IV checked, site marked, risks and benefits discussed, surgical consent, monitors and equipment checked, pre-op evaluation, timeout performed and anesthesia consent Lidocaine 1% used for infiltration radial was placed Catheter size: 20 G Hand hygiene performed , maximum sterile barriers used  and Seldinger technique used Allen's test indicative of satisfactory collateral circulation Attempts: 1 Procedure performed without using ultrasound guided technique. Following insertion, dressing applied and Biopatch. Patient tolerated the procedure well with no immediate complications.

## 2018-06-06 NOTE — Transfer of Care (Signed)
Immediate Anesthesia Transfer of Care Note  Patient: Allen James  Procedure(s) Performed: CORONARY ARTERY BYPASS GRAFTING (CABG) x 3 WITH ENDOSCOPIC HARVESTING OF RIGHT SAPHENOUS VEIN (N/A Chest) TRANSESOPHAGEAL ECHOCARDIOGRAM (TEE) (N/A )  Patient Location: SICU  Anesthesia Type:General  Level of Consciousness: sedated and Patient remains intubated per anesthesia plan  Airway & Oxygen Therapy: Patient remains intubated per anesthesia plan and Patient placed on Ventilator (see vital sign flow sheet for setting)  Post-op Assessment: Report given to RN and Post -op Vital signs reviewed and stable  Post vital signs: Reviewed and stable  Last Vitals:  Vitals Value Taken Time  BP 79/47 06/06/2018  2:08 PM  Temp    Pulse 83 06/06/2018  2:10 PM  Resp 12 06/06/2018  2:10 PM  SpO2 99 % 06/06/2018  2:10 PM  Vitals shown include unvalidated device data.  Last Pain:  Vitals:   06/06/18 0612  TempSrc: Oral  PainSc:          Complications: No apparent anesthesia complications

## 2018-06-06 NOTE — Progress Notes (Signed)
NIF -30, VC 1L

## 2018-06-07 ENCOUNTER — Other Ambulatory Visit: Payer: Self-pay

## 2018-06-07 ENCOUNTER — Inpatient Hospital Stay (HOSPITAL_COMMUNITY): Payer: Medicare HMO

## 2018-06-07 LAB — CBC
HCT: 34.6 % — ABNORMAL LOW (ref 39.0–52.0)
HCT: 32.8 % — ABNORMAL LOW (ref 39.0–52.0)
Hemoglobin: 11.6 g/dL — ABNORMAL LOW (ref 13.0–17.0)
Hemoglobin: 10.5 g/dL — ABNORMAL LOW (ref 13.0–17.0)
MCH: 32.2 pg (ref 26.0–34.0)
MCH: 32.1 pg (ref 26.0–34.0)
MCHC: 33.5 g/dL (ref 30.0–36.0)
MCHC: 32 g/dL (ref 30.0–36.0)
MCV: 96.1 fL (ref 78.0–100.0)
MCV: 100.3 fL — ABNORMAL HIGH (ref 78.0–100.0)
Platelets: 129 10*3/uL — ABNORMAL LOW (ref 150–400)
Platelets: 113 10*3/uL — ABNORMAL LOW (ref 150–400)
RBC: 3.6 MIL/uL — ABNORMAL LOW (ref 4.22–5.81)
RBC: 3.27 MIL/uL — ABNORMAL LOW (ref 4.22–5.81)
RDW: 12 % (ref 11.5–15.5)
RDW: 12.3 % (ref 11.5–15.5)
WBC: 11 10*3/uL — ABNORMAL HIGH (ref 4.0–10.5)
WBC: 10.9 10*3/uL — ABNORMAL HIGH (ref 4.0–10.5)

## 2018-06-07 LAB — BPAM FFP
Blood Product Expiration Date: 201907152359
Blood Product Expiration Date: 201907152359
ISSUE DATE / TIME: 201907121107
ISSUE DATE / TIME: 201907121107
Unit Type and Rh: 600
Unit Type and Rh: 6200

## 2018-06-07 LAB — CREATININE, SERUM
Creatinine, Ser: 0.93 mg/dL (ref 0.61–1.24)
GFR calc Af Amer: >60 mL/min (ref >60)
GFR calc non Af Amer: >60 mL/min (ref >60)

## 2018-06-07 LAB — BASIC METABOLIC PANEL
Anion gap: 10 (ref 5–15)
BUN: 16 mg/dL (ref 8–23)
CO2: 22 mmol/L (ref 22–32)
Calcium: 8.4 mg/dL — ABNORMAL LOW (ref 8.9–10.3)
Chloride: 106 mmol/L (ref 98–111)
Creatinine, Ser: 1.12 mg/dL (ref 0.61–1.24)
GFR calc Af Amer: >60 mL/min (ref >60)
GFR calc non Af Amer: >60 mL/min (ref >60)
Glucose, Bld: 114 mg/dL — ABNORMAL HIGH (ref 70–99)
Potassium: 4.1 mmol/L (ref 3.5–5.1)
Sodium: 138 mmol/L (ref 135–145)

## 2018-06-07 LAB — GLUCOSE, CAPILLARY
Glucose-Capillary: 101 mg/dL — ABNORMAL HIGH (ref 70–99)
Glucose-Capillary: 107 mg/dL — ABNORMAL HIGH (ref 70–99)
Glucose-Capillary: 108 mg/dL — ABNORMAL HIGH (ref 70–99)
Glucose-Capillary: 110 mg/dL — ABNORMAL HIGH (ref 70–99)
Glucose-Capillary: 110 mg/dL — ABNORMAL HIGH (ref 70–99)
Glucose-Capillary: 113 mg/dL — ABNORMAL HIGH (ref 70–99)
Glucose-Capillary: 116 mg/dL — ABNORMAL HIGH (ref 70–99)
Glucose-Capillary: 118 mg/dL — ABNORMAL HIGH (ref 70–99)
Glucose-Capillary: 118 mg/dL — ABNORMAL HIGH (ref 70–99)
Glucose-Capillary: 118 mg/dL — ABNORMAL HIGH (ref 70–99)
Glucose-Capillary: 122 mg/dL — ABNORMAL HIGH (ref 70–99)
Glucose-Capillary: 122 mg/dL — ABNORMAL HIGH (ref 70–99)
Glucose-Capillary: 124 mg/dL — ABNORMAL HIGH (ref 70–99)

## 2018-06-07 LAB — PREPARE FRESH FROZEN PLASMA
Unit division: 0
Unit division: 0

## 2018-06-07 LAB — MAGNESIUM
Magnesium: 2.6 mg/dL — ABNORMAL HIGH (ref 1.7–2.4)
Magnesium: 1.7 mg/dL (ref 1.7–2.4)

## 2018-06-07 LAB — POTASSIUM: Potassium: 2.7 mmol/L — CL (ref 3.5–5.1)

## 2018-06-07 MED ORDER — HYDROCODONE-ACETAMINOPHEN 5-325 MG PO TABS
1.0000 | ORAL_TABLET | ORAL | Status: DC | PRN
  Administered 2018-06-07 – 2018-06-08 (×4): 1 via ORAL
  Filled 2018-06-07 (×5): qty 1

## 2018-06-07 MED ORDER — INSULIN ASPART 100 UNIT/ML ~~LOC~~ SOLN
0.0000 [IU] | SUBCUTANEOUS | Status: DC
  Administered 2018-06-08 (×3): 2 [IU] via SUBCUTANEOUS

## 2018-06-07 MED ORDER — POTASSIUM CHLORIDE 10 MEQ/50ML IV SOLN
10.0000 meq | INTRAVENOUS | Status: AC | PRN
  Administered 2018-06-07 – 2018-06-08 (×3): 10 meq via INTRAVENOUS
  Filled 2018-06-07 (×4): qty 50

## 2018-06-07 MED ORDER — ENOXAPARIN SODIUM 30 MG/0.3ML ~~LOC~~ SOLN
30.0000 mg | Freq: Every day | SUBCUTANEOUS | Status: DC
  Administered 2018-06-07 – 2018-06-08 (×2): 30 mg via SUBCUTANEOUS
  Filled 2018-06-07 (×2): qty 0.3

## 2018-06-07 MED ORDER — SODIUM CHLORIDE 0.9 % IV SOLN
INTRAVENOUS | Status: DC | PRN
  Administered 2018-06-07: 22:00:00 via INTRAVENOUS

## 2018-06-07 MED ORDER — FUROSEMIDE 10 MG/ML IJ SOLN
40.0000 mg | Freq: Once | INTRAMUSCULAR | Status: AC
  Administered 2018-06-07: 40 mg via INTRAVENOUS
  Filled 2018-06-07: qty 4

## 2018-06-07 NOTE — Progress Notes (Signed)
Patient ID: Allen James, male   DOB: 03-Mar-1941, 77 y.o.   MRN: [id number] TCTS DAILY ICU PROGRESS NOTE                   [address]            , [id number]          [phone]   1 Day Post-Op Procedure(s) (LRB): CORONARY ARTERY BYPASS GRAFTING (CABG) x 3 WITH ENDOSCOPIC HARVESTING OF RIGHT SAPHENOUS VEIN (N/A) TRANSESOPHAGEAL ECHOCARDIOGRAM (TEE) (N/A)  Total Length of Stay:  LOS: 1 day   Subjective: Patient extubated awake alert, neurologically intact main complaint is back pain, notes he has "protruding disks:  Objective: Vital signs in last 24 hours: Temp:  [95.7 F (35.4 C)-99.9 F (37.7 C)] 99.1 F (37.3 C) (07/13 0715) Pulse Rate:  [74-92] 86 (07/13 0715) Cardiac Rhythm: Normal sinus rhythm (07/12 2230) Resp:  [12-33] 31 (07/13 0715) BP: (78-177)/(47-73) 112/56 (07/13 0710) SpO2:  [92 %-100 %] 93 % (07/13 0715) Arterial Line BP: (88-170)/(46-69) 111/51 (07/13 0715) FiO2 (%):  [40 %-80 %] 40 % (07/12 1938) Weight:  [270 lb 8.1 oz (122.7 kg)] 270 lb 8.1 oz (122.7 kg) (07/13 0600)  Filed Weights   06/07/18 0600  Weight: 270 lb 8.1 oz (122.7 kg)    Weight change:    Hemodynamic parameters for last 24 hours: PAP: (23-41)/(8-21) 39/18 CO:  [5.1 L/min-7.4 L/min] 5.9 L/min CI:  [2.2 L/min/m2-3.1 L/min/m2] 2.5 L/min/m2  Intake/Output from previous day: 07/12 0701 - 07/13 0700 In: 6239 [P.O.:120; I.V.:3687.7; Blood:1226; IV Piggyback:1205.3] Out: 4008 [Urine:4516; Emesis/NG output:200; Blood:1000; Chest Tube:350]  Intake/Output this shift: No intake/output data recorded.  Current Meds: Scheduled Meds: . acetaminophen  1,000 mg Oral Q6H   Or  . acetaminophen (TYLENOL) oral liquid 160 mg/5 mL  1,000 mg Per Tube Q6H  . aspirin EC  325 mg Oral Daily   Or  . aspirin  324 mg Per Tube Daily  . bisacodyl  10 mg Oral Daily   Or  . bisacodyl  10 mg Rectal Daily  . Chlorhexidine Gluconate Cloth  6 each Topical Daily  . docusate sodium   200 mg Oral Daily  . insulin regular  0-10 Units Intravenous TID WC  . mouth rinse  15 mL Mouth Rinse BID  . metoCLOPramide (REGLAN) injection  10 mg Intravenous Q6H  . metoprolol tartrate  12.5 mg Oral BID   Or  . metoprolol tartrate  12.5 mg Per Tube BID  . [START ON 06/08/2018] pantoprazole  40 mg Oral Daily  . pravastatin  20 mg Oral q1800  . sodium chloride flush  10-40 mL Intracatheter Q12H  . sodium chloride flush  3 mL Intravenous Q12H   Continuous Infusions: . sodium chloride 20 mL/hr at 06/07/18 0700  . sodium chloride    . sodium chloride 20 mL/hr at 06/06/18 1400  . cefUROXime (ZINACEF)  IV Stopped (06/06/18 2148)  . dexmedetomidine (PRECEDEX) IV infusion Stopped (06/06/18 1920)  . DOPamine 2.5 mcg/kg/min (06/07/18 0700)  . insulin (NOVOLIN-R) infusion 3.5 mL/hr at 06/07/18 0700  . lactated ringers    . lactated ringers 20 mL/hr at 06/07/18 0700  . nitroGLYCERIN Stopped (06/07/18 6761)  . phenylephrine 10mg /249mL NS (0.04 mg/mL) infusion Stopped (06/07/18 0629)   PRN Meds:.sodium chloride, HYDROcodone-acetaminophen, metoprolol tartrate, midazolam, morphine injection, ondansetron (ZOFRAN) IV, sodium chloride flush, sodium chloride flush, traMADol  General appearance: alert, cooperative and mild distress Neurologic: intact Heart: regular rate and  rhythm, S1, S2 normal, no murmur, click, rub or gallop Lungs: diminished breath sounds bibasilar Abdomen: soft, non-tender; bowel sounds normal; no masses,  no organomegaly Extremities: extremities normal, atraumatic, no cyanosis or edema and Homans sign is negative, no sign of DVT Wound: Sternum intact, 300 mL from tubes over past 12 hours  Lab Results: CBC: Recent Labs    06/06/18 2010 06/06/18 2011 06/07/18 0523  WBC 9.3  --  11.0*  HGB 12.3* 11.6* 11.6*  HCT 36.7* 34.0* 34.6*  PLT 139*  --  129*   BMET:  Recent Labs    06/05/18 0838  06/06/18 2011 06/07/18 0523  NA 138   < > 139 138  K 4.5   < > 3.9 4.1  CL  108   < > 105 106  CO2 19*  --   --  22  GLUCOSE 113*   < > 165* 114*  BUN 16   < > 12 16  CREATININE 1.05   < > 0.80 1.12  CALCIUM 8.9  --   --  8.4*   < > = values in this interval not displayed.    CMET: Lab Results  Component Value Date   WBC 11.0 (H) 06/07/2018   HGB 11.6 (L) 06/07/2018   HCT 34.6 (L) 06/07/2018   PLT 129 (L) 06/07/2018   GLUCOSE 114 (H) 06/07/2018   CHOL 183 05/03/2016   TRIG 147 05/03/2016   HDL 39 (L) 05/03/2016   LDLCALC 115 05/03/2016   ALT 23 06/05/2018   AST 24 06/05/2018   NA 138 06/07/2018   K 4.1 06/07/2018   CL 106 06/07/2018   CREATININE 1.12 06/07/2018   BUN 16 06/07/2018   CO2 22 06/07/2018   INR 1.21 06/06/2018   HGBA1C 6.0 (H) 06/05/2018      PT/INR:  Recent Labs    06/06/18 1400  LABPROT 15.2  INR 1.21   Radiology: Dg Chest Port 1 View  Result Date: 06/06/2018 CLINICAL DATA:  Status post CABG. EXAM: PORTABLE CHEST 1 VIEW COMPARISON:  June 05, 2018 FINDINGS: ETT is in good position. The NG tube terminates below today's film. A PA catheter terminates in the right pulmonary outflow tract. A left chest tube is identified. No pneumothorax. Opacity in left base is probably small effusion and atelectasis. No overt edema. Pulmonary venous congestion identified. IMPRESSION: 1. Support apparatus as above. 2. Left basilar opacity, probably a small effusion and atelectasis. Electronically Signed   By: Dorise Bullion III M.D   On: 06/06/2018 14:48     Assessment/Plan: S/P Procedure(s) (LRB): CORONARY ARTERY BYPASS GRAFTING (CABG) x 3 WITH ENDOSCOPIC HARVESTING OF RIGHT SAPHENOUS VEIN (N/A) TRANSESOPHAGEAL ECHOCARDIOGRAM (TEE) (N/A) Mobilize Diuresis Diabetes control See progression orders Expected Acute  Blood - loss Anemia- continue to monitor  Blood pressure has been labile,  On low-dose dopamine only now, blood pressures 140s to 150s DC tubes Continue dopamine until urine output improves   Allen James 06/07/2018 7:30  AM

## 2018-06-07 NOTE — Progress Notes (Signed)
Patient ID: Allen James, male   DOB: 1941-08-06, 77 y.o.   MRN: [id number] EVENING ROUNDS NOTE :     [address]       [place] [address]             [phone]                 1 Day Post-Op Procedure(s) (LRB): CORONARY ARTERY BYPASS GRAFTING (CABG) x 3 WITH ENDOSCOPIC HARVESTING OF RIGHT SAPHENOUS VEIN (N/A) TRANSESOPHAGEAL ECHOCARDIOGRAM (TEE) (N/A)  Total Length of Stay:  LOS: 1 day  BP 106/63   Pulse 85   Temp 97.8 F (36.6 C) (Oral)   Resp (!) 26   Wt 270 lb 8.1 oz (122.7 kg)   SpO2 93%   BMI 37.73 kg/m   .Intake/Output      07/13 0701 - 07/14 0700   P.O. 600   I.V. (mL/kg) 452.7 (3.7)   Blood    IV Piggyback 100   Total Intake(mL/kg) 1152.7 (9.4)   Urine (mL/kg/hr) 780 (0.5)   Emesis/NG output    Stool 300   Blood    Chest Tube 50   Total Output 1130   Net +22.7         . cefUROXime (ZINACEF)  IV 1.5 g (06/07/18 0757)  . DOPamine 2.5 mcg/kg/min (06/07/18 1200)  . lactated ringers    . lactated ringers 20 mL/hr at 06/07/18 1200  . nitroGLYCERIN Stopped (06/07/18 0900)  . phenylephrine 10mg /211mL NS (0.04 mg/mL) infusion 10 mcg/min (06/07/18 1400)     Lab Results  Component Value Date   WBC 10.9 (H) 06/07/2018   HGB 10.5 (L) 06/07/2018   HCT 32.8 (L) 06/07/2018   PLT 113 (L) 06/07/2018   GLUCOSE 114 (H) 06/07/2018   CHOL 183 05/03/2016   TRIG 147 05/03/2016   HDL 39 (L) 05/03/2016   LDLCALC 115 05/03/2016   ALT 23 06/05/2018   AST 24 06/05/2018   NA 138 06/07/2018   K 4.1 06/07/2018   CL 106 06/07/2018   CREATININE 0.93 06/07/2018   BUN 16 06/07/2018   CO2 22 06/07/2018   INR 1.21 06/06/2018   HGBA1C 6.0 (H) 06/05/2018   uop past 12 hours 780 uop Up to chair Cr stable this afternoon Avoid toradol with low uop kcl low-replace  [name] MD  Beeper [phone] Office [phone] 06/07/2018 7:46 PM

## 2018-06-07 NOTE — Progress Notes (Signed)
CRITICAL VALUE ALERT  Critical Value:  K+ 2.7  Date & Time Notied:  06/07/2018 1946  Provider Notified: Dr.Gerhardt  Orders Received/Actions taken: 3 runs of IV K+.

## 2018-06-07 NOTE — Plan of Care (Signed)
  Problem: Education: Goal: Knowledge of General Education information will improve Outcome: Progressing   Problem: Health Behavior/Discharge Planning: Goal: Ability to manage health-related needs will improve Outcome: Progressing   Problem: Clinical Measurements: Goal: Ability to maintain clinical measurements within normal limits will improve Outcome: Progressing Goal: Will remain free from infection Outcome: Progressing Goal: Diagnostic test results will improve Outcome: Progressing Goal: Respiratory complications will improve Outcome: Progressing Goal: Cardiovascular complication will be avoided Outcome: Progressing   Problem: Activity: Goal: Risk for activity intolerance will decrease Outcome: Progressing   Problem: Nutrition: Goal: Adequate nutrition will be maintained Outcome: Progressing   Problem: Coping: Goal: Level of anxiety will decrease Outcome: Progressing   Problem: Elimination: Goal: Will not experience complications related to bowel motility Outcome: Progressing Goal: Will not experience complications related to urinary retention Outcome: Progressing   Problem: Skin Integrity: Goal: Wound healing without signs and symptoms of infection Outcome: Progressing Goal: Risk for impaired skin integrity will decrease Outcome: Progressing   Problem: Respiratory: Goal: Respiratory status will improve Outcome: Progressing   Problem: Clinical Measurements: Goal: Postoperative complications will be avoided or minimized Outcome: Progressing   Problem: Urinary Elimination: Goal: Ability to achieve and maintain adequate renal perfusion and functioning will improve Outcome: Progressing

## 2018-06-07 NOTE — Plan of Care (Signed)
  Problem: Education: Goal: Knowledge of General Education information will improve Outcome: Progressing   Problem: Health Behavior/Discharge Planning: Goal: Ability to manage health-related needs will improve Outcome: Progressing   Problem: Clinical Measurements: Goal: Ability to maintain clinical measurements within normal limits will improve Outcome: Progressing Goal: Will remain free from infection Outcome: Progressing Goal: Diagnostic test results will improve Outcome: Progressing Goal: Respiratory complications will improve Outcome: Progressing Goal: Cardiovascular complication will be avoided Outcome: Progressing   Problem: Activity: Goal: Risk for activity intolerance will decrease Outcome: Progressing   Problem: Nutrition: Goal: Adequate nutrition will be maintained Outcome: Progressing   Problem: Coping: Goal: Level of anxiety will decrease Outcome: Progressing   Problem: Elimination: Goal: Will not experience complications related to bowel motility Outcome: Progressing Goal: Will not experience complications related to urinary retention Outcome: Progressing   Problem: Pain Managment: Goal: General experience of comfort will improve Outcome: Progressing   Problem: Safety: Goal: Ability to remain free from injury will improve Outcome: Progressing   Problem: Skin Integrity: Goal: Risk for impaired skin integrity will decrease Outcome: Progressing   Problem: Education: Goal: Ability to demonstrate proper wound care will improve Outcome: Progressing Goal: Knowledge of disease or condition will improve Outcome: Progressing Goal: Knowledge of the prescribed therapeutic regimen will improve Outcome: Progressing   Problem: Activity: Goal: Risk for activity intolerance will decrease Outcome: Progressing   Problem: Cardiac: Goal: Hemodynamic stability will improve Outcome: Progressing   Problem: Clinical Measurements: Goal: Postoperative  complications will be avoided or minimized Outcome: Progressing   Problem: Respiratory: Goal: Respiratory status will improve Outcome: Progressing   Problem: Skin Integrity: Goal: Wound healing without signs and symptoms of infection Outcome: Progressing Goal: Risk for impaired skin integrity will decrease Outcome: Progressing   Problem: Urinary Elimination: Goal: Ability to achieve and maintain adequate renal perfusion and functioning will improve Outcome: Progressing

## 2018-06-08 ENCOUNTER — Inpatient Hospital Stay (HOSPITAL_COMMUNITY): Payer: Medicare HMO

## 2018-06-08 LAB — HEPATIC FUNCTION PANEL
ALT: 21 U/L (ref 0–44)
AST: 40 U/L (ref 15–41)
Albumin: 3.5 g/dL (ref 3.5–5.0)
Alkaline Phosphatase: 63 U/L (ref 38–126)
Bilirubin, Direct: 0.5 mg/dL — ABNORMAL HIGH (ref 0.0–0.2)
Indirect Bilirubin: 0.9 mg/dL (ref 0.3–0.9)
Total Bilirubin: 1.4 mg/dL — ABNORMAL HIGH (ref 0.3–1.2)
Total Protein: 6.2 g/dL — ABNORMAL LOW (ref 6.5–8.1)

## 2018-06-08 LAB — CBC
HCT: 36.3 % — ABNORMAL LOW (ref 39.0–52.0)
HCT: 37.4 % — ABNORMAL LOW (ref 39.0–52.0)
Hemoglobin: 11.7 g/dL — ABNORMAL LOW (ref 13.0–17.0)
Hemoglobin: 11.8 g/dL — ABNORMAL LOW (ref 13.0–17.0)
MCH: 32.2 pg (ref 26.0–34.0)
MCH: 32.3 pg (ref 26.0–34.0)
MCHC: 32.2 g/dL (ref 30.0–36.0)
MCHC: 31.6 g/dL (ref 30.0–36.0)
MCV: 100 fL (ref 78.0–100.0)
MCV: 102.5 fL — ABNORMAL HIGH (ref 78.0–100.0)
Platelets: 125 10*3/uL — ABNORMAL LOW (ref 150–400)
Platelets: 95 10*3/uL — ABNORMAL LOW (ref 150–400)
RBC: 3.63 MIL/uL — ABNORMAL LOW (ref 4.22–5.81)
RBC: 3.65 MIL/uL — ABNORMAL LOW (ref 4.22–5.81)
RDW: 12.5 % (ref 11.5–15.5)
RDW: 12.6 % (ref 11.5–15.5)
WBC: 12.9 10*3/uL — ABNORMAL HIGH (ref 4.0–10.5)
WBC: 12.3 10*3/uL — ABNORMAL HIGH (ref 4.0–10.5)

## 2018-06-08 LAB — BASIC METABOLIC PANEL
Anion gap: 10 (ref 5–15)
Anion gap: 10 (ref 5–15)
BUN: 29 mg/dL — ABNORMAL HIGH (ref 8–23)
BUN: 38 mg/dL — ABNORMAL HIGH (ref 8–23)
CO2: 22 mmol/L (ref 22–32)
CO2: 21 mmol/L — ABNORMAL LOW (ref 22–32)
Calcium: 8.6 mg/dL — ABNORMAL LOW (ref 8.9–10.3)
Calcium: 8.3 mg/dL — ABNORMAL LOW (ref 8.9–10.3)
Chloride: 105 mmol/L (ref 98–111)
Chloride: 102 mmol/L (ref 98–111)
Creatinine, Ser: 1.85 mg/dL — ABNORMAL HIGH (ref 0.61–1.24)
Creatinine, Ser: 1.82 mg/dL — ABNORMAL HIGH (ref 0.61–1.24)
GFR calc Af Amer: 39 mL/min — ABNORMAL LOW (ref >60)
GFR calc Af Amer: 40 mL/min — ABNORMAL LOW (ref >60)
GFR calc non Af Amer: 34 mL/min — ABNORMAL LOW (ref >60)
GFR calc non Af Amer: 34 mL/min — ABNORMAL LOW (ref >60)
Glucose, Bld: 118 mg/dL — ABNORMAL HIGH (ref 70–99)
Glucose, Bld: 126 mg/dL — ABNORMAL HIGH (ref 70–99)
Potassium: 4.5 mmol/L (ref 3.5–5.1)
Potassium: 4.4 mmol/L (ref 3.5–5.1)
Sodium: 137 mmol/L (ref 135–145)
Sodium: 133 mmol/L — ABNORMAL LOW (ref 135–145)

## 2018-06-08 LAB — GLUCOSE, CAPILLARY
Glucose-Capillary: 119 mg/dL — ABNORMAL HIGH (ref 70–99)
Glucose-Capillary: 125 mg/dL — ABNORMAL HIGH (ref 70–99)
Glucose-Capillary: 123 mg/dL — ABNORMAL HIGH (ref 70–99)
Glucose-Capillary: 129 mg/dL — ABNORMAL HIGH (ref 70–99)
Glucose-Capillary: 99 mg/dL (ref 70–99)

## 2018-06-08 LAB — TROPONIN I
Troponin I: 1.2 ng/mL (ref <0.03)
Troponin I: 1.08 ng/mL (ref <0.03)

## 2018-06-08 LAB — TSH: TSH: 3.251 u[IU]/mL (ref 0.350–4.500)

## 2018-06-08 MED ORDER — FUROSEMIDE 10 MG/ML IJ SOLN
60.0000 mg | Freq: Two times a day (BID) | INTRAMUSCULAR | Status: AC
  Administered 2018-06-08 (×2): 60 mg via INTRAVENOUS
  Filled 2018-06-08 (×2): qty 6

## 2018-06-08 MED ORDER — AMIODARONE HCL IN DEXTROSE 360-4.14 MG/200ML-% IV SOLN
60.0000 mg/h | INTRAVENOUS | Status: AC
  Administered 2018-06-08 (×2): 60 mg/h via INTRAVENOUS

## 2018-06-08 MED ORDER — AMIODARONE HCL IN DEXTROSE 360-4.14 MG/200ML-% IV SOLN
30.0000 mg/h | INTRAVENOUS | Status: DC
  Administered 2018-06-08 – 2018-06-11 (×7): 30 mg/h via INTRAVENOUS
  Filled 2018-06-08 (×8): qty 200

## 2018-06-08 MED ORDER — AMIODARONE LOAD VIA INFUSION
150.0000 mg | Freq: Once | INTRAVENOUS | Status: AC
  Administered 2018-06-08: 150 mg via INTRAVENOUS
  Filled 2018-06-08: qty 83.34

## 2018-06-08 MED ORDER — AMIODARONE HCL IN DEXTROSE 360-4.14 MG/200ML-% IV SOLN
INTRAVENOUS | Status: AC
  Filled 2018-06-08: qty 200

## 2018-06-08 NOTE — Progress Notes (Signed)
Patient ID: Allen James, male   DOB: Mar 24, 1941, 77 y.o.   MRN: [id number] [facility] DAILY ICU PROGRESS NOTE                   [address]            [place] [address]          [phone]   2 Days Post-Op Procedure(s) (LRB): CORONARY ARTERY BYPASS GRAFTING (CABG) x 3 WITH ENDOSCOPIC HARVESTING OF RIGHT SAPHENOUS VEIN (N/A) TRANSESOPHAGEAL ECHOCARDIOGRAM (TEE) (N/A)  Total Length of Stay:  LOS: 2 days   Subjective: Patient notes that he feels somewhat better, up in chair, is requiring increasing FiO2 with increased nasal cannula flow  Objective: Vital signs in last 24 hours: Temp:  [97.7 F (36.5 C)-99.1 F (37.3 C)] 97.7 F (36.5 C) (07/14 0730) Pulse Rate:  [78-100] 90 (07/14 0730) Cardiac Rhythm: Normal sinus rhythm (07/13 2200) Resp:  [21-41] 27 (07/14 0300) BP: (65-157)/(49-98) 115/60 (07/14 0730) SpO2:  [85 %-99 %] 89 % (07/14 0730) Arterial Line BP: (73-160)/(37-65) 98/48 (07/13 1430) Weight:  [271 lb 13.2 oz (123.3 kg)] 271 lb 13.2 oz (123.3 kg) (07/14 0500)  Filed Weights   06/07/18 0600 06/08/18 0500  Weight: 270 lb 8.1 oz (122.7 kg) 271 lb 13.2 oz (123.3 kg)    Weight change: 1 lb 5.2 oz (0.6 kg)   Hemodynamic parameters for last 24 hours: PAP: (37-39)/(13-15) 37/13  Intake/Output from previous day: 07/13 0701 - 07/14 0700 In: 1817.8 [P.O.:600; I.V.:777; IV Piggyback:440.8] Out: 1439 [Urine:1089; Stool:300; Chest Tube:50]  Intake/Output this shift: No intake/output data recorded.  Current Meds: Scheduled Meds: . acetaminophen  1,000 mg Oral Q6H   Or  . acetaminophen (TYLENOL) oral liquid 160 mg/5 mL  1,000 mg Per Tube Q6H  . aspirin EC  325 mg Oral Daily   Or  . aspirin  324 mg Per Tube Daily  . bisacodyl  10 mg Oral Daily   Or  . bisacodyl  10 mg Rectal Daily  . Chlorhexidine Gluconate Cloth  6 each Topical Daily  . docusate sodium  200 mg Oral Daily  . enoxaparin (LOVENOX) injection  30 mg Subcutaneous QHS  . insulin aspart   0-24 Units Subcutaneous Q4H  . mouth rinse  15 mL Mouth Rinse BID  . metoCLOPramide (REGLAN) injection  10 mg Intravenous Q6H  . metoprolol tartrate  12.5 mg Oral BID   Or  . metoprolol tartrate  12.5 mg Per Tube BID  . pantoprazole  40 mg Oral Daily  . pravastatin  20 mg Oral q1800  . sodium chloride flush  10-40 mL Intracatheter Q12H  . sodium chloride flush  3 mL Intravenous Q12H   Continuous Infusions: . sodium chloride Stopped (06/07/18 2213)  . cefUROXime (ZINACEF)  IV 1.5 g (06/08/18 0747)  . DOPamine 2.5 mcg/kg/min (06/08/18 0740)  . lactated ringers    . lactated ringers 20 mL/hr at 06/08/18 0700  . nitroGLYCERIN Stopped (06/07/18 0900)  . phenylephrine 19m/250mL NS (0.04 mg/mL) infusion Stopped (06/07/18 2222)   PRN Meds:.sodium chloride, HYDROcodone-acetaminophen, metoprolol tartrate, morphine injection, ondansetron (ZOFRAN) IV, sodium chloride flush, sodium chloride flush, traMADol  General appearance: alert and cooperative Neurologic: intact Heart: irregularly irregular rhythm Lungs: diminished breath sounds bibasilar Abdomen: soft, non-tender; bowel sounds normal; no masses,  no organomegaly Extremities: extremities normal, atraumatic, no cyanosis or edema and Homans sign is negative, no sign of DVT Wound: Sternum intact dressing in place  Lab Results: CBC:  Recent Labs    06/07/18 1743 06/08/18 0233  WBC 10.9* 12.9*  HGB 10.5* 11.7*  HCT 32.8* 36.3*  PLT 113* 125*   BMET:  Recent Labs    06/07/18 0523 06/07/18 1743 06/08/18 0233  NA 138  --  137  K 4.1 2.7* 4.5  CL 106  --  105  CO2 22  --  22  GLUCOSE 114*  --  118*  BUN 16  --  29*  CREATININE 1.12 0.93 1.85*  CALCIUM 8.4*  --  8.6*    CMET: Lab Results  Component Value Date   WBC 12.9 (H) 06/08/2018   HGB 11.7 (L) 06/08/2018   HCT 36.3 (L) 06/08/2018   PLT 125 (L) 06/08/2018   GLUCOSE 118 (H) 06/08/2018   CHOL 183 05/03/2016   TRIG 147 05/03/2016   HDL 39 (L) 05/03/2016   LDLCALC  115 05/03/2016   ALT 23 06/05/2018   AST 24 06/05/2018   NA 137 06/08/2018   K 4.5 06/08/2018   CL 105 06/08/2018   CREATININE 1.85 (H) 06/08/2018   BUN 29 (H) 06/08/2018   CO2 22 06/08/2018   INR 1.21 06/06/2018   HGBA1C 6.0 (H) 06/05/2018      PT/INR:  Recent Labs    06/06/18 1400  LABPROT 15.2  INR 1.21   Radiology: No results found.   Acute Kidney Injury (any one)  Increase in SCr by > 0.3 within 48 hours  Increase SCr to > 1.5 times baseline  Urine volume < 0.5 ml/kg/h for 6 hrs  ?Stage 1 - Increase in serum creatinine to 1.5 to 1.9 times baseline, or increase in serum creatinine by ?0.3 mg/dL (?26.5 micromol/L), or reduction in urine output to <0.5 mL/kg per hour for 6 to 12 hours.  ?Stage 2 - Increase in serum creatinine to 2.0 to 2.9 times baseline, or reduction in urine output to <0.5 mL/kg per hour for ?12 hours.  ?Stage 3 - Increase in serum creatinine to 3.0 times baseline, or increase in serum creatinine to ?4.0 mg/dL (?353.6 micromol/L), or reduction in urine output to <0.3 mL/kg per hour for ?24 hours, or anuria for ?12 hours, or the initiation of renal replacement therapy, or, in patients <18 years, decrease in eGFR to <35 mL   Lab Results  Component Value Date   CREATININE 1.85 (H) 06/08/2018   Estimated Creatinine Clearance: 46.1 mL/min (A) (by C-G formula based on SCr of 1.85 mg/dL (H)).  Assessment/Plan: S/P Procedure(s) (LRB): CORONARY ARTERY BYPASS GRAFTING (CABG) x 3 WITH ENDOSCOPIC HARVESTING OF RIGHT SAPHENOUS VEIN (N/A) TRANSESOPHAGEAL ECHOCARDIOGRAM (TEE) (N/A) Mobilize Diuresis Diabetes control Now with stage I acute kidney injury with significant increase in creatinine to 1.85, no Toradol has been given Urine output 1089 for 24 hours, 285 for the past 12 hours Continue low-dose  Dopamine-increase Lasix Patient now with frequent PACs and bursts of A. Fib, will start IV amiodarone EKG reviewed-mild diffuse ST changes, will check  troponins  [name] 06/08/2018 8:04 AM

## 2018-06-08 NOTE — Progress Notes (Signed)
SpO2 readings noted with variance between ear lobe and probe on finger. Prior to extubation post op patient utilizing hands to attempt to communication leading to artifact and ineffective O2 sat monitoring. Pulse ox probe site changed to ear lobe at that time. This morning after ambulating in hall way, portable chest xray obtained, and probe on ear lobe fell off. New pulse ox probe replaced and site changed back to finger. SpO2 post ambulation with finger probe noted 85-86%. New probe replaced on ear to compare notes 91-93%. Finger probe pulse ox waveform with good wave. Oxygen via nasal cannula increased to 6L without effectiveness at increasing patients SpO2 with finger probe readings. Respiratory notified and updated. HiFlow nasal cannula set up and set at 10L for patient to maintain oxygen saturations greater than 90. Patient in no acute respiratory distress. Patient noted with baseline tachypnea at rest taking quick shallow breaths. Will review with MD during AM rounds.

## 2018-06-08 NOTE — Progress Notes (Signed)
Patient ID: CORBY VANDENBERGHE, male   DOB: 22-Oct-1941, 77 y.o.   MRN: [id number] EVENING ROUNDS NOTE :     [address]       [place] [address]             [phone]                 2 Days Post-Op Procedure(s) (LRB): CORONARY ARTERY BYPASS GRAFTING (CABG) x 3 WITH ENDOSCOPIC HARVESTING OF RIGHT SAPHENOUS VEIN (N/A) TRANSESOPHAGEAL ECHOCARDIOGRAM (TEE) (N/A)  Total Length of Stay:  LOS: 2 days  BP 132/61   Pulse 72   Temp 98.1 F (36.7 C) (Oral)   Resp (!) 27   Ht 6' (1.829 m) Comment: Stated.  Wt 271 lb 13.2 oz (123.3 kg)   SpO2 95%   BMI 36.87 kg/m   .Intake/Output      07/13 0701 - 07/14 0700 07/14 0701 - 07/15 0700   P.O. 960 480   I.V. (mL/kg) 777 (6.3) 603 (4.9)   Blood     IV Piggyback 440.8 99.9   Total Intake(mL/kg) 2177.8 (17.7) 1182.9 (9.6)   Urine (mL/kg/hr) 1089 (0.4) 625 (0.5)   Emesis/NG output     Stool 300    Blood     Chest Tube 50    Total Output 1439 625   Net +738.8 +557.9          . sodium chloride Stopped (06/07/18 2213)  . amiodarone 30 mg/hr (06/08/18 1650)  . DOPamine 2.5 mcg/kg/min (06/08/18 1600)  . lactated ringers    . lactated ringers 20 mL/hr at 06/08/18 1600  . nitroGLYCERIN Stopped (06/07/18 0900)  . phenylephrine 10mg /235mL NS (0.04 mg/mL) infusion Stopped (06/07/18 2222)     Lab Results  Component Value Date   WBC 12.9 (H) 06/08/2018   HGB 11.7 (L) 06/08/2018   HCT 36.3 (L) 06/08/2018   PLT 125 (L) 06/08/2018   GLUCOSE 126 (H) 06/08/2018   CHOL 183 05/03/2016   TRIG 147 05/03/2016   HDL 39 (L) 05/03/2016   LDLCALC 115 05/03/2016   ALT 21 06/08/2018   AST 40 06/08/2018   NA 133 (L) 06/08/2018   K 4.4 06/08/2018   CL 102 06/08/2018   CREATININE 1.82 (H) 06/08/2018   BUN 38 (H) 06/08/2018   CO2 21 (L) 06/08/2018   TSH 3.251 06/08/2018   INR 1.21 06/06/2018   HGBA1C 6.0 (H) 06/05/2018   Patient breathing better, walked around unit Foley still in to monitor uop Fu cr pending    [name] MD  Beeper [phone] Office [phone] 06/08/2018 5:31 PM

## 2018-06-08 NOTE — Progress Notes (Signed)
AM Rounding MD notified and updated regarding AM EKG results. Orders obtained for serial troponin lab work to be assessed and repeat EKG to be obtained tomorrow morning. Patient without new complaints of severe chest pain. Post op pain and chronic back pain managed with PRN meds as ordered. MD aware during evening rounds yesterday of patients complaints of increase back pain, pain medication orders adjusted by MD.

## 2018-06-08 NOTE — Progress Notes (Signed)
  Amiodarone Drug - Drug Interaction Consult Note  Recommendations: No changes recommended  Amiodarone is metabolized by the cytochrome P450 system and therefore has the potential to cause many drug interactions. Amiodarone has an average plasma half-life of 50 days (range 20 to 100 days).   There is potential for drug interactions to occur several weeks or months after stopping treatment and the onset of drug interactions may be slow after initiating amiodarone.   []  Statins: Increased risk of myopathy. Simvastatin- restrict dose to 20mg  daily. Other statins: counsel patients to report any muscle pain or weakness immediately.  [x]  Anticoagulants: Amiodarone can increase anticoagulant effect. Consider warfarin dose reduction. Patients should be monitored closely and the dose of anticoagulant altered accordingly, remembering that amiodarone levels take several weeks to stabilize.  []  Antiepileptics: Amiodarone can increase plasma concentration of phenytoin, the dose should be reduced. Note that small changes in phenytoin dose can result in large changes in levels. Monitor patient and counsel on signs of toxicity.  [x]  Beta blockers: increased risk of bradycardia, AV block and myocardial depression. Sotalol - avoid concomitant use.  []   Calcium channel blockers (diltiazem and verapamil): increased risk of bradycardia, AV block and myocardial depression.  []   Cyclosporine: Amiodarone increases levels of cyclosporine. Reduced dose of cyclosporine is recommended.  []  Digoxin dose should be halved when amiodarone is started.  []  Diuretics: increased risk of cardiotoxicity if hypokalemia occurs.  []  Oral hypoglycemic agents (glyburide, glipizide, glimepiride): increased risk of hypoglycemia. Patient's glucose levels should be monitored closely when initiating amiodarone therapy.   []  Drugs that prolong the QT interval:  Torsades de pointes risk may be increased with concurrent use - avoid if  possible.  Monitor QTc, also keep magnesium/potassium WNL if concurrent therapy can't be avoided. Marland Kitchen Antibiotics: e.g. fluoroquinolones, erythromycin. . Antiarrhythmics: e.g. quinidine, procainamide, disopyramide, sotalol. . Antipsychotics: e.g. phenothiazines, haloperidol.  . Lithium, tricyclic antidepressants, and methadone. Thank You,  Pat Patrick  06/08/2018 12:25 PM

## 2018-06-09 ENCOUNTER — Inpatient Hospital Stay (HOSPITAL_COMMUNITY): Payer: Medicare HMO

## 2018-06-09 ENCOUNTER — Encounter (HOSPITAL_COMMUNITY): Payer: Self-pay | Admitting: Cardiothoracic Surgery

## 2018-06-09 ENCOUNTER — Inpatient Hospital Stay: Payer: Self-pay

## 2018-06-09 LAB — POCT I-STAT 3, ART BLOOD GAS (G3+)
Acid-base deficit: 1 mmol/L (ref 0.0–2.0)
Bicarbonate: 24.4 mmol/L (ref 20.0–28.0)
O2 Saturation: 88 %
Patient temperature: 98.6
TCO2: 26 mmol/L (ref 22–32)
pCO2 arterial: 43.8 mmHg (ref 32.0–48.0)
pH, Arterial: 7.354 (ref 7.350–7.450)
pO2, Arterial: 58 mmHg — ABNORMAL LOW (ref 83.0–108.0)

## 2018-06-09 LAB — TYPE AND SCREEN
ABO/RH(D): O NEG
Antibody Screen: POSITIVE
PT AG Type: NEGATIVE
Unit division: 0
Unit division: 0
Unit division: 0
Unit division: 0

## 2018-06-09 LAB — GLUCOSE, CAPILLARY
Glucose-Capillary: 101 mg/dL — ABNORMAL HIGH (ref 70–99)
Glucose-Capillary: 108 mg/dL — ABNORMAL HIGH (ref 70–99)
Glucose-Capillary: 108 mg/dL — ABNORMAL HIGH (ref 70–99)
Glucose-Capillary: 109 mg/dL — ABNORMAL HIGH (ref 70–99)
Glucose-Capillary: 81 mg/dL (ref 70–99)
Glucose-Capillary: 91 mg/dL (ref 70–99)

## 2018-06-09 LAB — BASIC METABOLIC PANEL
Anion gap: 10 (ref 5–15)
BUN: 41 mg/dL — ABNORMAL HIGH (ref 8–23)
CO2: 23 mmol/L (ref 22–32)
Calcium: 8.3 mg/dL — ABNORMAL LOW (ref 8.9–10.3)
Chloride: 103 mmol/L (ref 98–111)
Creatinine, Ser: 1.56 mg/dL — ABNORMAL HIGH (ref 0.61–1.24)
GFR calc Af Amer: 48 mL/min — ABNORMAL LOW (ref >60)
GFR calc non Af Amer: 41 mL/min — ABNORMAL LOW (ref >60)
Glucose, Bld: 106 mg/dL — ABNORMAL HIGH (ref 70–99)
Potassium: 4.6 mmol/L (ref 3.5–5.1)
Sodium: 136 mmol/L (ref 135–145)

## 2018-06-09 LAB — POCT I-STAT, CHEM 8
BUN: 34 mg/dL — ABNORMAL HIGH (ref 8–23)
Calcium, Ion: 1.14 mmol/L — ABNORMAL LOW (ref 1.15–1.40)
Chloride: 92 mmol/L — ABNORMAL LOW (ref 98–111)
Creatinine, Ser: 1.1 mg/dL (ref 0.61–1.24)
Glucose, Bld: 148 mg/dL — ABNORMAL HIGH (ref 70–99)
HCT: 33 % — ABNORMAL LOW (ref 39.0–52.0)
Hemoglobin: 11.2 g/dL — ABNORMAL LOW (ref 13.0–17.0)
Potassium: 3.5 mmol/L (ref 3.5–5.1)
Sodium: 132 mmol/L — ABNORMAL LOW (ref 135–145)
TCO2: 24 mmol/L (ref 22–32)

## 2018-06-09 LAB — CBC
HCT: 34.1 % — ABNORMAL LOW (ref 39.0–52.0)
Hemoglobin: 11.1 g/dL — ABNORMAL LOW (ref 13.0–17.0)
MCH: 32.3 pg (ref 26.0–34.0)
MCHC: 32.6 g/dL (ref 30.0–36.0)
MCV: 99.1 fL (ref 78.0–100.0)
Platelets: 116 10*3/uL — ABNORMAL LOW (ref 150–400)
RBC: 3.44 MIL/uL — ABNORMAL LOW (ref 4.22–5.81)
RDW: 12.6 % (ref 11.5–15.5)
WBC: 9 10*3/uL (ref 4.0–10.5)

## 2018-06-09 LAB — BPAM RBC
Blood Product Expiration Date: 201907182359
Blood Product Expiration Date: 201907182359
Blood Product Expiration Date: 201907182359
Blood Product Expiration Date: 201908182359
Unit Type and Rh: 9500
Unit Type and Rh: 9500
Unit Type and Rh: 9500
Unit Type and Rh: 9500

## 2018-06-09 LAB — TROPONIN I: Troponin I: 0.76 ng/mL (ref <0.03)

## 2018-06-09 LAB — MAGNESIUM: Magnesium: 2.3 mg/dL (ref 1.7–2.4)

## 2018-06-09 MED ORDER — METOLAZONE 5 MG PO TABS
5.0000 mg | ORAL_TABLET | Freq: Once | ORAL | Status: AC
  Administered 2018-06-09: 5 mg via ORAL
  Filled 2018-06-09: qty 1

## 2018-06-09 MED ORDER — OXYCODONE HCL 5 MG PO TABS
5.0000 mg | ORAL_TABLET | Freq: Four times a day (QID) | ORAL | Status: DC | PRN
  Administered 2018-06-09: 5 mg via ORAL
  Filled 2018-06-09 (×2): qty 1

## 2018-06-09 MED ORDER — FUROSEMIDE 10 MG/ML IJ SOLN
60.0000 mg | Freq: Two times a day (BID) | INTRAMUSCULAR | Status: DC
  Administered 2018-06-09 – 2018-06-10 (×3): 60 mg via INTRAVENOUS
  Filled 2018-06-09 (×2): qty 6

## 2018-06-09 MED ORDER — SODIUM CHLORIDE 0.9% FLUSH
10.0000 mL | Freq: Two times a day (BID) | INTRAVENOUS | Status: DC
  Administered 2018-06-09 – 2018-06-13 (×6): 10 mL
  Administered 2018-06-13: 20 mL

## 2018-06-09 MED ORDER — CHLORHEXIDINE GLUCONATE CLOTH 2 % EX PADS
6.0000 | MEDICATED_PAD | Freq: Every day | CUTANEOUS | Status: DC
  Administered 2018-06-09 – 2018-06-13 (×5): 6 via TOPICAL

## 2018-06-09 MED ORDER — FUROSEMIDE 10 MG/ML IJ SOLN
80.0000 mg | Freq: Two times a day (BID) | INTRAMUSCULAR | Status: DC
  Administered 2018-06-09: 80 mg via INTRAVENOUS
  Filled 2018-06-09 (×2): qty 8

## 2018-06-09 MED ORDER — SODIUM CHLORIDE 0.9% FLUSH: 10.0000 mL | INTRAVENOUS | Status: DC | PRN

## 2018-06-09 MED ORDER — POTASSIUM CHLORIDE CRYS ER 20 MEQ PO TBCR
20.0000 meq | EXTENDED_RELEASE_TABLET | Freq: Two times a day (BID) | ORAL | Status: DC
  Administered 2018-06-09 – 2018-06-11 (×4): 20 meq via ORAL
  Filled 2018-06-09 (×4): qty 1

## 2018-06-09 MED ORDER — ENSURE ENLIVE PO LIQD
237.0000 mL | Freq: Two times a day (BID) | ORAL | Status: DC
  Administered 2018-06-10 – 2018-06-14 (×6): 237 mL via ORAL

## 2018-06-09 MED FILL — Calcium Chloride Inj 10%: INTRAVENOUS | Qty: 10 | Status: AC

## 2018-06-09 MED FILL — Sodium Chloride IV Soln 0.9%: INTRAVENOUS | Qty: 2000 | Status: AC

## 2018-06-09 MED FILL — Lidocaine HCl(Cardiac) IV PF Soln Pref Syr 100 MG/5ML (2%): INTRAVENOUS | Qty: 10 | Status: AC

## 2018-06-09 MED FILL — Sodium Bicarbonate IV Soln 8.4%: INTRAVENOUS | Qty: 50 | Status: AC

## 2018-06-09 MED FILL — Mannitol IV Soln 20%: INTRAVENOUS | Qty: 500 | Status: AC

## 2018-06-09 MED FILL — Electrolyte-R (PH 7.4) Solution: INTRAVENOUS | Qty: 6000 | Status: AC

## 2018-06-09 MED FILL — Heparin Sodium (Porcine) Inj 1000 Unit/ML: INTRAMUSCULAR | Qty: 20 | Status: AC

## 2018-06-09 NOTE — Plan of Care (Signed)
  Problem: Education: Goal: Knowledge of General Education information will improve Outcome: Progressing   Problem: Health Behavior/Discharge Planning: Goal: Ability to manage health-related needs will improve Outcome: Progressing   Problem: Clinical Measurements: Goal: Ability to maintain clinical measurements within normal limits will improve Outcome: Progressing Goal: Will remain free from infection Outcome: Progressing Goal: Diagnostic test results will improve Outcome: Progressing Goal: Respiratory complications will improve Outcome: Progressing Goal: Cardiovascular complication will be avoided Outcome: Progressing   Problem: Activity: Goal: Risk for activity intolerance will decrease Outcome: Progressing   Problem: Nutrition: Goal: Adequate nutrition will be maintained Outcome: Progressing   Problem: Coping: Goal: Level of anxiety will decrease Outcome: Progressing   Problem: Elimination: Goal: Will not experience complications related to bowel motility Outcome: Progressing Goal: Will not experience complications related to urinary retention Outcome: Progressing   Problem: Pain Managment: Goal: General experience of comfort will improve Outcome: Progressing   Problem: Safety: Goal: Ability to remain free from injury will improve Outcome: Progressing   Problem: Skin Integrity: Goal: Risk for impaired skin integrity will decrease Outcome: Progressing   Problem: Education: Goal: Knowledge of disease or condition will improve Outcome: Progressing Goal: Knowledge of the prescribed therapeutic regimen will improve Outcome: Progressing   Problem: Activity: Goal: Risk for activity intolerance will decrease Outcome: Progressing   Problem: Cardiac: Goal: Hemodynamic stability will improve Outcome: Progressing   Problem: Clinical Measurements: Goal: Postoperative complications will be avoided or minimized Outcome: Progressing   Problem:  Respiratory: Goal: Respiratory status will improve Outcome: Progressing   Problem: Skin Integrity: Goal: Wound healing without signs and symptoms of infection Outcome: Progressing Goal: Risk for impaired skin integrity will decrease Outcome: Progressing   Problem: Urinary Elimination: Goal: Ability to achieve and maintain adequate renal perfusion and functioning will improve Outcome: Progressing

## 2018-06-09 NOTE — Progress Notes (Signed)
      [address]       [place] [address]             [phone]      Up in chair, comfortable  BP (!) 124/52   Pulse 97   Temp 98.4 F (36.9 C) (Oral)   Resp (!) 30   Ht 6' (1.829 m) Comment: Stated.  Wt 272 lb 0.8 oz (123.4 kg)   SpO2 (!) 89%   BMI 36.90 kg/m   Intake/Output Summary (Last 24 hours) at 06/09/2018 1949 Last data filed at 06/09/2018 1900 Gross per 24 hour  Intake 2806 ml  Output 3465 ml  Net -659 ml   Creatinine 1.1, Hct=33 In SR on amiodarone  Doing well POD # 3  Piero Mustard C. Roxan Hockey, MD [facility] [phone]

## 2018-06-09 NOTE — Progress Notes (Signed)
Patient very drowsy this morning. Attempt made to walk but patient noted to be dizzy upon standing and unsteady with use of four wheel walker. Patient assisted back to sitting in recliner and vitals assessed. BP and HR stable. Patient noted to have converted back to sinus rhythm with HR mid to upper 70s. No acute distress noted, but patient reports sleepiness contributing to dizziness. Patient assisted from recliner back to bed to promote rest.

## 2018-06-09 NOTE — Progress Notes (Signed)
3 Days Post-Op Procedure(s) (LRB): CORONARY ARTERY BYPASS GRAFTING (CABG) x 3 WITH ENDOSCOPIC HARVESTING OF RIGHT SAPHENOUS VEIN (N/A) TRANSESOPHAGEAL ECHOCARDIOGRAM (TEE) (N/A) Subjective: Afib Acute postop renal failure improved CXR with atelectasis, edema Objective: Vital signs in last 24 hours: Temp:  [97.9 F (36.6 C)-98.5 F (36.9 C)] 98.5 F (36.9 C) (07/15 0505) Pulse Rate:  [68-121] 85 (07/15 0700) Cardiac Rhythm: Normal sinus rhythm (07/15 0731) Resp:  [15-27] 19 (07/15 0700) BP: (85-145)/(53-92) 142/64 (07/15 0700) SpO2:  [87 %-98 %] 91 % (07/15 0700) Weight:  [272 lb 0.8 oz (123.4 kg)] 272 lb 0.8 oz (123.4 kg) (07/15 0455)  Hemodynamic parameters for last 24 hours:  afebrile  Intake/Output from previous day: 07/14 0701 - 07/15 0700 In: 3096.2 [P.O.:1740; I.V.:1256.3; IV Piggyback:99.9] Out: 1515 [Urine:1515] Intake/Output this shift: No intake/output data recorded.  Alert neuro intact Coarse breath sounds Edema of extremities Lab Results: Recent Labs    06/08/18 2005 06/09/18 0135  WBC 12.3* 9.0  HGB 11.8* 11.1*  HCT 37.4* 34.1*  PLT 95* 116*   BMET:  Recent Labs    06/08/18 1400 06/09/18 0107  NA 133* 136  K 4.4 4.6  CL 102 103  CO2 21* 23  GLUCOSE 126* 106*  BUN 38* 41*  CREATININE 1.82* 1.56*  CALCIUM 8.3* 8.3*    PT/INR:  Recent Labs    06/06/18 1400  LABPROT 15.2  INR 1.21   ABG    Component Value Date/Time   PHART 7.344 (L) 06/06/2018 2202   HCO3 21.3 06/06/2018 2202   TCO2 22 06/06/2018 2202   ACIDBASEDEF 4.0 (H) 06/06/2018 2202   O2SAT 91.0 06/06/2018 2202   CBG (last 3)  Recent Labs    06/08/18 1947 06/08/18 2353 06/09/18 0612  GLUCAP 99 109* 101*    Assessment/Plan: S/P Procedure(s) (LRB): CORONARY ARTERY BYPASS GRAFTING (CABG) x 3 WITH ENDOSCOPIC HARVESTING OF RIGHT SAPHENOUS VEIN (N/A) TRANSESOPHAGEAL ECHOCARDIOGRAM (TEE) (N/A) Mobilize Diuresis cont iv amio   LOS: 3 days    Allen Aquas Trigt  James 06/09/2018

## 2018-06-09 NOTE — Progress Notes (Signed)
Peripherally Inserted Central Catheter/Midline Placement  The IV Nurse has discussed with the patient and/or persons authorized to consent for the patient, the purpose of this procedure and the potential benefits and risks involved with this procedure.  The benefits include less needle sticks, lab draws from the catheter, and the patient may be discharged home with the catheter. Risks include, but not limited to, infection, bleeding, blood clot (thrombus formation), and puncture of an artery; nerve damage and irregular heartbeat and possibility to perform a PICC exchange if needed/ordered by physician.  Alternatives to this procedure were also discussed.  Bard Power PICC patient education guide, fact sheet on infection prevention and patient information card has been provided to patient /or left at bedside.    PICC/Midline Placement Documentation  PICC Double Lumen 06/09/18 PICC Right Brachial 45 cm 0 cm (Active)       Allen James 06/09/2018, 12:56 PM

## 2018-06-09 NOTE — Progress Notes (Signed)
EKG CRITICAL VALUE     12 lead EKG performed.  Critical value noted.  Worthy Keeler, RN notified.   Ihor Gully, CCT 06/09/2018 6:41 AM

## 2018-06-09 NOTE — Progress Notes (Signed)
Patient noted to have much improved pain levels compared to last night. Patient maintaining reports of 2 - 4 with decrease requests for pain medication as intervention. Patient reports breathing easier and is in much better spirits with feeling better. Wife at bedside with request to spend the night with patient. No acute distress noted. Patient continues to fluctuate in and out of Afib to Sinus Rhythm with frequent supraventricular complexes. Amio gtt continues and vitals remain stable without need for pressor support. Oxygen requirements that increase to 10L HFNC this morning has been able to be weaned for 5L via nasal cannula. Patient still requires reinforcement and support with use of incentive spirometer. Will continue to monitor.

## 2018-06-09 NOTE — Progress Notes (Signed)
NS Doris to inform RN, PICC tip in SVC and ready to use.

## 2018-06-09 NOTE — Discharge Summary (Signed)
Physician Discharge Summary  Patient ID: Allen James MRN: [id number] DOB/AGE: 02-14-1941 77 y.o.  Admit date: 06/06/2018 Discharge date: 06/14/2018  Admission Diagnoses:  Patient Active Problem List   Diagnosis Date Noted  . Coronary artery disease 06/06/2018  . Benign essential hypertension 12/20/2016  . Dyspepsia 03/07/2016  . Primary insomnia 03/07/2016  . Obstructive apnea 12/28/2014  . Pure hypercholesterolemia 12/28/2014  . Osteoarthrosis 09/30/2014  . Chronic lower back pain 07/13/2014  . Muscle soreness 04/27/2014  . CAD (coronary artery disease) 04/14/2014  . AKI (acute kidney injury) ([place]) 04/14/2014  . Obesity 04/14/2014  . Adiposity 04/14/2014  . Coronary artery disease involving native coronary artery with angina pectoris ([place]) 04/14/2014  . Injury of kidney 04/14/2014  . OSA on CPAP   . Hyperlipidemia   . NSTEMI (non-ST elevated myocardial infarction) ([place]) 04/12/2014  . Acute non-ST segment elevation myocardial infarction ([place]) 04/12/2014  . Acute non-ST elevation myocardial infarction (NSTEMI) ([place]) 04/12/2014  . HOARSENESS 10/18/2009  . SLEEP APNEA 02/24/2009   Discharge Diagnoses:   Patient Active Problem List   Diagnosis Date Noted  . 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 Coronary artery disease 06/06/2018  . Benign essential hypertension 12/20/2016  . Dyspepsia 03/07/2016  . Primary insomnia 03/07/2016  . Obstructive apnea 12/28/2014  . Pure hypercholesterolemia 12/28/2014  . Osteoarthrosis 09/30/2014  . Chronic lower back pain 07/13/2014  . Muscle soreness 04/27/2014  . CAD (coronary artery disease) 04/14/2014  . AKI (acute kidney injury) ([place]) 04/14/2014  . Obesity 04/14/2014  . Adiposity 04/14/2014  . Coronary artery disease involving native coronary artery with angina pectoris ([place]) 04/14/2014  . Injury of kidney 04/14/2014  . OSA on CPAP   . Hyperlipidemia   . NSTEMI (non-ST elevated myocardial infarction) ([place]) 04/12/2014  . Acute non-ST  segment elevation myocardial infarction ([place]) 04/12/2014  . Acute non-ST elevation myocardial infarction (NSTEMI) ([place]) 04/12/2014  . HOARSENESS 10/18/2009  . SLEEP APNEA 02/24/2009   Discharged Condition: good  History of Present Illness:  Allen James is a 77 yo male with known history of Hyperlipidemia and known CAD, S/P NSTEMI with DES to RCA x 2.  He has been in his normal state of health until recently.  Over the past several weeks the patient has noticed an increase in chest discomfort with exertion which is increasing in intensity and frequency.  Due to his current symptoms it was felt he should undergo cardiac catheterization.  This was performed on 05/30/2018 which showed mutlivessel CAD.  It was felt coronary bypass grafting would be indicated and TCTS consult was obtained.  He was evaluated by Dr. Prescott Gum who was in agreement the patient would benefit from bypass procedure.  However, being he was on Plavix prior to admission he would need to wait 5 days prior to proceeding with surgery.  The risks and benefits of the procedure were explained to the patient and he was agreeable to proceed.   Hospital Course:   He remained chest pain free during his hospitalization.  He was taken to the operating room and underwent CABG x 3 on 06/06/2018.  This was done utilizing LIMA to LAD, SVG to Diagonal, and SVG to OM and endoscopic harvest of greater saphenous vein from his right leg.  He tolerated the procedure without difficulty and was taken to the SICU in stable condition.  He was extubated the evening of surgery.  During his stay in the SICU the patient was weaned off Neo synephrine and Dopamine as tolerated.  His chest tubes and arterial lines were removed without difficulty.  He was hypokalemic and his potassium supplemented accordingly.  He developed an acute elevation in his creatinine level.  He was kept off all nephrotoxic agents.  He developed atrial fibrillation and was started on IV  Amiodarone.  His BB dose was titrated.  He converted to NSR.  EKG developed mild acute ST changes and troponin levels were checked and were acceptable post coronary bypass procedure.  He was started on diuretics for aggressive hypervolemia.  He is ambulating without difficulty.  He was felt to be medically stable for transfer to the telemetry unit on 06/11/2018.  The patient continued to make progress.  He continues to maintain NSR.  He remained volume overloaded and was treated with Metolazone and Lasix.  His pacing wires were removed without difficulty.  He was hypokalemic due to aggressive diuretic use.  His potassium was supplemented accordingly with his most recent level being 3.6.  His pain is well controlled.  He continues to ambulate without difficulty.  He is medically stable for discharge home today.           Significant Diagnostic Studies: angiography:    The left ventricular systolic function is normal.  LV end diastolic pressure is mildly elevated.  The left ventricular ejection fraction is 55-65% by visual estimate.  Ost LAD lesion is 90% stenosed.  Prox LAD lesion is 50% stenosed.  Mid LAD lesion is 40% stenosed.  2nd Diag lesion is 40% stenosed.  Ost 1st Mrg lesion is 50% stenosed.  Previously placed 1st Mrg stent (unknown type) is widely patent.  Previously placed Prox RCA to Mid RCA stent (unknown type) is widely patent.  Previously placed Mid RCA to Dist RCA stent (unknown type) is widely patent.  Mid RCA lesion is 50% stenosed.  Treatments: surgery:   1.  Coronary artery bypass grafting x3 (left internal mammary artery to left anterior descending, saphenous vein graft to diagonal, saphenous vein graft to circumflex marginal). 2.  Endoscopic harvest of right leg greater saphenous vein.  Discharge Exam: Blood pressure 118/61, pulse 74, temperature 98.3 F (36.8 C), temperature source Oral, resp. rate (!) 23, height 6' (1.829 m), weight 250 lb (113.4 kg), SpO2 94  %.   General appearance: alert, cooperative and no distress Heart: regular rate and rhythm Lungs: clear to auscultation bilaterally Abdomen: soft, non-tender; bowel sounds normal; no masses,  no organomegaly Extremities: edema trace Wound: clean and dry  Disposition:Home  Discharge Medications:  The patient has been discharged on:   1.Beta Blocker:  Yes [x   ]                              No   [   ]                              If No, reason:  2.Ace Inhibitor/ARB: Yes [   ]                                     No  [ x   ]  If No, reason: elevated creatinine  3.Statin:   Yes [ x]                  No  [   ]                  If No, reason:  4.Ecasa:  Yes  [x   ]                  No   [   ]                  If No, reason:     Discharge Instructions    Amb Referral to Cardiac Rehabilitation   Complete by:  As directed    Diagnosis:  CABG   CABG X ___:  3     Allergies as of 06/14/2018   No Known Allergies     Medication List    STOP taking these medications   amLODipine 5 MG tablet Commonly known as:  NORVASC   isosorbide mononitrate 120 MG 24 hr tablet Commonly known as:  IMDUR   lovastatin 20 MG tablet Commonly known as:  MEVACOR     TAKE these medications   amiodarone 400 MG tablet Commonly known as:  PACERONE Take 1 tablet (400 mg total) by mouth 2 (two) times daily.   aspirin EC 81 MG tablet Take 81 mg by mouth daily.   atorvastatin 40 MG tablet Commonly known as:  LIPITOR Take 1 tablet (40 mg total) by mouth daily at 6 PM.   docusate sodium 250 MG capsule Commonly known as:  COLACE Take 250 mg by mouth daily as needed for constipation.   furosemide 40 MG tablet Commonly known as:  LASIX Take 1 tablet (40 mg total) by mouth daily. For 7 days Start taking on:  06/15/2018   HYDROcodone-acetaminophen 5-325 MG tablet Commonly known as:  NORCO/VICODIN Take 1 tablet by mouth 3 (three) times daily as needed for  moderate pain.   meloxicam 15 MG tablet Commonly known as:  MOBIC Take 15 mg by mouth daily.   metoprolol succinate 50 MG 24 hr tablet Commonly known as:  TOPROL-XL Take 1 tablet (50 mg total) by mouth daily.   nitroGLYCERIN 0.4 MG SL tablet Commonly known as:  NITROSTAT Place 1 tablet (0.4 mg total) under the tongue every 5 (five) minutes as needed for chest pain.   pantoprazole 40 MG tablet Commonly known as:  PROTONIX Take 1 tablet (40 mg total) by mouth daily.   potassium chloride SA 20 MEQ tablet Commonly known as:  K-DUR,KLOR-CON Take 2 tablets (40 mEq total) by mouth daily. Start taking on:  06/15/2018   traMADol 50 MG tablet Commonly known as:  ULTRAM Take 100 mg by mouth every 8 (eight) hours as needed for moderate pain.            Durable Medical Equipment  (From admission, onward)        Start     Ordered   06/13/18 1331  For home use only DME 4 wheeled rolling walker with seat  Once    Question:  Patient needs a walker to treat with the following condition  Answer:  S/P CABG x 3   06/13/18 1331   06/12/18 0937  For home use only DME 3 n 1  Once     06/12/18 6761     Follow-up Information    Ivin Poot, MD Follow up on  07/07/2018.   Specialty:  Cardiothoracic Surgery Why:  Appointment is at 3:30, please get CXR at 3:00 at [facility] located on first floor of our office building. Contact information: [address] [place] [address] [phone]        Burtis Junes, NP Follow up on 06/25/2018.   Specialties:  Nurse Practitioner, Interventional Cardiology, Cardiology, Radiology Why:  Appointment is at 11:00 Contact information: [place] [place] [address] [phone]           Signed: Ellwood Handler 06/14/2018, 8:49 AM

## 2018-06-09 NOTE — Discharge Instructions (Signed)

## 2018-06-09 NOTE — Evaluation (Signed)
Physical Therapy Evaluation Patient Details Name: Allen James MRN: [id number] DOB: 02/23/1941 Today's Date: 06/09/2018   History of Present Illness  77 yo admitted with NSTEMI s/p CABG x 3. PMHx: CAD, OSA, obesity  Clinical Impression  Pt pleasant in bed on arrival and reports normally a very active life involving golf. Pt has a stair lift at home and has been using it for several years. Pt able to perform mobility with minguard-min assist level and if he can achieve goals then return home is highly probable. Pt on 3L O2 on arrival and ambulated on 6L as unable to obtain SpO2 until seated in chair end of session. Pt with decreased ability with transfer, gait and functional mobility adhering to precautions and will benefit from acute therapy to maximize mobility, safety and function adhering to precautions to decrease burden of care.   HR 110-120 135/99 SpO2 93% on 3L at rest    Follow Up Recommendations Home health PT    Equipment Recommendations  Rolling walker with 5" wheels    Recommendations for Other Services OT consult     Precautions / Restrictions Precautions Precautions: Sternal Precaution Booklet Issued: Yes (comment)      Mobility  Bed Mobility Overal bed mobility: Needs Assistance Bed Mobility: Rolling;Sidelying to Sit Rolling: Min guard Sidelying to sit: Min assist       General bed mobility comments: cues for sequence and to maintain precautions, very limited assist to elevate trunk from surface  Transfers Overall transfer level: Needs assistance   Transfers: Sit to/from Stand Sit to Stand: Min guard         General transfer comment: cues for hand placement  Ambulation/Gait Ambulation/Gait assistance: Min guard Gait Distance (Feet): 190 Feet Assistive device: Rolling walker (2 wheeled) Gait Pattern/deviations: Step-through pattern;Decreased stride length   Gait velocity interpretation: >2.62 ft/sec, indicative of community ambulatory General  Gait Details: cues for posture and breathing technique  Stairs            Wheelchair Mobility    Modified Rankin (Stroke Patients Only)       Balance Overall balance assessment: Needs assistance   Sitting balance-Leahy Scale: Good       Standing balance-Leahy Scale: Fair                               Pertinent Vitals/Pain Pain Assessment: No/denies pain    Home Living Family/patient expects to be discharged to:: Private residence Living Arrangements: Spouse/significant other Available Help at Discharge: Family;Available 24 hours/day Type of Home: House Home Access: Stairs to enter   [employer] of Steps: 1 Home Layout: Two level;Bed/bath upstairs Home Equipment: None      Prior Function Level of Independence: Independent         Comments: pt plays golf several times a week      Hand Dominance        Extremity/Trunk Assessment   Upper Extremity Assessment Upper Extremity Assessment: Overall WFL for tasks assessed    Lower Extremity Assessment Lower Extremity Assessment: Overall WFL for tasks assessed    Cervical / Trunk Assessment Cervical / Trunk Assessment: Normal  Communication   Communication: No difficulties  Cognition Arousal/Alertness: Awake/alert Behavior During Therapy: WFL for tasks assessed/performed Overall Cognitive Status: Within Functional Limits for tasks assessed  General Comments      Exercises     Assessment/Plan    PT Assessment Patient needs continued PT services  PT Problem List Decreased mobility;Decreased activity tolerance;Decreased knowledge of use of DME;Decreased balance;Cardiopulmonary status limiting activity       PT Treatment Interventions DME instruction;Therapeutic activities;Gait training;Therapeutic exercise;Patient/family education;Functional mobility training    PT Goals (Current goals can be found in the Care Plan  section)  Acute Rehab PT Goals Patient Stated Goal: return to golf PT Goal Formulation: With patient Time For Goal Achievement: 06/23/18 Potential to Achieve Goals: Good    Frequency Min 3X/week   Barriers to discharge Decreased caregiver support pt reports wife has a bad back    Co-evaluation               AM-PAC PT "6 Clicks" Daily Activity  Outcome Measure Difficulty turning over in bed (including adjusting bedclothes, sheets and blankets)?: Unable Difficulty moving from lying on back to sitting on the side of the bed? : Unable Difficulty sitting down on and standing up from a chair with arms (e.g., wheelchair, bedside commode, etc,.)?: A Little Help needed moving to and from a bed to chair (including a wheelchair)?: A Little Help needed walking in hospital room?: A Little Help needed climbing 3-5 steps with a railing? : Total 6 Click Score: 12    End of Session Equipment Utilized During Treatment: Gait belt;Oxygen Activity Tolerance: Patient tolerated treatment well Patient left: in chair;with call bell/phone within reach;with nursing/sitter in room Nurse Communication: Mobility status PT Visit Diagnosis: Other abnormalities of gait and mobility (R26.89);Muscle weakness (generalized) (M62.81)    Time: 7371-0626 PT Time Calculation (min) (ACUTE ONLY): 19 min   Charges:   PT Evaluation $PT Eval Moderate Complexity: 1 Mod     PT G Codes:        Elwyn Reach, PT [phone]   Zekiah Caruth B Kiwanna Spraker 06/09/2018, 1:12 PM

## 2018-06-10 ENCOUNTER — Inpatient Hospital Stay (HOSPITAL_COMMUNITY): Payer: Medicare HMO

## 2018-06-10 LAB — BASIC METABOLIC PANEL
Anion gap: 12 (ref 5–15)
Anion gap: 13 (ref 5–15)
BUN: 33 mg/dL — ABNORMAL HIGH (ref 8–23)
BUN: 31 mg/dL — ABNORMAL HIGH (ref 8–23)
CO2: 30 mmol/L (ref 22–32)
CO2: 27 mmol/L (ref 22–32)
Calcium: 8.7 mg/dL — ABNORMAL LOW (ref 8.9–10.3)
Calcium: 8.2 mg/dL — ABNORMAL LOW (ref 8.9–10.3)
Chloride: 97 mmol/L — ABNORMAL LOW (ref 98–111)
Chloride: 94 mmol/L — ABNORMAL LOW (ref 98–111)
Creatinine, Ser: 1.06 mg/dL (ref 0.61–1.24)
Creatinine, Ser: 1.26 mg/dL — ABNORMAL HIGH (ref 0.61–1.24)
GFR calc Af Amer: >60 mL/min (ref >60)
GFR calc Af Amer: >60 mL/min (ref >60)
GFR calc non Af Amer: >60 mL/min (ref >60)
GFR calc non Af Amer: 54 mL/min — ABNORMAL LOW (ref >60)
Glucose, Bld: 120 mg/dL — ABNORMAL HIGH (ref 70–99)
Glucose, Bld: 181 mg/dL — ABNORMAL HIGH (ref 70–99)
Potassium: 3.4 mmol/L — ABNORMAL LOW (ref 3.5–5.1)
Potassium: 2.9 mmol/L — ABNORMAL LOW (ref 3.5–5.1)
Sodium: 139 mmol/L (ref 135–145)
Sodium: 134 mmol/L — ABNORMAL LOW (ref 135–145)

## 2018-06-10 LAB — TYPE AND SCREEN
ABO/RH(D): O NEG
Antibody Screen: POSITIVE
Unit division: 0
Unit division: 0
Unit division: 0
Unit division: 0
Unit division: 0

## 2018-06-10 LAB — COOXEMETRY PANEL
Carboxyhemoglobin: 1.2 % (ref 0.5–1.5)
Methemoglobin: 1.6 % — ABNORMAL HIGH (ref 0.0–1.5)
O2 Saturation: 65.9 %
Total hemoglobin: 11.3 g/dL — ABNORMAL LOW (ref 12.0–16.0)

## 2018-06-10 LAB — CBC
HCT: 33.9 % — ABNORMAL LOW (ref 39.0–52.0)
Hemoglobin: 11.2 g/dL — ABNORMAL LOW (ref 13.0–17.0)
MCH: 31.5 pg (ref 26.0–34.0)
MCHC: 33 g/dL (ref 30.0–36.0)
MCV: 95.5 fL (ref 78.0–100.0)
Platelets: 144 10*3/uL — ABNORMAL LOW (ref 150–400)
RBC: 3.55 MIL/uL — ABNORMAL LOW (ref 4.22–5.81)
RDW: 12.2 % (ref 11.5–15.5)
WBC: 7.4 10*3/uL (ref 4.0–10.5)

## 2018-06-10 LAB — BPAM RBC
Blood Product Expiration Date: 201908122359
Blood Product Expiration Date: 201908162359
Blood Product Expiration Date: 201908192359
Blood Product Expiration Date: 201908202359
Blood Product Expiration Date: 201908202359
ISSUE DATE / TIME: 201907160259
Unit Type and Rh: 5100
Unit Type and Rh: 9500
Unit Type and Rh: 9500
Unit Type and Rh: 9500
Unit Type and Rh: 9500

## 2018-06-10 LAB — GLUCOSE, CAPILLARY
Glucose-Capillary: 116 mg/dL — ABNORMAL HIGH (ref 70–99)
Glucose-Capillary: 113 mg/dL — ABNORMAL HIGH (ref 70–99)
Glucose-Capillary: 103 mg/dL — ABNORMAL HIGH (ref 70–99)

## 2018-06-10 MED ORDER — POTASSIUM CHLORIDE 10 MEQ/50ML IV SOLN
10.0000 meq | INTRAVENOUS | Status: AC | PRN
  Administered 2018-06-10 (×3): 10 meq via INTRAVENOUS
  Filled 2018-06-10: qty 50

## 2018-06-10 MED ORDER — AMIODARONE LOAD VIA INFUSION
150.0000 mg | Freq: Once | INTRAVENOUS | Status: AC
  Administered 2018-06-10: 150 mg via INTRAVENOUS
  Filled 2018-06-10: qty 83.34

## 2018-06-10 MED ORDER — FUROSEMIDE 10 MG/ML IJ SOLN: 80.0000 mg | Freq: Every day | INTRAMUSCULAR | Status: DC

## 2018-06-10 MED ORDER — POTASSIUM CHLORIDE CRYS ER 20 MEQ PO TBCR: 20.0000 meq | EXTENDED_RELEASE_TABLET | ORAL | Status: DC | PRN

## 2018-06-10 MED ORDER — POTASSIUM CHLORIDE 10 MEQ/50ML IV SOLN
10.0000 meq | INTRAVENOUS | Status: AC
Start: 2018-06-10 — End: 2018-06-10
  Administered 2018-06-10 (×2): 10 meq via INTRAVENOUS
  Filled 2018-06-10 (×2): qty 50

## 2018-06-10 MED ORDER — METOPROLOL TARTRATE 25 MG PO TABS
25.0000 mg | ORAL_TABLET | Freq: Two times a day (BID) | ORAL | Status: DC
  Administered 2018-06-10 – 2018-06-14 (×9): 25 mg via ORAL
  Filled 2018-06-10 (×9): qty 1

## 2018-06-10 MED ORDER — POTASSIUM CHLORIDE 10 MEQ/50ML IV SOLN
10.0000 meq | INTRAVENOUS | Status: DC
  Administered 2018-06-10: 10 meq via INTRAVENOUS

## 2018-06-10 MED ORDER — AMIODARONE IV BOLUS ONLY 150 MG/100ML: 150.0000 mg | Freq: Once | INTRAVENOUS | Status: DC

## 2018-06-10 MED ORDER — POTASSIUM CHLORIDE 10 MEQ/50ML IV SOLN
10.0000 meq | INTRAVENOUS | Status: AC
  Administered 2018-06-10 – 2018-06-11 (×4): 10 meq via INTRAVENOUS
  Filled 2018-06-10 (×5): qty 50

## 2018-06-10 MED ORDER — TORSEMIDE 20 MG PO TABS
20.0000 mg | ORAL_TABLET | Freq: Once | ORAL | Status: AC
  Administered 2018-06-10: 20 mg via ORAL
  Filled 2018-06-10: qty 1

## 2018-06-10 MED ORDER — METOLAZONE 5 MG PO TABS
5.0000 mg | ORAL_TABLET | Freq: Every day | ORAL | Status: DC
  Administered 2018-06-11: 5 mg via ORAL
  Filled 2018-06-10: qty 1

## 2018-06-10 MED ORDER — SORBITOL 70 % SOLN
45.0000 mL | Freq: Once | Status: AC
  Administered 2018-06-10: 45 mL via ORAL
  Filled 2018-06-10: qty 60

## 2018-06-10 NOTE — Progress Notes (Signed)
4 Days Post-Op Procedure(s) (LRB): CORONARY ARTERY BYPASS GRAFTING (CABG) x 3 WITH ENDOSCOPIC HARVESTING OF RIGHT SAPHENOUS VEIN (N/A) TRANSESOPHAGEAL ECHOCARDIOGRAM (TEE) (N/A) Subjective: Improving with diuresis CXR remains congested Some recurrent afib Co-ox > 60%, wean off dopamine Objective: Vital signs in last 24 hours: Temp:  [97.6 F (36.4 C)-98.8 F (37.1 C)] 97.6 F (36.4 C) (07/16 0736) Pulse Rate:  [72-102] 97 (07/16 0400) Cardiac Rhythm: Atrial flutter (07/16 0400) Resp:  [13-30] 24 (07/16 0600) BP: (96-167)/(52-99) 128/73 (07/16 0600) SpO2:  [89 %-96 %] 89 % (07/16 0400) Weight:  [264 lb 8.8 oz (120 kg)] 264 lb 8.8 oz (120 kg) (07/16 0600)  Hemodynamic parameters for last 24 hours:  stable  Intake/Output from previous day: 07/15 0701 - 07/16 0700 In: 1570.9 [P.O.:480; I.V.:853.9] Out: 5150 [Urine:5150] Intake/Output this shift: No intake/output data recorded.       Exam    General- alert and comfortable    Neck- no JVD, no cervical adenopathy palpable, no carotid bruit   Lungs- clear without rales, wheezes   Cor- regular rate and rhythm, no murmur , gallop   Abdomen- soft, non-tender   Extremities - warm, non-tender, minimal edema   Neuro- oriented, appropriate, no focal weakness   Lab Results: Recent Labs    06/09/18 0135 06/09/18 1608 06/10/18 0423  WBC 9.0  --  7.4  HGB 11.1* 11.2* 11.2*  HCT 34.1* 33.0* 33.9*  PLT 116*  --  144*   BMET:  Recent Labs    06/09/18 0107 06/09/18 1608 06/10/18 0423  NA 136 132* 139  K 4.6 3.5 3.4*  CL 103 92* 97*  CO2 23  --  30  GLUCOSE 106* 148* 120*  BUN 41* 34* 33*  CREATININE 1.56* 1.10 1.06  CALCIUM 8.3*  --  8.7*    PT/INR: No results for input(s): LABPROT, INR in the last 72 hours. ABG    Component Value Date/Time   PHART 7.354 06/09/2018 0821   HCO3 24.4 06/09/2018 0821   TCO2 24 06/09/2018 1608   ACIDBASEDEF 1.0 06/09/2018 0821   O2SAT 65.9 06/10/2018 0425   CBG (last 3)  Recent  Labs    06/09/18 2028 06/10/18 0426 06/10/18 0733  GLUCAP 108* 116* 113*    Assessment/Plan: S/P Procedure(s) (LRB): CORONARY ARTERY BYPASS GRAFTING (CABG) x 3 WITH ENDOSCOPIC HARVESTING OF RIGHT SAPHENOUS VEIN (N/A) TRANSESOPHAGEAL ECHOCARDIOGRAM (TEE) (N/A) Cont bid iv lasix cont iv amiodarone Keep in ICU  LOS: 4 days    Tharon Aquas Trigt III 06/10/2018

## 2018-06-10 NOTE — Progress Notes (Signed)
CT surgery p.m. Rounds  Patient had a good day Ambulating in hallway Maintaining sinus rhythm, O2 saturation 92% Receiving IV Lasix We will DC Foley catheter tomorrow and transition from IV amiodarone to oral amiodarone

## 2018-06-10 NOTE — Evaluation (Signed)
Occupational Therapy Evaluation Patient Details Name: Allen James MRN: [id number] DOB: [birth year] Today's Date: 06/10/2018    History of Present Illness 77 yo admitted with NSTEMI s/p CABG x 3. PMHx: CAD, OSA, obesity   Clinical Impression   PTA, pt was living with his wife and was independent and enjoyed playing golf several times a week. Pt currently requiring Min A for UB ADLs, Mod A for LB ADLs, Mod A for toileting, and Min Guard A for functional mobility with RW. Providing pt on education for sternal precautions and compensatory techniques for toileting. VSS throughout session; see general comments. Pt will require further acute OT to address sternal precautions during bathing, dressing, and functional transfers. Recommend dc to home with HHOT for further OT to increase independence and safety with ADLs and functional mobility and facilitate return to PLOF.     Follow Up Recommendations  Home health OT;Supervision/Assistance - 24 hour    Equipment Recommendations  3 in 1 bedside commode    Recommendations for Other Services PT consult     Precautions / Restrictions Precautions Precautions: Sternal Precaution Booklet Issued: Yes (comment) Precaution Comments: Reviewed sternal precautions and adherance during ADLs Restrictions Weight Bearing Restrictions: (Sternal precautions)      Mobility Bed Mobility               General bed mobility comments: OOB on BSC upon arrival  Transfers Overall transfer level: Needs assistance Equipment used: Rolling walker (2 wheeled) Transfers: Sit to/from Stand Sit to Stand: Min guard         General transfer comment: cues for hand placement    Balance Overall balance assessment: Needs assistance Sitting-balance support: Feet supported;No upper extremity supported Sitting balance-Leahy Scale: Good     Standing balance support: During functional activity;No upper extremity supported Standing balance-Leahy Scale: Fair                              ADL either performed or assessed with clinical judgement   ADL Overall ADL's : Needs assistance/impaired Eating/Feeding: Set up;Sitting   Grooming: Set up;Sitting   Upper Body Bathing: Minimal assistance;Sitting   Lower Body Bathing: Sit to/from stand;Moderate assistance   Upper Body Dressing : Minimal assistance;Sitting   Lower Body Dressing: Sit to/from stand;Moderate assistance   Toilet Transfer: Minimal assistance;Ambulation;BSC;RW   Toileting- Clothing Manipulation and Hygiene: Moderate assistance;Sit to/from stand;Cueing for sequencing;Cueing for compensatory techniques Toileting - Clothing Manipulation Details (indicate cue type and reason): Educating pt on compensatory techniques for toilet hygiene after BM. Pt performing toilet hygiene and requiring Mod A to complete clean after BM.      Functional mobility during ADLs: Min guard;Rolling walker General ADL Comments: Pt with high motivation to participate in therapy and return to [facility]     Vision         Perception     Praxis      Pertinent Vitals/Pain Pain Assessment: Faces Faces Pain Scale: No hurt Pain Intervention(s): Monitored during session     Hand Dominance Right   Extremity/Trunk Assessment Upper Extremity Assessment Upper Extremity Assessment: Overall WFL for tasks assessed   Lower Extremity Assessment Lower Extremity Assessment: Overall WFL for tasks assessed   Cervical / Trunk Assessment Cervical / Trunk Assessment: Normal;Other exceptions Cervical / Trunk Exceptions: s/p CABG   Communication Communication Communication: No difficulties   Cognition Arousal/Alertness: Awake/alert Behavior During Therapy: WFL for tasks assessed/performed Overall Cognitive Status: Within Functional Limits for tasks  assessed                                     General Comments  HR 86 and SpO2 90s    Exercises     Shoulder Instructions      Home  Living Family/patient expects to be discharged to:: Private residence Living Arrangements: Spouse/significant other Available Help at Discharge: Family;Available 24 hours/day Type of Home: House Home Access: Stairs to enter [employer] of Steps: 1   Home Layout: Two level;Bed/bath upstairs Alternate Level Stairs-Number of Steps: pt has a stair lift   Bathroom Shower/Tub: Occupational psychologist: Handicapped height     Home Equipment: None          Prior Functioning/Environment Level of Independence: Independent        Comments: pt plays golf several times a week         OT Problem List: Decreased strength;Decreased range of motion;Decreased activity tolerance;Impaired balance (sitting and/or standing);Decreased knowledge of use of DME or AE;Decreased knowledge of precautions;Pain      OT Treatment/Interventions: Self-care/ADL training;Therapeutic exercise;Energy conservation;DME and/or AE instruction;Therapeutic activities;Patient/family education    OT Goals(Current goals can be found in the care plan section) Acute Rehab OT Goals Patient Stated Goal: return to golf OT Goal Formulation: With patient Time For Goal Achievement: 06/24/18 Potential to Achieve Goals: Good ADL Goals Pt Will Perform Grooming: with set-up;with supervision;standing Pt Will Perform Upper Body Dressing: with set-up;with supervision;sitting Pt Will Perform Lower Body Dressing: with set-up;with supervision;sit to/from stand(with or without AE) Pt Will Transfer to Toilet: with set-up;with supervision;ambulating;bedside commode Pt Will Perform Toileting - Clothing Manipulation and hygiene: with set-up;with supervision;sit to/from stand  OT Frequency: Min 3X/week   Barriers to D/C:            Co-evaluation              AM-PAC PT "6 Clicks" Daily Activity     Outcome Measure Help from another person eating meals?: None Help from another person taking care of personal  grooming?: None Help from another person toileting, which includes using toliet, bedpan, or urinal?: A Lot Help from another person bathing (including washing, rinsing, drying)?: A Lot Help from another person to put on and taking off regular upper body clothing?: A Little Help from another person to put on and taking off regular lower body clothing?: A Lot 6 Click Score: 17   End of Session Equipment Utilized During Treatment: Gait belt;Oxygen(2L) Nurse Communication: Mobility status;Precautions  Activity Tolerance: Patient tolerated treatment well Patient left: in chair;with call bell/phone within reach;with nursing/sitter in room  OT Visit Diagnosis: Unsteadiness on feet (R26.81);Other abnormalities of gait and mobility (R26.89);Muscle weakness (generalized) (M62.81);Pain Pain - part of body: (Chest)                Time: 6160-7371 OT Time Calculation (min): 21 min Charges:  OT General Charges $OT Visit: 1 Visit OT Evaluation $OT Eval Moderate Complexity: 1 Mod G-Codes:     [place] MSOT, OTR/L Acute Rehab Pager: [phone] Office: [place] 06/10/2018, 10:50 AM

## 2018-06-10 NOTE — Progress Notes (Signed)
[facility] DAILY ICU PROGRESS NOTE                   [address]            [place] [address]          [phone]   4 Days Post-Op Procedure(s) (LRB): CORONARY ARTERY BYPASS GRAFTING (CABG) x 3 WITH ENDOSCOPIC HARVESTING OF RIGHT SAPHENOUS VEIN (N/A) TRANSESOPHAGEAL ECHOCARDIOGRAM (TEE) (N/A)  Total Length of Stay:  LOS: 4 days   Subjective:  Patient doing better today.  He does have episodes of diaphoresis at times where he gets very hot.  Edison Simon does help provide relief.  + ambulation  No BM, + flatus  Objective: Vital signs in last 24 hours: Temp:  [97.6 F (36.4 C)-98.8 F (37.1 C)] 97.6 F (36.4 C) (07/16 0736) Pulse Rate:  [72-102] 97 (07/16 0400) Cardiac Rhythm: Atrial flutter (07/16 0400) Resp:  [13-30] 24 (07/16 0600) BP: (96-167)/(52-99) 128/73 (07/16 0600) SpO2:  [89 %-96 %] 89 % (07/16 0400) Weight:  [264 lb 8.8 oz (120 kg)] 264 lb 8.8 oz (120 kg) (07/16 0600)  Filed Weights   06/08/18 0500 06/09/18 0455 06/10/18 0600  Weight: 271 lb 13.2 oz (123.3 kg) 272 lb 0.8 oz (123.4 kg) 264 lb 8.8 oz (120 kg)    Weight change: -7 lb 7.9 oz (-3.4 kg)    Intake/Output from previous day: 07/15 0701 - 07/16 0700 In: 1570.9 [P.O.:480; I.V.:853.9] Out: 5150 [Urine:5150]  Current Meds: Scheduled Meds: . acetaminophen  1,000 mg Oral Q6H   Or  . acetaminophen (TYLENOL) oral liquid 160 mg/5 mL  1,000 mg Per Tube Q6H  . amiodarone  150 mg Intravenous Once  . aspirin EC  325 mg Oral Daily   Or  . aspirin  324 mg Per Tube Daily  . bisacodyl  10 mg Oral Daily   Or  . bisacodyl  10 mg Rectal Daily  . Chlorhexidine Gluconate Cloth  6 each Topical Daily  . Chlorhexidine Gluconate Cloth  6 each Topical Daily  . docusate sodium  200 mg Oral Daily  . feeding supplement (ENSURE ENLIVE)  237 mL Oral BID BM  . furosemide  60 mg Intravenous BID  . insulin aspart  0-24 Units Subcutaneous Q4H  . mouth rinse  15 mL Mouth Rinse BID  . metoCLOPramide (REGLAN) injection   10 mg Intravenous Q6H  . metoprolol tartrate  25 mg Oral BID  . pantoprazole  40 mg Oral Daily  . potassium chloride  20 mEq Oral BID  . pravastatin  20 mg Oral q1800  . sodium chloride flush  10-40 mL Intracatheter Q12H  . sodium chloride flush  10-40 mL Intracatheter Q12H  . sodium chloride flush  3 mL Intravenous Q12H   Continuous Infusions: . sodium chloride Stopped (06/07/18 2213)  . amiodarone 30 mg/hr (06/10/18 0600)  . DOPamine 2.5 mcg/kg/min (06/10/18 0600)  . potassium chloride 10 mEq (06/10/18 0601)  . potassium chloride 10 mEq (06/10/18 0803)   PRN Meds:.sodium chloride, metoprolol tartrate, morphine injection, ondansetron (ZOFRAN) IV, oxyCODONE, potassium chloride, sodium chloride flush, sodium chloride flush, sodium chloride flush  General appearance: alert, cooperative and no distress Heart: irregularly irregular rhythm Lungs: clear to auscultation bilaterally Abdomen: soft, non-tender; bowel sounds normal; no masses,  no organomegaly Extremities: edema 1+ pitting Wound: clean and dry  Lab Results: CBC: Recent Labs    06/09/18 0135 06/09/18 1608 06/10/18 0423  WBC 9.0  --  7.4  HGB  11.1* 11.2* 11.2*  HCT 34.1* 33.0* 33.9*  PLT 116*  --  144*   BMET:  Recent Labs    06/09/18 0107 06/09/18 1608 06/10/18 0423  NA 136 132* 139  K 4.6 3.5 3.4*  CL 103 92* 97*  CO2 23  --  30  GLUCOSE 106* 148* 120*  BUN 41* 34* 33*  CREATININE 1.56* 1.10 1.06  CALCIUM 8.3*  --  8.7*    CMET: Lab Results  Component Value Date   WBC 7.4 06/10/2018   HGB 11.2 (L) 06/10/2018   HCT 33.9 (L) 06/10/2018   PLT 144 (L) 06/10/2018   GLUCOSE 120 (H) 06/10/2018   CHOL 183 05/03/2016   TRIG 147 05/03/2016   HDL 39 (L) 05/03/2016   LDLCALC 115 05/03/2016   ALT 21 06/08/2018   AST 40 06/08/2018   NA 139 06/10/2018   K 3.4 (L) 06/10/2018   CL 97 (L) 06/10/2018   CREATININE 1.06 06/10/2018   BUN 33 (H) 06/10/2018   CO2 30 06/10/2018   TSH 3.251 06/08/2018   INR 1.21  06/06/2018   HGBA1C 6.0 (H) 06/05/2018      PT/INR: No results for input(s): LABPROT, INR in the last 72 hours. Radiology: Dg Chest Port 1 View  Result Date: 06/10/2018 CLINICAL DATA:  77 year old male postoperative day 4 status post CABG, shortness of breath. EXAM: PORTABLE CHEST 1 VIEW COMPARISON:  06/09/2018 and earlier. FINDINGS: Portable AP semi upright view at 0455 hours. The right IJ introducer sheath has been removed. Right PICC line remains in place. Stable lung volumes. Patchy in veiling lower lung opacity greater on the left is stable with no superimposed pneumothorax. Increased pulmonary vascularity since yesterday, and compared to preoperative film 06/05/2018. Continued obscuration of the left hemidiaphragm. Stable cardiac size and mediastinal contours. IMPRESSION: 1. Right IJ introducer sheath removed.  Right PICC line remains. 2. Appearance suspicious for mild acute pulmonary edema, increased since yesterday, superimposed on left greater than right pleural effusions and atelectasis. Electronically Signed   By: Genevie Ann M.D.   On: 06/10/2018 07:39   Dg Chest Port 1 View  Result Date: 06/09/2018 CLINICAL DATA:  Status post central line placement. EXAM: PORTABLE CHEST 1 VIEW COMPARISON:  06/09/2018 at 5:53 a.m. FINDINGS: Right PICC has its tip projecting in the mid to lower superior vena cava. Right internal jugular introducer sheath is stable. There is no significant change in the lung aeration when compared to the earlier exam allowing for differences in patient positioning and technique. No pneumothorax. IMPRESSION: Right PICC tip projects in the mid to lower superior vena cava. Electronically Signed   By: Lajean Manes M.D.   On: 06/09/2018 13:18     Assessment/Plan: S/P Procedure(s) (LRB): CORONARY ARTERY BYPASS GRAFTING (CABG) x 3 WITH ENDOSCOPIC HARVESTING OF RIGHT SAPHENOUS VEIN (N/A) TRANSESOPHAGEAL ECHOCARDIOGRAM (TEE) (N/A)  1. CV- A. Fib rate controlled, HTN- will  continue IV Amiodarone today, increase Lopressor to 25 mg BID today see how BP does, if remains elevated can add antihypertensive agent tomorrow... If unable to convert to NSR will need Eliquis at discharge 2. Pulm- wean oxygen as tolerated, CXR with increased pulmonary vasculature/edema this morning, no significant effusions, continue IS 3. Renal- creatinine down to 1.06, wean Dopamine as U/O has increased, continue IV Lasix 4. Hypokalemia- supplement per protocol 5. GU- leave foley in for accurate measurements of I/O with aggressive diuretics 6. CBGs controlled, patient is not a diabetic will d/c SSIP 7. Dispo- patient with rate controlled  A. Fib, continue IV Amiodarone today, increase BB, continue aggressive diuretics, supplement K, stop SSIP, can likely start to wean Dopamine as creatinine is improved and U/O has improved,      Anaja Monts 06/10/2018 8:04 AM

## 2018-06-10 NOTE — Care Management Note (Signed)
Case Management Note Marvetta Gibbons RN,BSN Unit [facility]22 Case Manager  [phone]  Patient Details  Name: Allen James MRN: [id number] Date of Birth: 1941-07-27  Subjective/Objective:     Pt admitted s/p CABG x3               Action/Plan: PTA pt lived at home with spouse, per PT/OT evals recommendations for [facility] services- will need HH orders with F2F for HHPT/OT and DME RW and 3n1 prior to discharge CM will follow for transition of care needs  Expected Discharge Date:                  Expected Discharge Plan:  [place]  In-House Referral:     Discharge planning Services  CM Consult  Post Acute Care Choice:  Durable Medical Equipment, Home Health Choice offered to:     DME Arranged:    DME Agency:     HH Arranged:    [place] Agency:     Status of Service:  In process, will continue to follow  If discussed at Long Length of Stay Meetings, dates discussed:    Discharge Disposition:   Additional Comments:  Dawayne Patricia, RN 06/10/2018, 10:55 AM

## 2018-06-11 ENCOUNTER — Inpatient Hospital Stay (HOSPITAL_COMMUNITY): Payer: Medicare HMO

## 2018-06-11 LAB — CBC
HCT: 33 % — ABNORMAL LOW (ref 39.0–52.0)
Hemoglobin: 10.9 g/dL — ABNORMAL LOW (ref 13.0–17.0)
MCH: 31.8 pg (ref 26.0–34.0)
MCHC: 33 g/dL (ref 30.0–36.0)
MCV: 96.2 fL (ref 78.0–100.0)
Platelets: 166 10*3/uL (ref 150–400)
RBC: 3.43 MIL/uL — ABNORMAL LOW (ref 4.22–5.81)
RDW: 12.5 % (ref 11.5–15.5)
WBC: 7.7 10*3/uL (ref 4.0–10.5)

## 2018-06-11 LAB — POCT I-STAT, CHEM 8
BUN: 26 mg/dL — ABNORMAL HIGH (ref 8–23)
Calcium, Ion: 1.11 mmol/L — ABNORMAL LOW (ref 1.15–1.40)
Chloride: 92 mmol/L — ABNORMAL LOW (ref 98–111)
Creatinine, Ser: 1 mg/dL (ref 0.61–1.24)
Glucose, Bld: 146 mg/dL — ABNORMAL HIGH (ref 70–99)
HCT: 30 % — ABNORMAL LOW (ref 39.0–52.0)
Hemoglobin: 10.2 g/dL — ABNORMAL LOW (ref 13.0–17.0)
Potassium: 3.2 mmol/L — ABNORMAL LOW (ref 3.5–5.1)
Sodium: 134 mmol/L — ABNORMAL LOW (ref 135–145)
TCO2: 29 mmol/L (ref 22–32)

## 2018-06-11 LAB — COOXEMETRY PANEL
Carboxyhemoglobin: 1.2 % (ref 0.5–1.5)
Methemoglobin: 1.3 % (ref 0.0–1.5)
O2 Saturation: 59.2 %
Total hemoglobin: 11.4 g/dL — ABNORMAL LOW (ref 12.0–16.0)

## 2018-06-11 LAB — BASIC METABOLIC PANEL
Anion gap: 10 (ref 5–15)
BUN: 26 mg/dL — ABNORMAL HIGH (ref 8–23)
CO2: 30 mmol/L (ref 22–32)
Calcium: 8.4 mg/dL — ABNORMAL LOW (ref 8.9–10.3)
Chloride: 95 mmol/L — ABNORMAL LOW (ref 98–111)
Creatinine, Ser: 1.16 mg/dL (ref 0.61–1.24)
GFR calc Af Amer: >60 mL/min (ref >60)
GFR calc non Af Amer: 59 mL/min — ABNORMAL LOW (ref >60)
Glucose, Bld: 155 mg/dL — ABNORMAL HIGH (ref 70–99)
Potassium: 3.2 mmol/L — ABNORMAL LOW (ref 3.5–5.1)
Sodium: 135 mmol/L (ref 135–145)

## 2018-06-11 LAB — TSH: TSH: 4.936 u[IU]/mL — ABNORMAL HIGH (ref 0.350–4.500)

## 2018-06-11 LAB — MAGNESIUM: Magnesium: 2.1 mg/dL (ref 1.7–2.4)

## 2018-06-11 MED ORDER — ATORVASTATIN CALCIUM 40 MG PO TABS
40.0000 mg | ORAL_TABLET | Freq: Every day | ORAL | Status: DC
  Administered 2018-06-11 – 2018-06-13 (×3): 40 mg via ORAL
  Filled 2018-06-11 (×3): qty 1

## 2018-06-11 MED ORDER — FUROSEMIDE 40 MG PO TABS: 40.0000 mg | ORAL_TABLET | Freq: Every day | ORAL | Status: DC

## 2018-06-11 MED ORDER — FUROSEMIDE 10 MG/ML IJ SOLN
40.0000 mg | Freq: Every day | INTRAMUSCULAR | Status: AC
  Administered 2018-06-12: 40 mg via INTRAVENOUS
  Filled 2018-06-11: qty 4

## 2018-06-11 MED ORDER — FUROSEMIDE 10 MG/ML IJ SOLN
40.0000 mg | Freq: Every day | INTRAMUSCULAR | Status: DC
  Administered 2018-06-11: 40 mg via INTRAVENOUS
  Filled 2018-06-11: qty 4

## 2018-06-11 MED ORDER — AMIODARONE HCL 200 MG PO TABS
400.0000 mg | ORAL_TABLET | Freq: Two times a day (BID) | ORAL | Status: DC
  Administered 2018-06-11 – 2018-06-14 (×7): 400 mg via ORAL
  Filled 2018-06-11 (×7): qty 2

## 2018-06-11 MED ORDER — POTASSIUM CHLORIDE 10 MEQ/50ML IV SOLN
10.0000 meq | INTRAVENOUS | Status: AC
  Administered 2018-06-11 (×4): 10 meq via INTRAVENOUS
  Filled 2018-06-11 (×4): qty 50

## 2018-06-11 MED ORDER — METOLAZONE 5 MG PO TABS
5.0000 mg | ORAL_TABLET | Freq: Every day | ORAL | Status: AC
  Administered 2018-06-12: 5 mg via ORAL
  Filled 2018-06-11: qty 1

## 2018-06-11 MED ORDER — ENOXAPARIN SODIUM 40 MG/0.4ML ~~LOC~~ SOLN
40.0000 mg | SUBCUTANEOUS | Status: DC
  Administered 2018-06-11: 40 mg via SUBCUTANEOUS
  Filled 2018-06-11: qty 0.4

## 2018-06-11 MED ORDER — POTASSIUM CHLORIDE 10 MEQ/50ML IV SOLN
10.0000 meq | INTRAVENOUS | Status: AC
  Administered 2018-06-11 (×2): 10 meq via INTRAVENOUS
  Filled 2018-06-11: qty 50

## 2018-06-11 MED ORDER — POTASSIUM CHLORIDE CRYS ER 20 MEQ PO TBCR
20.0000 meq | EXTENDED_RELEASE_TABLET | Freq: Two times a day (BID) | ORAL | Status: DC
  Administered 2018-06-11: 20 meq via ORAL
  Filled 2018-06-11: qty 1

## 2018-06-11 MED FILL — Potassium Chloride Inj 2 mEq/ML: INTRAVENOUS | Qty: 40 | Status: AC

## 2018-06-11 MED FILL — Magnesium Sulfate Inj 50%: INTRAMUSCULAR | Qty: 10 | Status: AC

## 2018-06-11 MED FILL — Heparin Sodium (Porcine) Inj 1000 Unit/ML: INTRAMUSCULAR | Qty: 30 | Status: AC

## 2018-06-11 NOTE — Progress Notes (Signed)
Physical Therapy Treatment Patient Details Name: Allen James MRN: [id number] DOB: [birth year] Today's Date: 06/11/2018    History of Present Illness 77 yo admitted with NSTEMI s/p CABG x 3. PMHx: CAD, OSA, obesity    PT Comments    Patient progressing well with therapy this visit. Utilizing RW to ambulate unit without any rest breaks, now on RA, SpO2 in mid to high 90s with activity. HR ranged 85-155max this session. Pt min guard to close stand by assist for all mobility, next PT session to focus on stair training for home entry.    Follow Up Recommendations  Home health PT     Equipment Recommendations  Rolling walker with 5" wheels    Recommendations for Other Services       Precautions / Restrictions Precautions Precautions: Sternal Precaution Booklet Issued: Yes (comment) Precaution Comments: Reviewed sternal precautions and adherance during ADLs Restrictions Weight Bearing Restrictions: Yes    Mobility  Bed Mobility               General bed mobility comments: OOB at entry  Transfers Overall transfer level: Needs assistance Equipment used: Rolling walker (2 wheeled) Transfers: Sit to/from Stand Sit to Stand: Min guard         General transfer comment: cues for hand placement  Ambulation/Gait Ambulation/Gait assistance: Min guard;Supervision Gait Distance (Feet): 400 Feet Assistive device: Rolling walker (2 wheeled) Gait Pattern/deviations: Step-through pattern;Decreased stride length Gait velocity: decreased   General Gait Details: pt ambulating unit on RA, satting well, HRmax 100. Pt with RW and cues for posture and adherence to sternal precautions.    Stairs             Wheelchair Mobility    Modified Rankin (Stroke Patients Only)       Balance Overall balance assessment: Needs assistance Sitting-balance support: Feet supported;No upper extremity supported Sitting balance-Leahy Scale: Good     Standing balance support:  During functional activity;No upper extremity supported Standing balance-Leahy Scale: Fair                              Cognition Arousal/Alertness: Awake/alert Behavior During Therapy: WFL for tasks assessed/performed Overall Cognitive Status: Within Functional Limits for tasks assessed                                        Exercises      General Comments        Pertinent Vitals/Pain Pain Assessment: Faces Faces Pain Scale: No hurt Pain Intervention(s): Monitored during session    Home Living                      Prior Function            PT Goals (current goals can now be found in the care plan section) Acute Rehab PT Goals Patient Stated Goal: return to golf PT Goal Formulation: With patient Time For Goal Achievement: 06/23/18 Potential to Achieve Goals: Good Progress towards PT goals: Progressing toward goals    Frequency    Min 3X/week      PT Plan Current plan remains appropriate    Co-evaluation              AM-PAC PT "6 Clicks" Daily Activity  Outcome Measure  Difficulty turning over in bed (including adjusting bedclothes, sheets and  blankets)?: Unable Difficulty moving from lying on back to sitting on the side of the bed? : Unable Difficulty sitting down on and standing up from a chair with arms (e.g., wheelchair, bedside commode, etc,.)?: A Little Help needed moving to and from a bed to chair (including a wheelchair)?: A Little Help needed walking in hospital room?: A Little Help needed climbing 3-5 steps with a railing? : A Lot 6 Click Score: 13    End of Session Equipment Utilized During Treatment: Gait belt Activity Tolerance: Patient tolerated treatment well Patient left: in chair;with call bell/phone within reach;with nursing/sitter in room Nurse Communication: Mobility status PT Visit Diagnosis: Other abnormalities of gait and mobility (R26.89);Muscle weakness (generalized) (M62.81)      Time: 0830-0900 PT Time Calculation (min) (ACUTE ONLY): 30 min  Charges:  $Gait Training: 23-37 mins                    G Codes:      Reinaldo Berber, PT, DPT Acute Rehab Services Pager: [phone]      Reinaldo Berber 06/11/2018, 9:11 AM

## 2018-06-11 NOTE — Progress Notes (Signed)
Pt received from [place]. VSS. Telemetry applied. Dinner tray rerouted. PT and family oriented to room and unit. Will continue to monitor.  Clyde Canterbury, RN

## 2018-06-11 NOTE — Plan of Care (Signed)
Patient denies any discomfort at this time.  Wife at the bedside, supportive.  Discussed changes regarding medications and increasing activity.  Monitoring continues.

## 2018-06-11 NOTE — Progress Notes (Signed)
Occupational Therapy Treatment Patient Details Name: Allen James MRN: [id number] DOB: [birth year] Today's Date: 06/11/2018    History of present illness 77 yo admitted with NSTEMI s/p CABG x 3. PMHx: CAD, OSA, obesity   OT comments  Pt progressing towards established OT goals. Providing pt with education and handout on sternal precautions and reviewing in full. Educating pt on energy conservation and the four "Ps"; pt verbalized understanding. Pt donning/doffing socks at EOB by bringing ankles to knees. Pt requesting for OT to finish education on dressing once wife has return with clothing. Will continue to follow acutely as admitted and continues to recommend dc home with [place].    Follow Up Recommendations  Home health OT;Supervision/Assistance - 24 hour    Equipment Recommendations  3 in 1 bedside commode    Recommendations for Other Services PT consult    Precautions / Restrictions Precautions Precautions: Sternal Precaution Booklet Issued: Yes (comment) Precaution Comments: Reviewed sternal precautions and adherance during ADLs Restrictions Weight Bearing Restrictions: (Sternal precautions)       Mobility Bed Mobility Overal bed mobility: Needs Assistance Bed Mobility: Rolling;Sidelying to Sit Rolling: Supervision Sidelying to sit: Supervision       General bed mobility comments: supervision for safety  Transfers Overall transfer level: Needs assistance Equipment used: Rolling walker (2 wheeled) Transfers: Sit to/from Stand Sit to Stand: Min guard         General transfer comment: Demonstrating good hand placement and wieght shifting    Balance Overall balance assessment: Needs assistance Sitting-balance support: Feet supported;No upper extremity supported Sitting balance-Leahy Scale: Good     Standing balance support: During functional activity;No upper extremity supported Standing balance-Leahy Scale: Fair                              ADL either performed or assessed with clinical judgement   ADL Overall ADL's : Needs assistance/impaired                     Lower Body Dressing: Min guard;Sit to/from stand Lower Body Dressing Details (indicate cue type and reason): Pt able to bring ankles to knees for donning socks. Pt requesting to wait till wife arrives with his clothes to finish education on dressing Toilet Transfer: Min guard;Ambulation(Simulated to recliner) [employer] forces technical [employer] Details (indicate cue type and reason): Pt demonstrating understanding of sternal precautions during functional transfers         Functional mobility during ADLs: Min guard General ADL Comments: Pt continues to be motivated to participate in therapy and return to PLOF. Reviewed all sternal precautions and handout as well as four "Ps" of energy conservation     Vision       Perception     Praxis      Cognition Arousal/Alertness: Awake/alert Behavior During Therapy: WFL for tasks assessed/performed Overall Cognitive Status: Within Functional Limits for tasks assessed                                          Exercises     Shoulder Instructions       General Comments      Pertinent Vitals/ Pain       Pain Assessment: Faces Faces Pain Scale: No hurt Pain Intervention(s): Monitored during session;Limited activity within patient's tolerance;Repositioned  Home Living  Prior Functioning/Environment              Frequency  Min 3X/week        Progress Toward Goals  OT Goals(current goals can now be found in the care plan section)  Progress towards OT goals: Progressing toward goals  Acute Rehab OT Goals Patient Stated Goal: return to golf OT Goal Formulation: With patient Time For Goal Achievement: 06/24/18 Potential to Achieve Goals: Good ADL Goals Pt Will Perform Grooming: with set-up;with supervision;standing Pt Will  Perform Upper Body Dressing: with set-up;with supervision;sitting Pt Will Perform Lower Body Dressing: with set-up;with supervision;sit to/from stand(with or without AE) Pt Will Transfer to Toilet: with set-up;with supervision;ambulating;bedside commode Pt Will Perform Toileting - Clothing Manipulation and hygiene: with set-up;with supervision;sit to/from stand  Plan Discharge plan remains appropriate    Co-evaluation                 AM-PAC PT "6 Clicks" Daily Activity     Outcome Measure   Help from another person eating meals?: None Help from another person taking care of personal grooming?: None Help from another person toileting, which includes using toliet, bedpan, or urinal?: A Lot Help from another person bathing (including washing, rinsing, drying)?: A Little Help from another person to put on and taking off regular upper body clothing?: A Little Help from another person to put on and taking off regular lower body clothing?: A Little 6 Click Score: 19    End of Session    OT Visit Diagnosis: Unsteadiness on feet (R26.81);Other abnormalities of gait and mobility (R26.89);Muscle weakness (generalized) (M62.81);Pain Pain - part of body: (Chest)   Activity Tolerance Patient tolerated treatment well   Patient Left in chair;with call bell/phone within reach   Nurse Communication Mobility status;Precautions        Time: 8299-3716 OT Time Calculation (min): 8 min  Charges: OT General Charges $OT Visit: 1 Visit OT Treatments $Self Care/Home Management : 8-22 mins  [place], OTR/L Acute Rehab Pager: [phone] Office: [place] 06/11/2018, 4:47 PM

## 2018-06-11 NOTE — Progress Notes (Signed)
5 Days Post-Op Procedure(s) (LRB): CORONARY ARTERY BYPASS GRAFTING (CABG) x 3 WITH ENDOSCOPIC HARVESTING OF RIGHT SAPHENOUS VEIN (N/A) TRANSESOPHAGEAL ECHOCARDIOGRAM (TEE) (N/A) Subjective: Now on room air nsr Ambulating Transfer to 4 E  Objective: Vital signs in last 24 hours: Temp:  [97.8 F (36.6 C)-99 F (37.2 C)] 97.9 F (36.6 C) (07/17 0839) Pulse Rate:  [65-96] 73 (07/17 0600) Cardiac Rhythm: Atrial fibrillation;Normal sinus rhythm (07/16 2000) Resp:  [16-35] 27 (07/17 0600) BP: (100-152)/(50-101) 143/75 (07/17 0600) SpO2:  [90 %-100 %] 96 % (07/17 0600) Weight:  [256 lb 6.3 oz (116.3 kg)] 256 lb 6.3 oz (116.3 kg) (07/17 0500)  Hemodynamic parameters for last 24 hours:  stable  Intake/Output from previous day: 07/16 0701 - 07/17 0700 In: 1524.3 [P.O.:480; I.V.:544; IV Piggyback:500.4] Out: 4475 [Urine:4475] Intake/Output this shift: No intake/output data recorded.       Exam    General- alert and comfortable    Neck- no JVD, no cervical adenopathy palpable, no carotid bruit   Lungs- clear without rales, wheezes   Cor- regular rate and rhythm, no murmur , gallop   Abdomen- soft, non-tender   Extremities - warm, non-tender, minimal edema   Neuro- oriented, appropriate, no focal weakness   Lab Results: Recent Labs    06/10/18 0423 06/11/18 0535 06/11/18 0552  WBC 7.4 7.7  --   HGB 11.2* 10.9* 10.2*  HCT 33.9* 33.0* 30.0*  PLT 144* 166  --    BMET:  Recent Labs    06/10/18 1827 06/11/18 0535 06/11/18 0552  NA 134* 135 134*  K 2.9* 3.2* 3.2*  CL 94* 95* 92*  CO2 27 30  --   GLUCOSE 181* 155* 146*  BUN 31* 26* 26*  CREATININE 1.26* 1.16 1.00  CALCIUM 8.2* 8.4*  --     PT/INR: No results for input(s): LABPROT, INR in the last 72 hours. ABG    Component Value Date/Time   PHART 7.354 06/09/2018 0821   HCO3 24.4 06/09/2018 0821   TCO2 29 06/11/2018 0552   ACIDBASEDEF 1.0 06/09/2018 0821   O2SAT 59.2 06/11/2018 0550   CBG (last 3)  Recent  Labs    06/09/18 2348 06/10/18 0426 06/10/18 0733  GLUCAP 103* 116* 113*    Assessment/Plan: S/P Procedure(s) (LRB): CORONARY ARTERY BYPASS GRAFTING (CABG) x 3 WITH ENDOSCOPIC HARVESTING OF RIGHT SAPHENOUS VEIN (N/A) TRANSESOPHAGEAL ECHOCARDIOGRAM (TEE) (N/A) Mobilize Diuresis Plan for transfer to step-down: see transfer orders   LOS: 5 days    Allen James 06/11/2018

## 2018-06-11 NOTE — Progress Notes (Signed)
CARDIAC REHAB PHASE I   Offered to walk with pt. RN states pt just transferred to 4E and has walked already. Pt in bed resting. Will continue to follow.  [id number] Rufina Falco, RN BSN 06/11/2018 2:46 PM

## 2018-06-12 ENCOUNTER — Inpatient Hospital Stay (HOSPITAL_COMMUNITY): Payer: Medicare HMO

## 2018-06-12 LAB — BASIC METABOLIC PANEL
Anion gap: 11 (ref 5–15)
BUN: 24 mg/dL — ABNORMAL HIGH (ref 8–23)
CO2: 29 mmol/L (ref 22–32)
Calcium: 8.6 mg/dL — ABNORMAL LOW (ref 8.9–10.3)
Chloride: 95 mmol/L — ABNORMAL LOW (ref 98–111)
Creatinine, Ser: 1.11 mg/dL (ref 0.61–1.24)
GFR calc Af Amer: >60 mL/min (ref >60)
GFR calc non Af Amer: >60 mL/min (ref >60)
Glucose, Bld: 119 mg/dL — ABNORMAL HIGH (ref 70–99)
Potassium: 3 mmol/L — ABNORMAL LOW (ref 3.5–5.1)
Sodium: 135 mmol/L (ref 135–145)

## 2018-06-12 LAB — CBC
HCT: 33.6 % — ABNORMAL LOW (ref 39.0–52.0)
Hemoglobin: 11.1 g/dL — ABNORMAL LOW (ref 13.0–17.0)
MCH: 31.8 pg (ref 26.0–34.0)
MCHC: 33 g/dL (ref 30.0–36.0)
MCV: 96.3 fL (ref 78.0–100.0)
Platelets: 204 10*3/uL (ref 150–400)
RBC: 3.49 MIL/uL — ABNORMAL LOW (ref 4.22–5.81)
RDW: 12.5 % (ref 11.5–15.5)
WBC: 7.9 10*3/uL (ref 4.0–10.5)

## 2018-06-12 MED ORDER — ENOXAPARIN SODIUM 40 MG/0.4ML ~~LOC~~ SOLN
40.0000 mg | SUBCUTANEOUS | Status: DC
  Administered 2018-06-12 – 2018-06-13 (×2): 40 mg via SUBCUTANEOUS
  Filled 2018-06-12 (×2): qty 0.4

## 2018-06-12 MED ORDER — POTASSIUM CHLORIDE CRYS ER 20 MEQ PO TBCR
30.0000 meq | EXTENDED_RELEASE_TABLET | Freq: Two times a day (BID) | ORAL | Status: DC
  Administered 2018-06-12 – 2018-06-13 (×3): 30 meq via ORAL
  Filled 2018-06-12 (×3): qty 1

## 2018-06-12 MED ORDER — FUROSEMIDE 40 MG PO TABS
40.0000 mg | ORAL_TABLET | Freq: Every day | ORAL | Status: DC
  Administered 2018-06-13 – 2018-06-14 (×2): 40 mg via ORAL
  Filled 2018-06-12 (×2): qty 1

## 2018-06-12 MED ORDER — POTASSIUM CHLORIDE CRYS ER 20 MEQ PO TBCR
40.0000 meq | EXTENDED_RELEASE_TABLET | Freq: Once | ORAL | Status: AC
  Administered 2018-06-12: 40 meq via ORAL
  Filled 2018-06-12: qty 2

## 2018-06-12 NOTE — Care Management Important Message (Signed)
Important Message  Patient Details  Name: Allen James MRN: [id number] Date of Birth: November 10, 1941   Medicare Important Message Given:  Yes    [place] 06/12/2018, 1:19 PM

## 2018-06-12 NOTE — Progress Notes (Signed)
Pt is currently on NIV at this time. Pt states that the previous pressure from last night was too high. Pressure dropped from [place] to [place]. Pt states that it is much more comfortable now and tolerable. No distress or complications noted. Good chest excursion noted on inspection assessment.

## 2018-06-12 NOTE — Progress Notes (Signed)
CARDIAC REHAB PHASE I   PRE:  Rate/Rhythm: 87 SR  BP:  Sitting: 125/59      SaO2: 96 RA  MODE:  Ambulation: 270 ft 102 peak HR  POST:  Rate/Rhythm: 94 SR  BP:  Sitting: 142/61    SaO2: 96 RA   Pt ambulated 253ft in hallway standby assist with front wheel walker. Pt had slight SOB while ambulating, one standing rest break, and pt coached through pursed lip breathing. Pt returned to recliner with call bell and bedside table within reach. Reviewed sternal precautions with the pt, along with importance of IS and flutter use. Pt states his wife will be here tomorrow, will complete education then.  [id number] Rufina Falco, RN BSN 06/12/2018 11:27 AM

## 2018-06-12 NOTE — Plan of Care (Signed)
Care plans reviewed and patient is progressing.  

## 2018-06-12 NOTE — Progress Notes (Signed)
Physical Therapy Treatment Patient Details Name: Allen James MRN: [id number] DOB: 1941-07-17 Today's Date: 06/12/2018    History of Present Illness 77 yo admitted with NSTEMI s/p CABG x 3. PMHx: CAD, OSA, obesity    PT Comments    Pt continues to make good progress. Pt more efficient with rollator vs rolling walker. Recommend rollator for home.   Follow Up Recommendations  Home health PT     Equipment Recommendations  Other (comment)(rollator)    Recommendations for Other Services       Precautions / Restrictions Precautions Precautions: Sternal Precaution Comments: Reviewed sternal precautions and adherance during ADLs Restrictions Weight Bearing Restrictions: Yes    Mobility  Bed Mobility               General bed mobility comments: Pt up in chair  Transfers Overall transfer level: Needs assistance Equipment used: 4-wheeled walker;None Transfers: Sit to/from Stand Sit to Stand: Min guard         General transfer comment: cues for hand placement when returning to sitting  Ambulation/Gait Ambulation/Gait assistance: Min guard;Supervision Gait Distance (Feet): 300 Feet Assistive device: 4-wheeled walker;None Gait Pattern/deviations: Step-through pattern;Decreased stride length Gait velocity: decreased Gait velocity interpretation: >2.62 ft/sec, indicative of community ambulatory General Gait Details: Amb with supervision with rollator. Amb in from chair to door in room without assistive device with min guard   Stairs Stairs: Yes Stairs assistance: Min assist Stair Management: One rail Left;Step to pattern;Forwards Number of Stairs: 1 General stair comments: Took 1 stair to room and pt used sink counter as simulated rail.    Wheelchair Mobility    Modified Rankin (Stroke Patients Only)       Balance Overall balance assessment: Needs assistance Sitting-balance support: Feet supported;No upper extremity supported Sitting balance-Leahy  Scale: Good     Standing balance support: During functional activity;No upper extremity supported Standing balance-Leahy Scale: Fair                              Cognition Arousal/Alertness: Awake/alert Behavior During Therapy: WFL for tasks assessed/performed Overall Cognitive Status: Within Functional Limits for tasks assessed                                        Exercises      General Comments        Pertinent Vitals/Pain Pain Assessment: Faces Faces Pain Scale: No hurt    Home Living                      Prior Function            PT Goals (current goals can now be found in the care plan section) Acute Rehab PT Goals Patient Stated Goal: return to golf Progress towards PT goals: Progressing toward goals    Frequency    Min 3X/week      PT Plan Current plan remains appropriate    Co-evaluation              AM-PAC PT "6 Clicks" Daily Activity  Outcome Measure  Difficulty turning over in bed (including adjusting bedclothes, sheets and blankets)?: Unable Difficulty moving from lying on back to sitting on the side of the bed? : Unable Difficulty sitting down on and standing up from a chair with arms (e.g., wheelchair, bedside commode, etc,.)?: A  Little Help needed moving to and from a bed to chair (including a wheelchair)?: A Little Help needed walking in hospital room?: A Little Help needed climbing 3-5 steps with a railing? : A Little 6 Click Score: 14    End of Session Equipment Utilized During Treatment: Gait belt Activity Tolerance: Patient tolerated treatment well Patient left: in chair;with call bell/phone within reach;with family/visitor present Nurse Communication: Mobility status PT Visit Diagnosis: Other abnormalities of gait and mobility (R26.89);Muscle weakness (generalized) (M62.81)     Time: 5670-1410 PT Time Calculation (min) (ACUTE ONLY): 20 min  Charges:  $Gait Training: 8-22 mins                     G Codes:       [facility] PT [place] 06/12/2018, 4:51 PM

## 2018-06-12 NOTE — Progress Notes (Addendum)
      [address]       [place] [address]             [phone]        6 Days Post-Op Procedure(s) (LRB): CORONARY ARTERY BYPASS GRAFTING (CABG) x 3 WITH ENDOSCOPIC HARVESTING OF RIGHT SAPHENOUS VEIN (N/A) TRANSESOPHAGEAL ECHOCARDIOGRAM (TEE) (N/A)  Subjective: Patient had a bowel movement this am.  Objective: Vital signs in last 24 hours: Temp:  [97.5 F (36.4 C)-99.3 F (37.4 C)] 99.3 F (37.4 C) (07/18 0357) Pulse Rate:  [71-80] 76 (07/17 2310) Cardiac Rhythm: Normal sinus rhythm (07/18 0700) Resp:  [18-29] 18 (07/18 0357) BP: (105-143)/(54-68) 129/68 (07/18 0357) SpO2:  [94 %-99 %] 95 % (07/17 2310) Weight:  [253 lb 6.4 oz (114.9 kg)] 253 lb 6.4 oz (114.9 kg) (07/18 0357)  Pre op weight 122.7 kg Current Weight  06/12/18 253 lb 6.4 oz (114.9 kg)       Intake/Output from previous day: 07/17 0701 - 07/18 0700 In: 692.5 [P.O.:240; I.V.:111.7; IV Piggyback:340.8] Out: 1140 [Urine:1140]   Physical Exam:  Cardiovascular: RRR Pulmonary: Diminished at bases Abdomen: Soft, non tender, bowel sounds present. Extremities: Bilateral lower extremity edema. Echymosis right thigh Wounds: Sternal dressing partially removed and wound is clean and dry.  No erythema or signs of infection. RLE wound is clean and dry.  Lab Results: CBC: Recent Labs    06/11/18 0535 06/11/18 0552 06/12/18 0500  WBC 7.7  --  7.9  HGB 10.9* 10.2* 11.1*  HCT 33.0* 30.0* 33.6*  PLT 166  --  204   BMET:  Recent Labs    06/11/18 0535 06/11/18 0552 06/12/18 0500  NA 135 134* 135  K 3.2* 3.2* 3.0*  CL 95* 92* 95*  CO2 30  --  29  GLUCOSE 155* 146* 119*  BUN 26* 26* 24*  CREATININE 1.16 1.00 1.11  CALCIUM 8.4*  --  8.6*    PT/INR:  Lab Results  Component Value Date   INR 1.21 06/06/2018   INR 0.99 06/05/2018   INR 1.00 04/14/2014   ABG:  INR: Will add last result for INR, ABG once components are confirmed Will add last 4 CBG results once components are  confirmed  Assessment/Plan:  1. CV - Previous a fib. SR in the 90's this am. On Amiodarone 400 mg bid and Lopressor 25 mg bid 2.  Pulmonary - On room air. Encourage incentive spirometer. CXR this am appears stable (no pneumothorax, small pleural effusions). Encourage incentive spirometer. 3. Volume Overload - On Metolazone 5 mg daily and Lasix daily 4.  Acute blood loss anemia - H and H stable at 11.1 and 33.6 5. Supplement potassium 6. Remove EPW 7. Will arrange for home PT, OT. Likely discharge Saturday  Sharalyn Ink ZimmermanPA-C 06/12/2018,9:20 AM [phone] Serosanguineous drainage from upper sternal incision has stopped.  Chest x-ray shows significantly improved aeration without pleural effusions  Pacing wires personally pulled today  Face-to-face home health nurse documentation completed Stop metolazone continue Lasix p.o. Plan Home in 1 to 2 days  patient examined and medical record reviewed,agree with above note. Tharon Aquas Trigt III 06/12/2018

## 2018-06-13 LAB — CBC
HCT: 34.5 % — ABNORMAL LOW (ref 39.0–52.0)
Hemoglobin: 11.5 g/dL — ABNORMAL LOW (ref 13.0–17.0)
MCH: 32.1 pg (ref 26.0–34.0)
MCHC: 33.3 g/dL (ref 30.0–36.0)
MCV: 96.4 fL (ref 78.0–100.0)
Platelets: 290 10*3/uL (ref 150–400)
RBC: 3.58 MIL/uL — ABNORMAL LOW (ref 4.22–5.81)
RDW: 12.7 % (ref 11.5–15.5)
WBC: 9 10*3/uL (ref 4.0–10.5)

## 2018-06-13 LAB — BASIC METABOLIC PANEL
Anion gap: 14 (ref 5–15)
BUN: 28 mg/dL — ABNORMAL HIGH (ref 8–23)
CO2: 27 mmol/L (ref 22–32)
Calcium: 8.9 mg/dL (ref 8.9–10.3)
Chloride: 93 mmol/L — ABNORMAL LOW (ref 98–111)
Creatinine, Ser: 1.17 mg/dL (ref 0.61–1.24)
GFR calc Af Amer: >60 mL/min (ref >60)
GFR calc non Af Amer: 59 mL/min — ABNORMAL LOW (ref >60)
Glucose, Bld: 120 mg/dL — ABNORMAL HIGH (ref 70–99)
Potassium: 2.9 mmol/L — ABNORMAL LOW (ref 3.5–5.1)
Sodium: 134 mmol/L — ABNORMAL LOW (ref 135–145)

## 2018-06-13 MED ORDER — POTASSIUM CHLORIDE CRYS ER 20 MEQ PO TBCR
40.0000 meq | EXTENDED_RELEASE_TABLET | Freq: Two times a day (BID) | ORAL | Status: DC
  Administered 2018-06-13 – 2018-06-14 (×2): 40 meq via ORAL
  Filled 2018-06-13 (×2): qty 2

## 2018-06-13 MED ORDER — POTASSIUM CHLORIDE CRYS ER 20 MEQ PO TBCR
40.0000 meq | EXTENDED_RELEASE_TABLET | Freq: Once | ORAL | Status: AC
  Administered 2018-06-13: 40 meq via ORAL
  Filled 2018-06-13: qty 2

## 2018-06-13 NOTE — Progress Notes (Signed)
   Patient was placed on CPAP of 4 cmH2O at his request and patient is tolerating at this time.

## 2018-06-13 NOTE — Progress Notes (Signed)
OT Cancellation Note  Patient Details Name: Allen James MRN: [id number] DOB: [birth year]  Pt declined OT and all OOB mobility d/t fatigue from recent ambulation with cardiac rehab. OT will continue to f/u as indicated.   Cancelled Treatment:    Reason Eval/Treat Not Completed: Fatigue/lethargy limiting ability to participate  Curtis Sites 06/13/2018, 1:58 PM

## 2018-06-13 NOTE — Progress Notes (Signed)
      [address]       [place] [address]             [phone]      7 Days Post-Op Procedure(s) (LRB): CORONARY ARTERY BYPASS GRAFTING (CABG) x 3 WITH ENDOSCOPIC HARVESTING OF RIGHT SAPHENOUS VEIN (N/A) TRANSESOPHAGEAL ECHOCARDIOGRAM (TEE) (N/A)   Subjective:  No new complaints.  Looks and feels great.  + ambulation  + BM  Objective: Vital signs in last 24 hours: Temp:  [98 F (36.7 C)-98.6 F (37 C)] 98 F (36.7 C) (07/19 0402) Pulse Rate:  [85-89] 85 (07/19 0402) Cardiac Rhythm: Normal sinus rhythm (07/18 2024) Resp:  [20-28] 26 (07/19 0402) BP: (110-142)/(54-69) 110/64 (07/19 0402) SpO2:  [92 %-98 %] 98 % (07/19 0402) Weight:  [252 lb 4.8 oz (114.4 kg)] 252 lb 4.8 oz (114.4 kg) (07/19 0402)  Intake/Output from previous day: 07/18 0701 - 07/19 0700 In: 360 [P.O.:360] Out: -   General appearance: alert, cooperative and no distress Heart: regular rate and rhythm Lungs: clear to auscultation bilaterally Abdomen: soft, non-tender; bowel sounds normal; no masses,  no organomegaly Extremities: edema trace Wound: clean and dry  Lab Results: Recent Labs    06/12/18 0500 06/13/18 0659  WBC 7.9 9.0  HGB 11.1* 11.5*  HCT 33.6* 34.5*  PLT 204 290   BMET:  Recent Labs    06/12/18 0500 06/13/18 0659  NA 135 134*  K 3.0* 2.9*  CL 95* 93*  CO2 29 27  GLUCOSE 119* 120*  BUN 24* 28*  CREATININE 1.11 1.17  CALCIUM 8.6* 8.9    PT/INR: No results for input(s): LABPROT, INR in the last 72 hours. ABG    Component Value Date/Time   PHART 7.354 06/09/2018 0821   HCO3 24.4 06/09/2018 0821   TCO2 29 06/11/2018 0552   ACIDBASEDEF 1.0 06/09/2018 0821   O2SAT 59.2 06/11/2018 0550   CBG (last 3)  No results for input(s): GLUCAP in the last 72 hours.  Assessment/Plan: S/P Procedure(s) (LRB): CORONARY ARTERY BYPASS GRAFTING (CABG) x 3 WITH ENDOSCOPIC HARVESTING OF RIGHT SAPHENOUS VEIN (N/A) TRANSESOPHAGEAL ECHOCARDIOGRAM (TEE) (N/A)  1. CV-  NSR, BP controlled- continue Lopressor, Amiodarone 2. Pulm- no acute issues, continue IS 3. Renal- creatinine WNL, weight is trending down, remains on Lasix 4. Hypokalemia- down to 2.9, despite aggressive supplementation, likely due to aggressive diuretics, however will discuss with Dr. Prescott Gum if further workup up is needed for underlying adrenal issues 5. DM- sugars controlled 6. Dispo- patient stable, need to correct potassium level prior to discharge, continue supplementation, remains clinically stable in NSR   LOS: 7 days    Allen James 06/13/2018

## 2018-06-13 NOTE — Progress Notes (Signed)
CARDIAC REHAB PHASE I   PRE:  Rate/Rhythm: 92 SR  BP:  Sitting: 127/84      SaO2: 97 RA  MODE:  Ambulation: 400 ft 102 peak HR  POST:  Rate/Rhythm: 91 SR  BP:  Sitting: 136/59    SaO2: 96 RA   Pt ambulated 462ft in hallway standby assist with front wheel rolling walker. Pt states he feels stronger today. Pt and wife educated on sternal precautions. Pt educated to shower daily and monitor incisions. Reviewed importance of keeping a routine and continue with exercise guidelines. Pt and wife given heart healthy diet and cardiac surgery booklet. Reviewed restrictions. Will refer to CRP II [place].  [id number] Rufina Falco, RN BSN 06/13/2018 11:31 AM

## 2018-06-14 LAB — BASIC METABOLIC PANEL
Anion gap: 11 (ref 5–15)
BUN: 25 mg/dL — ABNORMAL HIGH (ref 8–23)
CO2: 28 mmol/L (ref 22–32)
Calcium: 8.7 mg/dL — ABNORMAL LOW (ref 8.9–10.3)
Chloride: 98 mmol/L (ref 98–111)
Creatinine, Ser: 1.2 mg/dL (ref 0.61–1.24)
GFR calc Af Amer: >60 mL/min (ref >60)
GFR calc non Af Amer: 57 mL/min — ABNORMAL LOW (ref >60)
Glucose, Bld: 120 mg/dL — ABNORMAL HIGH (ref 70–99)
Potassium: 3.6 mmol/L (ref 3.5–5.1)
Sodium: 137 mmol/L (ref 135–145)

## 2018-06-14 LAB — CBC
HCT: 34.9 % — ABNORMAL LOW (ref 39.0–52.0)
Hemoglobin: 11.5 g/dL — ABNORMAL LOW (ref 13.0–17.0)
MCH: 31.9 pg (ref 26.0–34.0)
MCHC: 33 g/dL (ref 30.0–36.0)
MCV: 96.9 fL (ref 78.0–100.0)
Platelets: 299 10*3/uL (ref 150–400)
RBC: 3.6 MIL/uL — ABNORMAL LOW (ref 4.22–5.81)
RDW: 12.8 % (ref 11.5–15.5)
WBC: 8 10*3/uL (ref 4.0–10.5)

## 2018-06-14 MED ORDER — AMIODARONE HCL 400 MG PO TABS: 400.0000 mg | ORAL_TABLET | Freq: Two times a day (BID) | ORAL | 1 refills | Status: DC

## 2018-06-14 MED ORDER — ATORVASTATIN CALCIUM 40 MG PO TABS: 40.0000 mg | ORAL_TABLET | Freq: Every day | ORAL | 3 refills | Status: DC

## 2018-06-14 MED ORDER — POTASSIUM CHLORIDE CRYS ER 20 MEQ PO TBCR: 40.0000 meq | EXTENDED_RELEASE_TABLET | Freq: Every day | ORAL | 0 refills | Status: DC

## 2018-06-14 MED ORDER — FUROSEMIDE 40 MG PO TABS: 40.0000 mg | ORAL_TABLET | Freq: Every day | ORAL | 0 refills | Status: DC

## 2018-06-14 NOTE — Progress Notes (Signed)
RUA PICC d/c'ed per order.  Site WNL, cleaned with CHG and covered with vaseline guaze and dry 2x2.  Pt verbalizes understanding of staying in bed til 1015, signs of infection, interventions for bleeding, and when to call doctor via [place].  RN aware.

## 2018-06-14 NOTE — Progress Notes (Signed)
Order received to discharge patient.  Telemetry monitor removed and CCMD notified.  Orders placed to remove PICC.  Discharge instructions, follow up, medications and instructions for their use discussed with patient.  Patient was sent home with rolling walker.

## 2018-06-14 NOTE — Progress Notes (Signed)
      [address]       [place] [address]             [phone]      8 Days Post-Op Procedure(s) (LRB): CORONARY ARTERY BYPASS GRAFTING (CABG) x 3 WITH ENDOSCOPIC HARVESTING OF RIGHT SAPHENOUS VEIN (N/A) TRANSESOPHAGEAL ECHOCARDIOGRAM (TEE) (N/A)   Subjective:  He states he is doing well.  He feels great and is anxious to go home.  + ambulation  + BM  Objective: Vital signs in last 24 hours: Temp:  [98.3 F (36.8 C)-100.1 F (37.8 C)] 98.3 F (36.8 C) (07/20 0349) Pulse Rate:  [74-83] 74 (07/20 0349) Cardiac Rhythm: Normal sinus rhythm (07/20 0700) Resp:  [23-27] 23 (07/20 0349) BP: (118-121)/(59-61) 118/61 (07/20 0349) SpO2:  [94 %-96 %] 94 % (07/20 0349) Weight:  [250 lb (113.4 kg)] 250 lb (113.4 kg) (07/20 0349)  Intake/Output from previous day: 07/19 0701 - 07/20 0700 In: 250 [P.O.:250] Out: -   General appearance: alert, cooperative and no distress Heart: regular rate and rhythm Lungs: clear to auscultation bilaterally Abdomen: soft, non-tender; bowel sounds normal; no masses,  no organomegaly Extremities: edema trace Wound: clean and dry  Lab Results: Recent Labs    06/13/18 0659 06/14/18 0412  WBC 9.0 8.0  HGB 11.5* 11.5*  HCT 34.5* 34.9*  PLT 290 299   BMET:  Recent Labs    06/13/18 0659 06/14/18 0412  NA 134* 137  K 2.9* 3.6  CL 93* 98  CO2 27 28  GLUCOSE 120* 120*  BUN 28* 25*  CREATININE 1.17 1.20  CALCIUM 8.9 8.7*    PT/INR: No results for input(s): LABPROT, INR in the last 72 hours. ABG    Component Value Date/Time   PHART 7.354 06/09/2018 0821   HCO3 24.4 06/09/2018 0821   TCO2 29 06/11/2018 0552   ACIDBASEDEF 1.0 06/09/2018 0821   O2SAT 59.2 06/11/2018 0550   CBG (last 3)  No results for input(s): GLUCAP in the last 72 hours.  Assessment/Plan: S/P Procedure(s) (LRB): CORONARY ARTERY BYPASS GRAFTING (CABG) x 3 WITH ENDOSCOPIC HARVESTING OF RIGHT SAPHENOUS VEIN (N/A) TRANSESOPHAGEAL ECHOCARDIOGRAM (TEE)  (N/A)  1. CV- PAF, maintaining NSR >48 hours- continue Amiodarone, Lopressor 2. Pulm- no acute issues, continue IS 3. Renal- creatinine WNL, weight is trending down, will continue lasix for another 7 days 4. Hypokalemia- resolved, K at 3.6 today, will continue supplementation 5. DM- sugars controlled 6. Dispo- patient stable, hypokalemia resolved, hemodynamically stable in NSR, will d/c home today   LOS: 8 days    Ellwood Handler 06/14/2018

## 2018-06-14 NOTE — Care Management Note (Signed)
Case Management Note  Patient Details  Name: TRAE BOVENZI MRN: [id number] Date of Birth: Mar 09, 1941  Subjective/Objective: Pt presented s/p CABG. PTA from home with support of spouse. PT recommendations for [facility] PT- Order has been placed for [facility] RN- additional orders needed for PT. Staff RN aware- of additional order needed.                      Action/Plan: Referral made to Brad with [facility] will be delivered to room prior to transition home. HH RN/PT orders given to [facility] per choice of patient. SOC to begin by Tuesday/Wednesday next week. CM did ask patient if he wanted new agency. Patient states he has used [facility] in the past and the start time is acceptable. No further needs from CM at this time.   Expected Discharge Date:  06/14/18               Expected Discharge Plan:  [place]  In-House Referral:     Discharge planning Services  CM Consult  Post Acute Care Choice:  Durable Medical Equipment, Home Health Choice offered to:  Patient  DME Arranged:  Walker rolling with seat DME Agency:  [place]:  PT [facility] Agency:  [facility]  Status of Service:  Completed, signed off  If discussed at [place] of Stay Meetings, dates discussed:    Additional Comments:  Bethena Roys, RN 06/14/2018, 10:11 AM

## 2018-06-18 ENCOUNTER — Telehealth (HOSPITAL_COMMUNITY): Payer: Self-pay

## 2018-06-18 NOTE — Telephone Encounter (Signed)
Attempted to call patient in regards to participating in Cardiac Rehab - Lm on Vm

## 2018-06-18 NOTE — Telephone Encounter (Signed)
Pt insurance is active and benefits verified through [employer]. Co-pay $45.00, DED $0.00/$0.00 met, out of pocket $4,200.00/$4,200.00 met, co-insurance 0%. No pre-authorization required. Passport, 06/18/18 @ 12:12PM, HQI#[id number]  Will contact patient to see if he is interested in the Cardiac Rehab Program. If interested, patient will need to complete follow up appt. Once completed, patient will be contacted for scheduling upon review by the RN Navigator.

## 2018-06-25 ENCOUNTER — Ambulatory Visit: Payer: Medicare HMO | Admitting: Nurse Practitioner

## 2018-06-25 ENCOUNTER — Encounter: Payer: Self-pay | Admitting: Nurse Practitioner

## 2018-06-25 ENCOUNTER — Other Ambulatory Visit: Payer: Self-pay

## 2018-06-25 ENCOUNTER — Ambulatory Visit (INDEPENDENT_AMBULATORY_CARE_PROVIDER_SITE_OTHER): Payer: Self-pay | Admitting: Surgical

## 2018-06-25 VITALS — BP 104/61 | HR 70 | Temp 97.2°F | Resp 18 | Ht 71.0 in | Wt 257.0 lb

## 2018-06-25 VITALS — BP 122/60 | HR 71 | Ht 71.0 in | Wt 257.0 lb

## 2018-06-25 DIAGNOSIS — Z951 Presence of aortocoronary bypass graft: Secondary | ICD-10-CM | POA: Diagnosis not present

## 2018-06-25 DIAGNOSIS — I480 Paroxysmal atrial fibrillation: Secondary | ICD-10-CM

## 2018-06-25 DIAGNOSIS — I2510 Atherosclerotic heart disease of native coronary artery without angina pectoris: Secondary | ICD-10-CM | POA: Diagnosis not present

## 2018-06-25 DIAGNOSIS — Z4801 Encounter for change or removal of surgical wound dressing: Secondary | ICD-10-CM

## 2018-06-25 DIAGNOSIS — I10 Essential (primary) hypertension: Secondary | ICD-10-CM | POA: Diagnosis not present

## 2018-06-25 DIAGNOSIS — E7800 Pure hypercholesterolemia, unspecified: Secondary | ICD-10-CM

## 2018-06-25 DIAGNOSIS — Z5189 Encounter for other specified aftercare: Secondary | ICD-10-CM

## 2018-06-25 LAB — BASIC METABOLIC PANEL
BUN/Creatinine Ratio: 8 — ABNORMAL LOW (ref 10–24)
BUN: 11 mg/dL (ref 8–27)
CO2: 23 mmol/L (ref 20–29)
Calcium: 8.9 mg/dL (ref 8.6–10.2)
Chloride: 107 mmol/L — ABNORMAL HIGH (ref 96–106)
Creatinine, Ser: 1.37 mg/dL — ABNORMAL HIGH (ref 0.76–1.27)
GFR calc Af Amer: 58 mL/min/{1.73_m2} — ABNORMAL LOW (ref >59)
GFR calc non Af Amer: 50 mL/min/{1.73_m2} — ABNORMAL LOW (ref >59)
Glucose: 108 mg/dL — ABNORMAL HIGH (ref 65–99)
Potassium: 5.1 mmol/L (ref 3.5–5.2)
Sodium: 143 mmol/L (ref 134–144)

## 2018-06-25 LAB — CBC
Hematocrit: 35.5 % — ABNORMAL LOW (ref 37.5–51.0)
Hemoglobin: 11.5 g/dL — ABNORMAL LOW (ref 13.0–17.7)
MCH: 32.3 pg (ref 26.6–33.0)
MCHC: 32.4 g/dL (ref 31.5–35.7)
MCV: 100 fL — ABNORMAL HIGH (ref 79–97)
Platelets: 328 10*3/uL (ref 150–450)
RBC: 3.56 x10E6/uL — ABNORMAL LOW (ref 4.14–5.80)
RDW: 13.8 % (ref 12.3–15.4)
WBC: 5.5 10*3/uL (ref 3.4–10.8)

## 2018-06-25 MED ORDER — AMIODARONE HCL 400 MG PO TABS: 200.0000 mg | ORAL_TABLET | Freq: Two times a day (BID) | ORAL | 1 refills | Status: DC

## 2018-06-25 NOTE — Patient Instructions (Signed)
Daily dressing changes as shown to you in office

## 2018-06-25 NOTE — Progress Notes (Signed)
CARDIOLOGY OFFICE NOTE  Date:  06/25/2018    Allen James Date of Birth: Aug 13, 1941 Medical Record #[id number]  PCP:  Delilah Shan, MD  Cardiologist:  Burt Knack    Chief Complaint  Patient presents with  . Coronary Artery Disease    Post hospital visit - seen for Dr. Burt Knack    History of Present Illness: Allen James is a 77 y.o. male who presents today for a post hospital visit. Seen for Dr. Burt Knack.   He has a known history of HLD and known CAD with prior NSTEMI with DES to RCA x 2 in 2015.   Seen here about a month ago by Dr. Burt Knack with progressive chest discomfort. Referred for cardiac cath. This was performed on 05/30/2018 which showed mutlivessel CAD. LV function was normal.   It was felt coronary bypass grafting would be indicated and TCTS consult was obtained.  He was evaluated by Dr. Prescott Gum. Plavix was held for 5 days. He was taken to the operating room and underwent CABG x 3 on 06/06/2018 with LIMA to LAD, SVG to Diagonal, and SVG to OM and endoscopic harvest of greater saphenous vein from his right leg. His post op course was eventful for AKI, PAF and volume overload.    Comes in today. Here with his wife. Doing ok for the most part but has had constant drainage from his vein harvesting site - this has been persistent since his surgery. He notes a "constant dripping" from the site. The site is now red and warm to touch. If he gets up and tries to walk around - it "comes out even faster". He has not been able to be as active as he would like because of this. He has left it open - he is using a handkerchief "to catch it". Other than this, he feels like he is doing well. No palpitations. Breathing is ok. Very little pain medicine. Saw his PCP on Friday - they stopped his Lasix/potassium. Appetite is ok. He remains on 400 mg of amiodarone BID.   Past Medical History:  Diagnosis Date  . Abdominal hernia    pt. states he had it for 10 yrs or longer  . Abnormal  findings on cardiac catheterization    ! Do NOT use RIGHT radial access on future caths or PCI.  Marland Kitchen Acute non-ST elevation myocardial infarction (NSTEMI) ([place]) 04/12/2014  . Acute non-ST segment elevation myocardial infarction ([place]) 04/12/2014  . Adiposity 04/14/2014  . AKI (acute kidney injury) ([place]) 04/14/2014  . Anginal pain ([place])   . Arthritis   . Back pain   . Benign essential hypertension 12/20/2016  . CAD (coronary artery disease)    a. NSTEMI 04/12/14 s/p DES x 2 to RCA. Prox 3.5x38 mm Xience Alpine DES, Dist 3.5x28 mm Xience DES; EF 65-70%. Staged PCI to the OM1 with 2.75 x 12 mm Resolute DES     . Chronic coronary artery disease 04/14/2014  . Chronic lower back pain 07/13/2014  . Dyspepsia 03/07/2016  . Dyspnea   . GERD (gastroesophageal reflux disease)   . H/O hiatal hernia   . Hyperlipidemia   . Injury of kidney 04/14/2014  . Muscle soreness 04/27/2014  . NSTEMI (non-ST elevated myocardial infarction) ([place]) 04/12/2014  . Obesity 04/14/2014  . Obstructive apnea 12/28/2014  . OSA on CPAP   . Osteoarthrosis 09/30/2014  . Primary insomnia 03/07/2016  . Pure hypercholesterolemia 12/28/2014  . SLEEP APNEA 02/24/2009   Qualifier: Diagnosis of  By: Annamaria Boots MD, Odell.Proctor D     Past Surgical History:  Procedure Laterality Date  . APPENDECTOMY     Remote  . CARPAL TUNNEL RELEASE Bilateral   . CORONARY ANGIOPLASTY  04/12/14   STENT TO RCA  . CORONARY ARTERY BYPASS GRAFT N/A 06/06/2018   Procedure: CORONARY ARTERY BYPASS GRAFTING (CABG) x 3 WITH ENDOSCOPIC HARVESTING OF RIGHT SAPHENOUS VEIN;  Surgeon: Ivin Poot, MD;  Location: [facility];  Service: Open Heart Surgery;  Laterality: N/A;  . KNEE ARTHROSCOPY Bilateral   . LEFT HEART CATH AND CORONARY ANGIOGRAPHY N/A 05/30/2018   Procedure: LEFT HEART CATH AND CORONARY ANGIOGRAPHY;  Surgeon: Sherren Mocha, MD;  Location: [facility] CV LAB;  Service: Cardiovascular;  Laterality: N/A;  . LEFT HEART CATHETERIZATION WITH CORONARY ANGIOGRAM N/A 04/12/2014    Procedure: LEFT HEART CATHETERIZATION WITH CORONARY ANGIOGRAM;  Surgeon: Blane Ohara, MD;  Location: [facility] CATH LAB;  Service: Cardiovascular;  Laterality: N/A;  . PERCUTANEOUS CORONARY STENT INTERVENTION (PCI-S)  04/12/2014   Procedure: PERCUTANEOUS CORONARY STENT INTERVENTION (PCI-S);  Surgeon: Blane Ohara, MD;  Location: [facility] CATH LAB;  Service: Cardiovascular;;  . PERCUTANEOUS CORONARY STENT INTERVENTION (PCI-S) N/A 04/14/2014   Procedure: PERCUTANEOUS CORONARY STENT INTERVENTION (PCI-S);  Surgeon: Peter M Martinique, MD;  Location: [facility] CATH LAB;  Service: Cardiovascular;  Laterality: N/A;  . TEE WITHOUT CARDIOVERSION N/A 06/06/2018   Procedure: TRANSESOPHAGEAL ECHOCARDIOGRAM (TEE);  Surgeon: Prescott Gum, Collier Salina, MD;  Location: [facility];  Service: Open Heart Surgery;  Laterality: N/A;     Medications: Current Meds  Medication Sig  . amiodarone (PACERONE) 400 MG tablet Take 0.5 tablets (200 mg total) by mouth 2 (two) times daily. 200 mg twice a day for 3 days, then 200 mg a day until seen back.  Marland Kitchen aspirin EC 81 MG tablet Take 81 mg by mouth daily.  Marland Kitchen atorvastatin (LIPITOR) 40 MG tablet Take 1 tablet (40 mg total) by mouth daily at 6 PM.  . docusate sodium (COLACE) 250 MG capsule Take 250 mg by mouth daily as needed for constipation.   Marland Kitchen HYDROcodone-acetaminophen (NORCO/VICODIN) 5-325 MG per tablet Take 1 tablet by mouth 3 (three) times daily as needed for moderate pain.   . meloxicam (MOBIC) 15 MG tablet Take 15 mg by mouth daily.  . metoprolol succinate (TOPROL-XL) 50 MG 24 hr tablet Take 1 tablet (50 mg total) by mouth daily.  . nitroGLYCERIN (NITROSTAT) 0.4 MG SL tablet Place 1 tablet (0.4 mg total) under the tongue every 5 (five) minutes as needed for chest pain.  . pantoprazole (PROTONIX) 40 MG tablet Take 1 tablet (40 mg total) by mouth daily.  . traMADol (ULTRAM) 50 MG tablet Take 100 mg by mouth every 8 (eight) hours as needed for moderate pain.   . [DISCONTINUED] amiodarone (PACERONE) 400  MG tablet Take 1 tablet (400 mg total) by mouth 2 (two) times daily.     Allergies: No Known Allergies  Social History: The patient  reports that he quit smoking about 35 years ago. He has never used smokeless tobacco. He reports that he does not drink alcohol or use drugs.   Family History: The patient's family history includes Coronary artery disease (age of onset: 60) in his mother.   Review of Systems: Please see the history of present illness.   Otherwise, the review of systems is positive for none.   All other systems are reviewed and negative.   Physical Exam: VS:  BP 122/60 (BP Location: Left Arm, Patient  Position: Sitting, Cuff Size: Normal)   Pulse 71   Ht 5\' 11"  (1.803 m)   Wt 257 lb (116.6 kg)   BMI 35.84 kg/m  .  BMI Body mass index is 35.84 kg/m.  Wt Readings from Last 3 Encounters:  06/25/18 257 lb (116.6 kg)  06/14/18 250 lb (113.4 kg)  06/05/18 264 lb 6.4 oz (119.9 kg)    General: Pleasant. He looks younger than his stated age. He is alert and in no acute distress.   HEENT: Normal.  Neck: Supple, no JVD, carotid bruits, or masses noted.  Cardiac: Regular rate and rhythm. No murmurs, rubs, or gallops. His sternum looks ok. He does have redness and a little warmth and a constant clear drainage noted. No edema.  Respiratory:  Lungs are clear to auscultation bilaterally with normal work of breathing.  GI: Soft and nontender.  MS: No deformity or atrophy. Gait and ROM intact.  Skin: Warm and dry. Color is normal.  Neuro:  Strength and sensation are intact and no gross focal deficits noted.  Psych: Alert, appropriate and with normal affect.   LABORATORY DATA:  EKG:  EKG is ordered today. This demonstrates NSR with nonspecific ST and T wave changes.  Lab Results  Component Value Date   WBC 8.0 06/14/2018   HGB 11.5 (L) 06/14/2018   HCT 34.9 (L) 06/14/2018   PLT 299 06/14/2018   GLUCOSE 120 (H) 06/14/2018   CHOL 183 05/03/2016   TRIG 147 05/03/2016    HDL 39 (L) 05/03/2016   LDLCALC 115 05/03/2016   ALT 21 06/08/2018   AST 40 06/08/2018   NA 137 06/14/2018   K 3.6 06/14/2018   CL 98 06/14/2018   CREATININE 1.20 06/14/2018   BUN 25 (H) 06/14/2018   CO2 28 06/14/2018   TSH 4.936 (H) 06/11/2018   INR 1.21 06/06/2018   HGBA1C 6.0 (H) 06/05/2018     BNP (last 3 results) No results for input(s): BNP in the last 8760 hours.  ProBNP (last 3 results) No results for input(s): PROBNP in the last 8760 hours.   Other Studies Reviewed Today:  LEFT HEART CATH AND CORONARY ANGIOGRAPHY 05/2018  Conclusion     The left ventricular systolic function is normal.  LV end diastolic pressure is mildly elevated.  The left ventricular ejection fraction is 55-65% by visual estimate.  Ost LAD lesion is 90% stenosed.  Prox LAD lesion is 50% stenosed.  Mid LAD lesion is 40% stenosed.  2nd Diag lesion is 40% stenosed.  Ost 1st Mrg lesion is 50% stenosed.  Previously placed 1st Mrg stent (unknown type) is widely patent.  Previously placed Prox RCA to Mid RCA stent (unknown type) is widely patent.  Previously placed Mid RCA to Dist RCA stent (unknown type) is widely patent.  Mid RCA lesion is 50% stenosed.   1.  Severe ostial LAD stenosis with mild to moderate proximal and mid LAD stenosis 2.  Patent left main with no significant stenosis 3.  Patent left circumflex stent (first OM) with moderate stenosis proximal to the stented segment 4.  Patent RCA stents with mild to moderate stenosis just between the stents in the mid RCA 5.  Normal LV function  Recommendations: Recommend cardiac surgical consultation for consideration of CABG.  Patient has diffuse coronary artery disease and severe stenosis involving the true ostium of the LAD.  I think he would benefit from surgical revascularization.  He will be instructed to discontinue clopidogrel in anticipation of CABG.  Will refer for outpatient surgical consult.    CABG   06/06/2018  PROCEDURE PERFORMED: 1.  Coronary artery bypass grafting x3 (left internal mammary artery to left anterior descending, saphenous vein graft to diagonal, saphenous vein graft to circumflex marginal). 2.  Endoscopic harvest of right leg greater saphenous vein.   Assessment/Plan:  1. CAD - prior NSTEMI with DES x 2 to the RCA in 2015 - recently with progressive angina - s/p cath with subsequent CABG x 3 - he is doing well other than the drainage from the vein harvesting site - probably with some local infection as well. Dr. Darcey Nora will be seeing him later today. He does not wish to repeat cardiac rehab at this time. Repeat lab today.   2. Post of AF - remains in sinus - I am cutting amiodarone back. Hope to stop when I see him back.   3. Post op volume overload - PCP has stopped his Lasix and potassium. Rechecking lab today. Looks good clinically. Weight is down.   4. HTN - BP ok on current regimen.   5. HLD - remains on statin therapy  Current medicines are reviewed with the patient today.  The patient does not have concerns regarding medicines other than what has been noted above.  The following changes have been made:  See above.  Labs/ tests ordered today include:    Orders Placed This Encounter  Procedures  . Basic metabolic panel  . CBC  . EKG 12-Lead     Disposition:   FU with me in about 3 to 4 weeks. See Dr. Burt Knack in about 3 months. Seeing Dr. Darcey Nora later today.   Patient is agreeable to this plan and will call if any problems develop in the interim.   SignedTruitt Merle, NP  06/25/2018 11:43 AM  [facility] [address] [place], [place]  [address] Phone: [phone] Fax: [phone]

## 2018-06-25 NOTE — Progress Notes (Signed)
Allen James       ,[place] [id number]             [phone]      Harve[place] D Seefel[place] Medical Record #[id number] Date of Birth: May 12, 1941  Referring: Sherren Mocha, MD Primary Care: Delilah Shan, MD Primary Cardiologist: Sherren Mocha, MD   Chief Complaint:   POST OP FOLLOW UP OPERATIVE REPORT  DATE OF PROCEDURE:  06/06/2018  PROCEDURE PERFORMED: 1.  Coronary artery bypass grafting x3 (left internal mammary artery to left anterior descending, saphenous vein graft to diagonal, saphenous vein graft to circumflex marginal). 2.  Endoscopic harvest of right leg greater saphenous vein.  SURGEON:  Len Childs, MD  ASSISTANT:  Lars Pinks PA-C  ANESTHESIA:  General by Dr. Tamela Gammon  PREOPERATIVE DIAGNOSES:  Unstable angina, severe; recurrent multivessel coronary artery disease with history of previous percutaneous coronary intervention.  POSTOPERATIVE DIAGNOSES:  Unstable angina, severe; recurrent multivessel coronary artery disease with history of previous percutaneous coronary intervention   History of Present Illness:    The patient is a 77 year old male seen today after being sent by cardiology d/t draining right leg evh site. He dinies fevers or chills. The leg has been somewhat edematous. Lasix and potassium where recently stopped by PMD.       Past Medical History:  Diagnosis Date  . Abdominal hernia    pt. states he had it for 10 yrs or longer  . Abnormal findings on cardiac catheterization    ! Do NOT use RIGHT radial access on future caths or PCI.  Marland Kitchen Acute non-ST elevation myocardial infarction (NSTEMI) ([place]) 04/12/2014  . Acute non-ST segment elevation myocardial infarction ([place]) 04/12/2014  . Adiposity 04/14/2014  . AKI (acute kidney injury) ([place]) 04/14/2014  . Anginal pain ([place])   . Arthritis   . Back pain   . Benign essential hypertension 12/20/2016  . CAD (coronary artery disease)    a. NSTEMI  04/12/14 s/p DES x 2 to RCA. Prox 3.5x38 mm Xience Alpine DES, Dist 3.5x28 mm Xience DES; EF 65-70%. Staged PCI to the OM1 with 2.75 x 12 mm Resolute DES     . Chronic coronary artery disease 04/14/2014  . Chronic lower back pain 07/13/2014  . Dyspepsia 03/07/2016  . Dyspnea   . GERD (gastroesophageal reflux disease)   . H/O hiatal hernia   . Hyperlipidemia   . Injury of kidney 04/14/2014  . Muscle soreness 04/27/2014  . NSTEMI (non-ST elevated myocardial infarction) ([place]) 04/12/2014  . Obesity 04/14/2014  . Obstructive apnea 12/28/2014  . OSA on CPAP   . Osteoarthrosis 09/30/2014  . Primary insomnia 03/07/2016  . Pure hypercholesterolemia 12/28/2014  . SLEEP APNEA 02/24/2009   Qualifier: Diagnosis of  By: Annamaria Boots MD, [place] D      Social History   Tobacco Use  Smoking Status Former Smoker  . Last attempt to quit: 11/26/1982  . Years since quitting: 35.6  Smokeless Tobacco Never Used    Social History   Substance and Sexual Activity  Alcohol Use No     No Known Allergies  Current Outpatient Medications  Medication Sig Dispense Refill  . amiodarone (PACERONE) 400 MG tablet Take 0.5 tablets (200 mg total) by mouth 2 (two) times daily. 200 mg twice a day for 3 days, then 200 mg a day until seen back. 60 tablet 1  . aspirin EC 81 MG tablet Take 81 mg by mouth daily.    Marland Kitchen  atorvastatin (LIPITOR) 40 MG tablet Take 1 tablet (40 mg total) by mouth daily at 6 PM. 30 tablet 3  . docusate sodium (COLACE) 250 MG capsule Take 250 mg by mouth daily as needed for constipation.     Marland Kitchen HYDROcodone-acetaminophen (NORCO/VICODIN) 5-325 MG per tablet Take 1 tablet by mouth 3 (three) times daily as needed for moderate pain.     . meloxicam (MOBIC) 15 MG tablet Take 15 mg by mouth daily.  1  . metoprolol succinate (TOPROL-XL) 50 MG 24 hr tablet Take 1 tablet (50 mg total) by mouth daily. 30 tablet 6  . nitroGLYCERIN (NITROSTAT) 0.4 MG SL tablet Place 1 tablet (0.4 mg total) under the tongue every 5 (five) minutes  as needed for chest pain. 25 tablet 5  . pantoprazole (PROTONIX) 40 MG tablet Take 1 tablet (40 mg total) by mouth daily. 90 tablet 3  . traMADol (ULTRAM) 50 MG tablet Take 100 mg by mouth every 8 (eight) hours as needed for moderate pain.      No current facility-administered medications for this visit.        Physical Exam: BP 104/61 (BP Location: Right Arm, Patient Position: Sitting, Cuff Size: Large)   Pulse 70   Temp (!) 97.2 F (36.2 C)   Resp 18   Ht 5\' 11"  (1.803 m)   Wt 116.6 kg (257 lb)   SpO2 97% Comment: RA  BMI 35.84 kg/m   General appearance: alert, cooperative and no distress Extremities: + ankle/Le edema Wound: the evh site was examined and a q tip probed in the proximal portion did reveal a space but didn't track. No cellulitis. Drainage is serous   Diagnostic Studies & Laboratory data:     Recent Radiology Findings:   No results found.    Recent Lab Findings: Lab Results  Component Value Date   WBC 5.5 06/25/2018   HGB 11.5 (L) 06/25/2018   HCT 35.5 (L) 06/25/2018   PLT 328 06/25/2018   GLUCOSE 108 (H) 06/25/2018   CHOL 183 05/03/2016   TRIG 147 05/03/2016   HDL 39 (L) 05/03/2016   LDLCALC 115 05/03/2016   ALT 21 06/08/2018   AST 40 06/08/2018   NA WILL FOLLOW 06/25/2018   K WILL FOLLOW 06/25/2018   CL WILL FOLLOW 06/25/2018   CREATININE 1.37 (H) 06/25/2018   BUN 11 06/25/2018   CO2 23 06/25/2018   TSH 4.936 (H) 06/11/2018   INR 1.21 06/06/2018   HGBA1C 6.0 (H) 06/05/2018      Assessment / Plan:  Superficial right evh site dehiscence with some degree of space. Will pack q day with iodoform and recheck in one week. Sooner if worsens. No current indication for [place], PA-C 06/25/2018 3:45 PM  Pager [phone]

## 2018-06-25 NOTE — Patient Instructions (Addendum)
We will be checking the following labs today - BMET and CBC   Medication Instructions:    Continue with your current medicines. BUT  I am cutting the amiodarone back to just 1/2 tablet (200 mg) twice a day for 3 days and then cut back to just 1/2 tablet (200 mg) once a day - we hope to stop this when you come back    Testing/Procedures To Be Arranged:  N/A  Follow-Up:   See Dr. Darcey Nora today at [facility]  See me in about 3 to 4 weeks with repeat EKG    Other Special Instructions:   N/A    If you need a refill on your cardiac medications before your next appointment, please call your pharmacy.   Call the [facility] [place] office at [phone] if you have any questions, problems or concerns.

## 2018-06-27 ENCOUNTER — Telehealth: Payer: Self-pay

## 2018-06-27 NOTE — Telephone Encounter (Signed)
Patient's wife contacted the office concerned.  She stated she has been packing his EVH site wound for 2 days now, and when the packing was pulled out today she stated that it looked like "pus" when the packing was pulled out. She was requesting the patient have an antibiotic. Upon reviewing the patient's note at his recent visit for the wound, [place], [place] stated that it did not warrant an antibiotic at this time.  I advised the patient that the wound is healing from the inside out and she may see the gauze when removed to have a yellowish- red tent when removed.  This is a normal process of healing.  I advised her that if she notices him to have a temperature, chills, thick purulent drainage with an odor or smell to give [place] a call back.  She stated that the site looks the same and the redness has not changed.  I advised her to give [place] a call back if anything changes.  She acknowledged receipt.  He is due to come back this coming Tuesday for another wound check.  Will continue to monitor.

## 2018-07-01 ENCOUNTER — Ambulatory Visit (INDEPENDENT_AMBULATORY_CARE_PROVIDER_SITE_OTHER): Payer: Self-pay | Admitting: Physician Assistant

## 2018-07-01 VITALS — BP 122/71 | HR 76 | Resp 20 | Ht 71.0 in | Wt 256.0 lb

## 2018-07-01 DIAGNOSIS — Z951 Presence of aortocoronary bypass graft: Secondary | ICD-10-CM

## 2018-07-01 DIAGNOSIS — Z5189 Encounter for other specified aftercare: Secondary | ICD-10-CM

## 2018-07-01 DIAGNOSIS — I2510 Atherosclerotic heart disease of native coronary artery without angina pectoris: Secondary | ICD-10-CM

## 2018-07-01 MED ORDER — CEPHALEXIN 500 MG PO CAPS: 500.0000 mg | ORAL_CAPSULE | Freq: Three times a day (TID) | ORAL | 0 refills | Status: DC

## 2018-07-01 NOTE — Patient Instructions (Addendum)
Continue current dressing changes as needed;patient told to pack wound only if continues to drain  Elevate legs as much as possible when sitting  Please take antibiotics as directed and until finished  Please make follow up appointment with medical doctor regarding HGA1C 6 (pre diabetes)

## 2018-07-01 NOTE — Progress Notes (Signed)
  HPI:  Patient returns for follow up regarding dehiscence of right EVH site. Patient is s/p CABG x 3 by Dr. Prescott Gum on 06/06/2018. The patient's early postoperative recovery while in the hospital was notable for atrial fibrillation. He did convert to sinus rhythm with Amiodarone. He was last seen by Allen Pierini PA-C on 06/25/2018 because his [facility] site was draining. Patient denies fever, chills, shortness of breath, or chest pain.    Current Outpatient Medications  Medication Sig Dispense Refill  . amiodarone (PACERONE) 400 MG tablet Take 0.5 tablets (200 mg total) by mouth 2 (two) times daily. 200 mg twice a day for 3 days, then 200 mg a day until seen back. 60 tablet 1  . aspirin EC 81 MG tablet Take 81 mg by mouth daily.    Marland Kitchen atorvastatin (LIPITOR) 40 MG tablet Take 1 tablet (40 mg total) by mouth daily at 6 PM. 30 tablet 3  . docusate sodium (COLACE) 250 MG capsule Take 250 mg by mouth daily as needed for constipation.     Marland Kitchen HYDROcodone-acetaminophen (NORCO/VICODIN) 5-325 MG per tablet Take 1 tablet by mouth 3 (three) times daily as needed for moderate pain.     . meloxicam (MOBIC) 15 MG tablet Take 15 mg by mouth daily.  1  . metoprolol succinate (TOPROL-XL) 50 MG 24 hr tablet Take 1 tablet (50 mg total) by mouth daily. 30 tablet 6  . nitroGLYCERIN (NITROSTAT) 0.4 MG SL tablet Place 1 tablet (0.4 mg total) under the tongue every 5 (five) minutes as needed for chest pain. 25 tablet 5  . pantoprazole (PROTONIX) 40 MG tablet Take 1 tablet (40 mg total) by mouth daily. 90 tablet 3  . traMADol (ULTRAM) 50 MG tablet Take 100 mg by mouth every 8 (eight) hours as needed for moderate pain.     Vital Signs: BP 122/71, HR 76,  RR 20, Oxygenation 98% on room air   Physical Exam: CV-RRR Pulmonary-Clear to auscultation bilaterally Extremities-Trace LE edema Wounds-Sternal wound is clean and dry, well healed. Eschar right chest tube wound. RLE thigh wound dehisced and draining clear  fluid.  Impression and Plan: Allen James likely has a seroma that is draining from the right EVH wound. Dr. Prescott Gum saw and evaluated the patient with me. The drainage was clear and NOT purulent. He used silver nitrate in the wound and then placed a "water proof" dressing" on it. Also, he was given a prescription for Keflex 500 mg tid. He was instructed to elevate legs as much as possible while sitting and to pack wound only if continues to drain. He has an appointment for 07/07/2018.    Allen Skillern, PA-C [facility] [phone]

## 2018-07-02 ENCOUNTER — Other Ambulatory Visit: Payer: Self-pay | Admitting: Cardiothoracic Surgery

## 2018-07-02 ENCOUNTER — Telehealth: Payer: Self-pay

## 2018-07-02 DIAGNOSIS — Z951 Presence of aortocoronary bypass graft: Secondary | ICD-10-CM

## 2018-07-02 NOTE — Telephone Encounter (Signed)
Clair Gulling, PT with [facility] [phone] contacted the office to state that Mr. Busic had missed one session of in-home physical therapy due to [facility] site drainage.  Per patient, he was told that he could not do any PT until his [facility] site was healed.  I spoke with Dr. Prescott Gum for clarification and he stated that the patient could do PT in home.  He is however, to keep his leg elevated when sitting.  Clair Gulling acknowledged receipt.  He was also given a verbal order to make-up the one visit that was missed.  I also, per Jim's request called the patient and made him aware of the changes and he thanked me for the call.

## 2018-07-06 ENCOUNTER — Other Ambulatory Visit: Payer: Self-pay | Admitting: Physician Assistant

## 2018-07-07 ENCOUNTER — Other Ambulatory Visit: Payer: Self-pay

## 2018-07-07 ENCOUNTER — Ambulatory Visit
Admission: RE | Admit: 2018-07-07 | Discharge: 2018-07-07 | Disposition: A | Discharge status: Home / Self Care | Payer: Medicare HMO | Source: Ambulatory Visit | Attending: Cardiothoracic Surgery | Admitting: Cardiothoracic Surgery

## 2018-07-07 ENCOUNTER — Ambulatory Visit (INDEPENDENT_AMBULATORY_CARE_PROVIDER_SITE_OTHER): Payer: Self-pay | Admitting: Surgical

## 2018-07-07 VITALS — BP 117/73 | HR 74 | Temp 97.0°F | Resp 18 | Ht 71.0 in | Wt 255.8 lb

## 2018-07-07 DIAGNOSIS — Z951 Presence of aortocoronary bypass graft: Secondary | ICD-10-CM

## 2018-07-07 MED ORDER — FUROSEMIDE 40 MG PO TABS: 40.0000 mg | ORAL_TABLET | Freq: Every day | ORAL | 0 refills | Status: DC

## 2018-07-07 MED ORDER — POTASSIUM CHLORIDE CRYS ER 10 MEQ PO TBCR: 20.0000 meq | EXTENDED_RELEASE_TABLET | Freq: Every day | ORAL | Status: AC

## 2018-07-07 MED ORDER — POTASSIUM CHLORIDE ER 20 MEQ PO TBCR: 20.0000 meq | EXTENDED_RELEASE_TABLET | Freq: Every day | ORAL | 0 refills | Status: DC

## 2018-07-07 NOTE — Progress Notes (Signed)
[address]       [place] [address]             [phone]      Allen James Medical Record #[id number] Date of Birth: November 28, 1940  Referring: Sherren Mocha, MD Primary Care: Delilah Shan, MD Primary Cardiologist: Sherren Mocha, MD   Chief Complaint:   POST OP FOLLOW UP OPERATIVE REPORT  DATE OF PROCEDURE: 06/06/2018  PROCEDURE PERFORMED: 1. Coronary artery bypass grafting x3 (left internal mammary artery to left anterior descending, saphenous vein graft to diagonal, saphenous vein graft to circumflex marginal). 2. Endoscopic harvest of right leg greater saphenous vein.  SURGEON: Len Childs, MD  ASSISTANT: Lars Pinks PA-C  ANESTHESIA: General by Dr. Tamela Gammon  PREOPERATIVE DIAGNOSES: Unstable angina, severe; recurrent multivessel coronary artery disease with history of previous percutaneous coronary intervention.  POSTOPERATIVE DIAGNOSES: Unstable angina, severe; recurrent multivessel coronary artery disease with history of previous percutaneous coronary intervention History of Present Illness:    Patient is seen in the office on today's date and routine postsurgical follow-up.  Continues to have drainage from his right lower extremity EVH site.  This basically his only concern currently otherwise he describes excellent ongoing progress.      Past Medical History:  Diagnosis Date  . Abdominal hernia    pt. states he had it for 10 yrs or longer  . Abnormal findings on cardiac catheterization    ! Do NOT use RIGHT radial access on future caths or PCI.  Marland Kitchen Acute non-ST elevation myocardial infarction (NSTEMI) ([place]) 04/12/2014  . Acute non-ST segment elevation myocardial infarction ([place]) 04/12/2014  . Adiposity 04/14/2014  . AKI (acute kidney injury) ([place]) 04/14/2014  . Anginal pain ([place])   . Arthritis   . Back pain   . Benign essential hypertension 12/20/2016  . CAD (coronary artery disease)    a. NSTEMI 04/12/14 s/p DES x 2 to RCA. Prox 3.5x38 mm Xience Alpine DES, Dist 3.5x28 mm Xience DES; EF 65-70%. Staged PCI to the OM1 with 2.75 x 12 mm Resolute DES     . Chronic coronary artery disease 04/14/2014  . Chronic lower back pain 07/13/2014  . Dyspepsia 03/07/2016  . Dyspnea   . GERD (gastroesophageal reflux disease)   . H/O hiatal hernia   . Hyperlipidemia   . Injury of kidney 04/14/2014  . Muscle soreness 04/27/2014  . NSTEMI (non-ST elevated myocardial infarction) ([place]) 04/12/2014  . Obesity 04/14/2014  . Obstructive apnea 12/28/2014  . OSA on CPAP   . Osteoarthrosis 09/30/2014  . Primary insomnia 03/07/2016  . Pure hypercholesterolemia 12/28/2014  . SLEEP APNEA 02/24/2009   Qualifier: Diagnosis of  By: Annamaria Boots MD, [place] D      Social History   Tobacco Use  Smoking Status Former Smoker  . Last attempt to quit: 11/26/1982  . Years since quitting: 35.6  Smokeless Tobacco Never Used    Social History   Substance and Sexual Activity  Alcohol Use No     No Known Allergies  Current Outpatient Medications  Medication Sig Dispense Refill  . amiodarone (PACERONE) 400 MG tablet Take 0.5 tablets (200 mg total) by mouth 2 (two) times daily. 200 mg twice a day for 3 days, then 200 mg a day until seen back. 60 tablet 1  . aspirin EC 81 MG tablet Take 81 mg by mouth daily.    Marland Kitchen atorvastatin (LIPITOR) 40 MG tablet Take 1 tablet (40 mg  total) by mouth daily at 6 PM. 30 tablet 3  . cephALEXin (KEFLEX) 500 MG capsule Take 1 capsule (500 mg total) by mouth 3 (three) times daily. 30 capsule 0  . docusate sodium (COLACE) 250 MG capsule Take 250 mg by mouth daily as needed for constipation.     Marland Kitchen HYDROcodone-acetaminophen (NORCO/VICODIN) 5-325 MG per tablet Take 1 tablet by mouth 3 (three) times daily as needed for moderate pain.     . meloxicam (MOBIC) 15 MG tablet Take 15 mg by mouth daily.  1  . metoprolol succinate (TOPROL-XL) 50 MG 24 hr tablet Take 1 tablet (50 mg total) by mouth daily. 30  tablet 6  . nitroGLYCERIN (NITROSTAT) 0.4 MG SL tablet Place 1 tablet (0.4 mg total) under the tongue every 5 (five) minutes as needed for chest pain. 25 tablet 5  . pantoprazole (PROTONIX) 40 MG tablet Take 1 tablet (40 mg total) by mouth daily. 90 tablet 3  . traMADol (ULTRAM) 50 MG tablet Take 100 mg by mouth every 8 (eight) hours as needed for moderate pain.      No current facility-administered medications for this visit.        Physical Exam: BP 117/73 (BP Location: Left Arm, Patient Position: Sitting, Cuff Size: Normal)   Pulse 74   Temp (!) 97 F (36.1 C)   Resp 18   Ht 5\' 11"  (1.803 m)   Wt 116 kg   SpO2 98% Comment: RA  BMI 35.68 kg/m   Extremities: The right EVH site needs to have clear drainage.  There is no cellulitis.  I probed the wound with a Q-tip and it is improved from previous space that existed.   Diagnostic Studies & Laboratory data:     Recent Radiology Findings:   Dg Chest 2 View  Result Date: 07/07/2018 CLINICAL DATA:  Status post coronary artery bypass graft. EXAM: CHEST - 2 VIEW COMPARISON:  Radiographs of June 12, 2018. FINDINGS: Stable cardiomegaly. Status post coronary bypass graft. No pneumothorax or pleural effusion is noted. No acute pulmonary disease is noted. Bony thorax is unremarkable. IMPRESSION: No active cardiopulmonary disease. Electronically Signed   By: Marijo Conception, M.D.   On: 07/07/2018 15:17      Recent Lab Findings: Lab Results  Component Value Date   WBC 5.5 06/25/2018   HGB 11.5 (L) 06/25/2018   HCT 35.5 (L) 06/25/2018   PLT 328 06/25/2018   GLUCOSE 108 (H) 06/25/2018   CHOL 183 05/03/2016   TRIG 147 05/03/2016   HDL 39 (L) 05/03/2016   LDLCALC 115 05/03/2016   ALT 21 06/08/2018   AST 40 06/08/2018   NA 143 06/25/2018   K 5.1 06/25/2018   CL 107 (H) 06/25/2018   CREATININE 1.37 (H) 06/25/2018   BUN 11 06/25/2018   CO2 23 06/25/2018   TSH 4.936 (H) 06/11/2018   INR 1.21 06/06/2018   HGBA1C 6.0 (H) 06/05/2018        Assessment / Plan: Recommended resuming packing the wound as previously.  We will see again in 2 weeks for follow-up.  Chest x-ray done today shows no active cardiopulmonary disease.          John Giovanni, PA-C 07/07/2018 3:37 PM Pager [phone]

## 2018-07-07 NOTE — Patient Instructions (Signed)
To new packing the wound.

## 2018-07-10 ENCOUNTER — Telehealth: Payer: Self-pay

## 2018-07-10 NOTE — Telephone Encounter (Signed)
Herbert Deaner, Pt with [facility] called [phone] to say [name] has completed all of his goals with in home physical therapy.  He stated that he has done outpatient phase 2 cardiac rehab in the past and is not interested in going this time.  He stated that the patient seems to know what he needs to do and is interested in going to the [facility] for exercise.

## 2018-07-15 ENCOUNTER — Ambulatory Visit: Payer: Medicare HMO | Admitting: Nurse Practitioner

## 2018-07-15 ENCOUNTER — Encounter: Payer: Self-pay | Admitting: Nurse Practitioner

## 2018-07-15 VITALS — BP 124/68 | HR 79 | Ht 71.0 in | Wt 257.4 lb

## 2018-07-15 DIAGNOSIS — I2510 Atherosclerotic heart disease of native coronary artery without angina pectoris: Secondary | ICD-10-CM

## 2018-07-15 DIAGNOSIS — Z951 Presence of aortocoronary bypass graft: Secondary | ICD-10-CM

## 2018-07-15 DIAGNOSIS — I10 Essential (primary) hypertension: Secondary | ICD-10-CM

## 2018-07-15 DIAGNOSIS — I480 Paroxysmal atrial fibrillation: Secondary | ICD-10-CM | POA: Diagnosis not present

## 2018-07-15 DIAGNOSIS — E7800 Pure hypercholesterolemia, unspecified: Secondary | ICD-10-CM | POA: Diagnosis not present

## 2018-07-15 MED ORDER — ATORVASTATIN CALCIUM 40 MG PO TABS: 40.0000 mg | ORAL_TABLET | Freq: Every day | ORAL | 3 refills | Status: DC

## 2018-07-15 MED ORDER — ATORVASTATIN CALCIUM 40 MG PO TABS: 40.0000 mg | ORAL_TABLET | Freq: Every day | ORAL | 3 refills | Status: AC

## 2018-07-15 MED ORDER — METOPROLOL SUCCINATE ER 50 MG PO TB24: 50.0000 mg | ORAL_TABLET | Freq: Every day | ORAL | 3 refills | Status: DC

## 2018-07-15 NOTE — Patient Instructions (Addendum)
We will be checking the following labs today - NONE  Fasting labs tomorrow - BMET, CBC, HPF and lipids   Medication Instructions:    Continue with your current medicines. BUT  STOP AMIODARONE as of September 1st  I have refilled the Lipitor and Metoprolol to [facility]    Testing/Procedures To Be Arranged:  N/A  Follow-Up:   See Dr. Burt Knack as planned in November    Other Special Instructions:   [facility] - work up to walking 45 minutes a day    If you need a refill on your cardiac medications before your next appointment, please call your pharmacy.   Call the [place] office at [phone] if you have any questions, problems or concerns.

## 2018-07-15 NOTE — Progress Notes (Signed)
CARDIOLOGY OFFICE NOTE  Date:  07/15/2018    Allen James Date of Birth: [birth year] Medical Record #[id number]  PCP:  Delilah Shan, MD  Cardiologist:  Jerel Shepherd    Chief Complaint  Patient presents with  . Coronary Artery Disease    3 week check - seen for Dr. Burt Knack    History of Present Illness: Allen James is a 77 y.o. male who presents today for a 3 week check. Seen for Dr. Burt Knack.   He has a known history of HLD and known CAD with prior NSTEMI with DES to RCA x 2 in 2015.   Seen here back in June by Dr. Burt Knack with progressive chest discomfort. Referred for cardiac cath. This was performed on 05/30/2018 which showed mutlivessel CAD. LV function was normal.  It was felt coronary bypass grafting would be indicated and TCTS consult was obtained. He was evaluated by Dr. Prescott Gum. Plavix was held for 5 days. He was taken to the operating room and underwent CABG x 3 on 06/06/2018 with LIMA to LAD, SVG to Diagonal, and SVG to OM and endoscopic harvest of greater saphenous vein from his right leg. His post op course was eventful for AKI, PAF and volume overload.    I then saw him for his post hospital visit. Doing well other than some issues with his vein harvesting site - this required packing/antibiotics. Amiodarone was cut back at last visit.   Comes in today. Here with his wife. He is doing well. Asking about golf. No chest pain. Breathing is good. His leg looks better. He sees Dr. Prescott Gum next week. No palpitations. Not fasting today - had 2 sausage biscuits for breakfast. Not interested in cardiac rehab - [facility] BP is good. Remains off Imdur - this was a previous medicine. Notes easy bruising still with just 81 mg of aspirin. He was given a one week of Lasix/potassium - has one more day left. Finishing Keflex as well.   Past Medical History:  Diagnosis Date  . Abdominal hernia    pt. states he had it for 10  yrs or longer  . Abnormal findings on cardiac catheterization    ! Do NOT use RIGHT radial access on future caths or PCI.  Marland Kitchen Acute non-ST elevation myocardial infarction (NSTEMI) ([place]) 04/12/2014  . Acute non-ST segment elevation myocardial infarction ([place]) 04/12/2014  . Adiposity 04/14/2014  . AKI (acute kidney injury) ([place]) 04/14/2014  . Anginal pain ([place])   . Arthritis   . Back pain   . Benign essential hypertension 12/20/2016  . CAD (coronary artery disease)    a. NSTEMI 04/12/14 s/p DES x 2 to RCA. Prox 3.5x38 mm Xience Alpine DES, Dist 3.5x28 mm Xience DES; EF 65-70%. Staged PCI to the OM1 with 2.75 x 12 mm Resolute DES     . Chronic coronary artery disease 04/14/2014  . Chronic lower back pain 07/13/2014  . Dyspepsia 03/07/2016  . Dyspnea   . GERD (gastroesophageal reflux disease)   . H/O hiatal hernia   . Hyperlipidemia   . Injury of kidney 04/14/2014  . Muscle soreness 04/27/2014  . NSTEMI (non-ST elevated myocardial infarction) ([place]) 04/12/2014  . Obesity 04/14/2014  . Obstructive apnea 12/28/2014  . OSA on CPAP   . Osteoarthrosis 09/30/2014  . Primary insomnia 03/07/2016  . Pure hypercholesterolemia 12/28/2014  . SLEEP APNEA 02/24/2009   Qualifier: Diagnosis of  By: Annamaria Boots MD, [place].Proctor D     Past Surgical History:  Procedure Laterality Date  . APPENDECTOMY     Remote  . CARPAL TUNNEL RELEASE Bilateral   . CORONARY ANGIOPLASTY  04/12/14   STENT TO RCA  . CORONARY ARTERY BYPASS GRAFT N/A 06/06/2018   Procedure: CORONARY ARTERY BYPASS GRAFTING (CABG) x 3 WITH ENDOSCOPIC HARVESTING OF RIGHT SAPHENOUS VEIN;  Surgeon: Ivin Poot, MD;  Location: [facility];  Service: Open Heart Surgery;  Laterality: N/A;  . KNEE ARTHROSCOPY Bilateral   . LEFT HEART CATH AND CORONARY ANGIOGRAPHY N/A 05/30/2018   Procedure: LEFT HEART CATH AND CORONARY ANGIOGRAPHY;  Surgeon: Sherren Mocha, MD;  Location: [facility] CV LAB;  Service: Cardiovascular;  Laterality: N/A;  . LEFT HEART CATHETERIZATION WITH CORONARY  ANGIOGRAM N/A 04/12/2014   Procedure: LEFT HEART CATHETERIZATION WITH CORONARY ANGIOGRAM;  Surgeon: Blane Ohara, MD;  Location: [facility] CATH LAB;  Service: Cardiovascular;  Laterality: N/A;  . PERCUTANEOUS CORONARY STENT INTERVENTION (PCI-S)  04/12/2014   Procedure: PERCUTANEOUS CORONARY STENT INTERVENTION (PCI-S);  Surgeon: Blane Ohara, MD;  Location: [facility] CATH LAB;  Service: Cardiovascular;;  . PERCUTANEOUS CORONARY STENT INTERVENTION (PCI-S) N/A 04/14/2014   Procedure: PERCUTANEOUS CORONARY STENT INTERVENTION (PCI-S);  Surgeon: Peter M Martinique, MD;  Location: [facility] CATH LAB;  Service: Cardiovascular;  Laterality: N/A;  . TEE WITHOUT CARDIOVERSION N/A 06/06/2018   Procedure: TRANSESOPHAGEAL ECHOCARDIOGRAM (TEE);  Surgeon: Prescott Gum, Collier Salina, MD;  Location: [facility];  Service: Open Heart Surgery;  Laterality: N/A;     Medications: Current Meds  Medication Sig  . amiodarone (PACERONE) 200 MG tablet Take 200 mg by mouth daily.  Marland Kitchen aspirin EC 81 MG tablet Take 81 mg by mouth daily.  Marland Kitchen atorvastatin (LIPITOR) 40 MG tablet Take 1 tablet (40 mg total) by mouth daily at 6 PM.  . docusate sodium (COLACE) 250 MG capsule Take 250 mg by mouth daily as needed for constipation.   . furosemide (LASIX) 40 MG tablet Take 1 tablet (40 mg total) by mouth daily for 7 days.  Marland Kitchen HYDROcodone-acetaminophen (NORCO/VICODIN) 5-325 MG per tablet Take 1 tablet by mouth 3 (three) times daily as needed for moderate pain.   . meloxicam (MOBIC) 15 MG tablet Take 15 mg by mouth daily.  . metoprolol succinate (TOPROL-XL) 50 MG 24 hr tablet Take 1 tablet (50 mg total) by mouth daily.  . nitroGLYCERIN (NITROSTAT) 0.4 MG SL tablet Place 1 tablet (0.4 mg total) under the tongue every 5 (five) minutes as needed for chest pain.  . pantoprazole (PROTONIX) 40 MG tablet Take 1 tablet (40 mg total) by mouth daily.  . potassium chloride 20 MEQ TBCR Take 20 mEq by mouth daily.  . traMADol (ULTRAM) 50 MG tablet Take 100 mg by mouth every 8 (eight)  hours as needed for moderate pain.   . [DISCONTINUED] atorvastatin (LIPITOR) 40 MG tablet Take 1 tablet (40 mg total) by mouth daily at 6 PM.  . [DISCONTINUED] metoprolol succinate (TOPROL-XL) 50 MG 24 hr tablet Take 1 tablet (50 mg total) by mouth daily.     Allergies: No Known Allergies  Social History: The patient  reports that he quit smoking about 35 years ago. He has never used smokeless tobacco. He reports that he does not drink alcohol or use drugs.   Family History: The patient's family history includes Coronary artery disease (age of onset: 54) in his mother.   Review of Systems: Please see the history of present illness.   Otherwise,  the review of systems is positive for none.   All other systems are reviewed and negative.   Physical Exam: VS:  BP 124/68 (BP Location: Left Arm, Patient Position: Sitting, Cuff Size: Normal)   Pulse 79   Ht 5\' 11"  (1.803 m)   Wt 257 lb 6.4 oz (116.8 kg)   BMI 35.90 kg/m  .  BMI Body mass index is 35.9 kg/m.  Wt Readings from Last 3 Encounters:  07/15/18 257 lb 6.4 oz (116.8 kg)  07/07/18 255 lb 12.8 oz (116 kg)  07/01/18 256 lb (116.1 kg)    General: Pleasant. Well developed, well nourished and in no acute distress.   HEENT: Normal.  Neck: Supple, no JVD, carotid bruits, or masses noted.  Cardiac: Regular rate and rhythm. No murmurs, rubs, or gallops. No edema. Sternum looks good. Vein harvesting site looks better.  Respiratory:  Lungs are clear to auscultation bilaterally with normal work of breathing.  GI: Soft and nontender.  MS: No deformity or atrophy. Gait and ROM intact.  Skin: Warm and dry. Color is normal.  Neuro:  Strength and sensation are intact and no gross focal deficits noted.  Psych: Alert, appropriate and with normal affect.   LABORATORY DATA:  EKG:  EKG is ordered today. This demonstrates NSR with lateral T wave changes.  Lab Results  Component Value Date   WBC 5.5 06/25/2018   HGB 11.5 (L) 06/25/2018    HCT 35.5 (L) 06/25/2018   PLT 328 06/25/2018   GLUCOSE 108 (H) 06/25/2018   CHOL 183 05/03/2016   TRIG 147 05/03/2016   HDL 39 (L) 05/03/2016   LDLCALC 115 05/03/2016   ALT 21 06/08/2018   AST 40 06/08/2018   NA 143 06/25/2018   K 5.1 06/25/2018   CL 107 (H) 06/25/2018   CREATININE 1.37 (H) 06/25/2018   BUN 11 06/25/2018   CO2 23 06/25/2018   TSH 4.936 (H) 06/11/2018   INR 1.21 06/06/2018   HGBA1C 6.0 (H) 06/05/2018     BNP (last 3 results) No results for input(s): BNP in the last 8760 hours.  ProBNP (last 3 results) No results for input(s): PROBNP in the last 8760 hours.   Other Studies Reviewed Today:   Assessment/Plan:  LEFT HEART CATH AND CORONARY ANGIOGRAPHY 05/2018  Conclusion     The left ventricular systolic function is normal.  LV end diastolic pressure is mildly elevated.  The left ventricular ejection fraction is 55-65% by visual estimate.  Ost LAD lesion is 90% stenosed.  Prox LAD lesion is 50% stenosed.  Mid LAD lesion is 40% stenosed.  2nd Diag lesion is 40% stenosed.  Ost 1st Mrg lesion is 50% stenosed.  Previously placed 1st Mrg stent (unknown type) is widely patent.  Previously placed Prox RCA to Mid RCA stent (unknown type) is widely patent.  Previously placed Mid RCA to Dist RCA stent (unknown type) is widely patent.  Mid RCA lesion is 50% stenosed.  1. Severe ostial LAD stenosis with mild to moderate proximal and mid LAD stenosis 2. Patent left main with no significant stenosis 3. Patent left circumflex stent (first OM) with moderate stenosis proximal to the stented segment 4. Patent RCA stents with mild to moderate stenosis just between the stents in the mid RCA 5. Normal LV function  Recommendations: Recommend cardiac surgical consultation for consideration of CABG. Patient has diffuse coronary artery disease and severe stenosis involving the true ostium of the LAD. I think he would benefit from surgical  revascularization.  He will be instructed to discontinue clopidogrel in anticipation of CABG. Will refer for outpatient surgical consult.    CABG 06/06/2018  PROCEDURE PERFORMED: 1. Coronary artery bypass grafting x3 (left internal mammary artery to left anterior descending, saphenous vein graft to diagonal, saphenous vein graft to circumflex marginal). 2. Endoscopic harvest of right leg greater saphenous vein.   Assessment/Plan:  1. CAD - prior NSTEMI with DES x 2 to the RCA in 2015 - recently with progressive angina - s/p cath with subsequent CABG x 3 - he is now about 5 weeks out - he is doing well[facility] - does not wish to go back to cardiac rehab. Would hold on swinging a golf club.   2. Post of AF - remains in sinus - I am stopping amiodarone as of September 1  3. Post op volume overload - given another 1 week course of Lasix - will finish this tomorrow. CXR was ok. Lab tomorrow.   4. HTN - BP looks fine on his current regimen  5. HLD - remains on statin therapy - he is not fasting today - will check tomorrow  6. Obesity - discussed at length   Current medicines are reviewed with the patient today.  The patient does not have concerns regarding medicines other than what has been noted above.  The following changes have been made:  See above.  Labs/ tests ordered today include:    Orders Placed This Encounter  Procedures  . Basic metabolic panel  . CBC  . Hepatic function panel  . Lipid panel  . EKG 12-Lead     Disposition:   FU with Dr. Burt Knack in 3 months. I am happy to see back as needed.   Patient is agreeable to this plan and will call if any problems develop in the interim.   SignedTruitt Merle, NP  07/15/2018 10:46 AM  [facility] [address] [place], [place]  [address] Phone: [phone] Fax: [phone]

## 2018-07-16 ENCOUNTER — Other Ambulatory Visit: Payer: Medicare HMO | Admitting: *Deleted

## 2018-07-16 DIAGNOSIS — I10 Essential (primary) hypertension: Secondary | ICD-10-CM

## 2018-07-16 DIAGNOSIS — Z951 Presence of aortocoronary bypass graft: Secondary | ICD-10-CM

## 2018-07-16 DIAGNOSIS — I2510 Atherosclerotic heart disease of native coronary artery without angina pectoris: Secondary | ICD-10-CM

## 2018-07-16 LAB — CBC
Hematocrit: 39 % (ref 37.5–51.0)
Hemoglobin: 12.9 g/dL — ABNORMAL LOW (ref 13.0–17.7)
MCH: 31.2 pg (ref 26.6–33.0)
MCHC: 33.1 g/dL (ref 31.5–35.7)
MCV: 94 fL (ref 79–97)
Platelets: 253 10*3/uL (ref 150–450)
RBC: 4.14 x10E6/uL (ref 4.14–5.80)
RDW: 14 % (ref 12.3–15.4)
WBC: 5.2 10*3/uL (ref 3.4–10.8)

## 2018-07-16 LAB — BASIC METABOLIC PANEL
BUN/Creatinine Ratio: 15 (ref 10–24)
BUN: 18 mg/dL (ref 8–27)
CO2: 22 mmol/L (ref 20–29)
Calcium: 9 mg/dL (ref 8.6–10.2)
Chloride: 102 mmol/L (ref 96–106)
Creatinine, Ser: 1.23 mg/dL (ref 0.76–1.27)
GFR calc Af Amer: 66 mL/min/{1.73_m2} (ref >59)
GFR calc non Af Amer: 57 mL/min/{1.73_m2} — ABNORMAL LOW (ref >59)
Glucose: 114 mg/dL — ABNORMAL HIGH (ref 65–99)
Potassium: 4.6 mmol/L (ref 3.5–5.2)
Sodium: 140 mmol/L (ref 134–144)

## 2018-07-16 LAB — LIPID PANEL
Chol/HDL Ratio: 3 ratio (ref 0.0–5.0)
Cholesterol, Total: 136 mg/dL (ref 100–199)
HDL: 46 mg/dL (ref >39)
LDL Calculated: 74 mg/dL (ref 0–99)
Triglycerides: 79 mg/dL (ref 0–149)
VLDL Cholesterol Cal: 16 mg/dL (ref 5–40)

## 2018-07-16 LAB — HEPATIC FUNCTION PANEL
ALT: 17 IU/L (ref 0–44)
AST: 16 IU/L (ref 0–40)
Albumin: 4 g/dL (ref 3.5–4.8)
Alkaline Phosphatase: 98 IU/L (ref 39–117)
Bilirubin Total: 0.5 mg/dL (ref 0.0–1.2)
Bilirubin, Direct: 0.18 mg/dL (ref 0.00–0.40)
Total Protein: 7 g/dL (ref 6.0–8.5)

## 2018-07-23 ENCOUNTER — Encounter: Payer: Self-pay | Admitting: Cardiothoracic Surgery

## 2018-07-23 ENCOUNTER — Other Ambulatory Visit: Payer: Self-pay

## 2018-07-23 ENCOUNTER — Ambulatory Visit (INDEPENDENT_AMBULATORY_CARE_PROVIDER_SITE_OTHER): Payer: Self-pay | Admitting: Cardiothoracic Surgery

## 2018-07-23 VITALS — BP 120/58 | HR 82 | Resp 18 | Ht 71.0 in | Wt 258.0 lb

## 2018-07-23 DIAGNOSIS — Z951 Presence of aortocoronary bypass graft: Secondary | ICD-10-CM

## 2018-07-23 MED ORDER — TRAMADOL HCL 50 MG PO TABS: 50.0000 mg | ORAL_TABLET | Freq: Four times a day (QID) | ORAL | 0 refills | Status: DC | PRN

## 2018-07-23 MED ORDER — TRAMADOL HCL 50 MG PO TABS: 100.0000 mg | ORAL_TABLET | Freq: Two times a day (BID) | ORAL | 2 refills | Status: AC

## 2018-07-23 NOTE — Progress Notes (Signed)
  HPI: Patient returns for routine postoperative follow-up having undergone CABG x3 on July 12 The patient's early postoperative recovery while in the hospital was notable for transient atrial fibrillation and poor mobility Since hospital discharge the patient reports significant improvement in exercise tolerance, no symptoms of angina, surgical incision is healing well, and no sternal clicking.  The patient is driving, walking almost daily, and ready to start a light activity program at the [facility].  He knows not to lift more than 20 pounds until 3 months after surgery, mid October.  He remains in sinus rhythm and his long-term medications will include aspirin, Lipitor, metoprolol and as needed tramadol.   Current Outpatient Medications  Medication Sig Dispense Refill  . amiodarone (PACERONE) 200 MG tablet Take 200 mg by mouth daily.    Marland Kitchen aspirin EC 81 MG tablet Take 81 mg by mouth daily.    Marland Kitchen atorvastatin (LIPITOR) 40 MG tablet Take 1 tablet (40 mg total) by mouth daily at 6 PM. 90 tablet 3  . docusate sodium (COLACE) 250 MG capsule Take 250 mg by mouth daily as needed for constipation.     Marland Kitchen HYDROcodone-acetaminophen (NORCO/VICODIN) 5-325 MG per tablet Take 1 tablet by mouth 3 (three) times daily as needed for moderate pain.     . meloxicam (MOBIC) 15 MG tablet Take 15 mg by mouth daily.  1  . metoprolol succinate (TOPROL-XL) 50 MG 24 hr tablet Take 1 tablet (50 mg total) by mouth daily. 90 tablet 3  . nitroGLYCERIN (NITROSTAT) 0.4 MG SL tablet Place 1 tablet (0.4 mg total) under the tongue every 5 (five) minutes as needed for chest pain. 25 tablet 5  . pantoprazole (PROTONIX) 40 MG tablet Take 1 tablet (40 mg total) by mouth daily. 90 tablet 3  . potassium chloride 20 MEQ TBCR Take 20 mEq by mouth daily. 7 tablet 0  . traMADol (ULTRAM) 50 MG tablet Take 2 tablets (100 mg total) by mouth 2 (two) times daily. 60 tablet 2  . furosemide (LASIX) 40 MG tablet Take 1 tablet (40 mg total) by mouth  daily for 7 days. 7 tablet 0  . traMADol (ULTRAM) 50 MG tablet Take 1 tablet (50 mg total) by mouth every 6 (six) hours as needed. 40 tablet 0   No current facility-administered medications for this visit.     Physical Exam: Vital signs stable BP 130/80 Lungs clear Heart rate regular Right lower leg incision has stopped draining and is healing  Diagnostic Tests: None  Impression: Doing well 5 weeks after multivessel CABG. We discussed his recommended heart healthy diet and lifestyle, change in medications, and limitations in lifting.  Plan: Return in 8 weeks for final visit and review of progress.   Len Childs, MD [facility] [phone]

## 2018-09-24 ENCOUNTER — Ambulatory Visit: Payer: Medicare HMO | Admitting: Cardiothoracic Surgery

## 2018-10-01 ENCOUNTER — Other Ambulatory Visit: Payer: Self-pay

## 2018-10-01 ENCOUNTER — Ambulatory Visit: Payer: Medicare HMO | Admitting: Cardiothoracic Surgery

## 2018-10-01 ENCOUNTER — Encounter: Payer: Self-pay | Admitting: Cardiothoracic Surgery

## 2018-10-01 VITALS — BP 117/73 | HR 74 | Resp 16 | Ht 71.0 in | Wt 258.0 lb

## 2018-10-01 DIAGNOSIS — Z951 Presence of aortocoronary bypass graft: Secondary | ICD-10-CM

## 2018-10-01 DIAGNOSIS — I2510 Atherosclerotic heart disease of native coronary artery without angina pectoris: Secondary | ICD-10-CM

## 2018-10-01 NOTE — Progress Notes (Signed)
PCP is Delilah Shan, MD Referring Provider is Sherren Mocha, MD  Chief Complaint  Patient presents with  . Routine Post Op    2 month f/u s/p CABG X 3... 06/06/18...final check    HPI: 77 year old patient returns for final postop follow-up after urgent CABG x3 this past summer.  He has been doing well.  His atrial fibrillation has resolved.  He has finished a cardiac rehab program at the [facility] where he is now on a maintenance program.  He feels much stronger and has better exercise tolerance since surgery.  Last chest x-ray is clear.  The serous drainage from his leg incision has resolved and he has only mild ankle edema.   Past Medical History:  Diagnosis Date  . Abdominal hernia    pt. states he had it for 10 yrs or longer  . Abnormal findings on cardiac catheterization    ! Do NOT use RIGHT radial access on future caths or PCI.  Marland Kitchen Acute non-ST elevation myocardial infarction (NSTEMI) ([place]) 04/12/2014  . Acute non-ST segment elevation myocardial infarction ([place]) 04/12/2014  . Adiposity 04/14/2014  . AKI (acute kidney injury) ([place]) 04/14/2014  . Anginal pain ([place])   . Arthritis   . Back pain   . Benign essential hypertension 12/20/2016  . CAD (coronary artery disease)    a. NSTEMI 04/12/14 s/p DES x 2 to RCA. Prox 3.5x38 mm Xience Alpine DES, Dist 3.5x28 mm Xience DES; EF 65-70%. Staged PCI to the OM1 with 2.75 x 12 mm Resolute DES     . Chronic coronary artery disease 04/14/2014  . Chronic lower back pain 07/13/2014  . Dyspepsia 03/07/2016  . Dyspnea   . GERD (gastroesophageal reflux disease)   . H/O hiatal hernia   . Hyperlipidemia   . Injury of kidney 04/14/2014  . Muscle soreness 04/27/2014  . NSTEMI (non-ST elevated myocardial infarction) ([place]) 04/12/2014  . Obesity 04/14/2014  . Obstructive apnea 12/28/2014  . OSA on CPAP   . Osteoarthrosis 09/30/2014  . Primary insomnia 03/07/2016  . Pure hypercholesterolemia 12/28/2014  . SLEEP APNEA 02/24/2009   Qualifier: Diagnosis of  By: Annamaria Boots  MD, Kasandra Knudsen     Past Surgical History:  Procedure Laterality Date  . APPENDECTOMY     Remote  . CARPAL TUNNEL RELEASE Bilateral   . CORONARY ANGIOPLASTY  04/12/14   STENT TO RCA  . CORONARY ARTERY BYPASS GRAFT N/A 06/06/2018   Procedure: CORONARY ARTERY BYPASS GRAFTING (CABG) x 3 WITH ENDOSCOPIC HARVESTING OF RIGHT SAPHENOUS VEIN;  Surgeon: Ivin Poot, MD;  Location: [place];  Service: Open Heart Surgery;  Laterality: N/A;  . KNEE ARTHROSCOPY Bilateral   . LEFT HEART CATH AND CORONARY ANGIOGRAPHY N/A 05/30/2018   Procedure: LEFT HEART CATH AND CORONARY ANGIOGRAPHY;  Surgeon: Sherren Mocha, MD;  Location: [place] CV LAB;  Service: Cardiovascular;  Laterality: N/A;  . LEFT HEART CATHETERIZATION WITH CORONARY ANGIOGRAM N/A 04/12/2014   Procedure: LEFT HEART CATHETERIZATION WITH CORONARY ANGIOGRAM;  Surgeon: Blane Ohara, MD;  Location: [facility] CATH LAB;  Service: Cardiovascular;  Laterality: N/A;  . PERCUTANEOUS CORONARY STENT INTERVENTION (PCI-S)  04/12/2014   Procedure: PERCUTANEOUS CORONARY STENT INTERVENTION (PCI-S);  Surgeon: Blane Ohara, MD;  Location: [facility] CATH LAB;  Service: Cardiovascular;;  . PERCUTANEOUS CORONARY STENT INTERVENTION (PCI-S) N/A 04/14/2014   Procedure: PERCUTANEOUS CORONARY STENT INTERVENTION (PCI-S);  Surgeon: Cuthbert Turton M Martinique, MD;  Location: [facility] CATH LAB;  Service: Cardiovascular;  Laterality: N/A;  . TEE WITHOUT CARDIOVERSION N/A  06/06/2018   Procedure: TRANSESOPHAGEAL ECHOCARDIOGRAM (TEE);  Surgeon: Prescott Gum, Collier Salina, MD;  Location: [place];  Service: Open Heart Surgery;  Laterality: N/A;    Family History  Problem Relation Age of Onset  . Coronary artery disease Mother 45    Social History Social History   Tobacco Use  . Smoking status: Former Smoker    Last attempt to quit: 11/26/1982    Years since quitting: 35.8  . Smokeless tobacco: Never Used  Substance Use Topics  . Alcohol use: No  . Drug use: No    Current Outpatient Medications   Medication Sig Dispense Refill  . aspirin EC 81 MG tablet Take 81 mg by mouth daily.    Marland Kitchen atorvastatin (LIPITOR) 40 MG tablet Take 1 tablet (40 mg total) by mouth daily at 6 PM. 90 tablet 3  . docusate sodium (COLACE) 250 MG capsule Take 250 mg by mouth daily as needed for constipation.     Marland Kitchen HYDROcodone-acetaminophen (NORCO/VICODIN) 5-325 MG per tablet Take 1 tablet by mouth 3 (three) times daily as needed for moderate pain.     . meloxicam (MOBIC) 15 MG tablet Take 15 mg by mouth daily.  1  . metoprolol succinate (TOPROL-XL) 50 MG 24 hr tablet Take 1 tablet (50 mg total) by mouth daily. 90 tablet 3  . nitroGLYCERIN (NITROSTAT) 0.4 MG SL tablet Place 1 tablet (0.4 mg total) under the tongue every 5 (five) minutes as needed for chest pain. 25 tablet 5  . pantoprazole (PROTONIX) 40 MG tablet Take 1 tablet (40 mg total) by mouth daily. 90 tablet 3  . traMADol (ULTRAM) 50 MG tablet Take 2 tablets (100 mg total) by mouth 2 (two) times daily. 60 tablet 2   No current facility-administered medications for this visit.     No Known Allergies  Review of Systems   Improved appetite and strength Walks daily about 2 miles No angina or shortness of breath  BP 117/73 (BP Location: Right Arm, Patient Position: Sitting, Cuff Size: Large)   Pulse 74   Resp 16   Ht 5\' 11"  (1.803 m)   Wt 258 lb (117 kg)   SpO2 96% Comment: ON RA  BMI 35.98 kg/m  Physical Exam      Exam    General- alert and comfortable    Neck- no JVD, no cervical adenopathy palpable, no carotid bruit   Lungs- clear without rales, wheezes   Cor- regular rate and rhythm, no murmur , gallop   Abdomen- soft, non-tender   Extremities - warm, non-tender, minimal edema   Neuro- oriented, appropriate, no focal weakness   Diagnostic Tests: None  Impression: Excellent recovery following multivessel CABG  Plan: Continue current medications.  Importance of heart healthy diet and lifestyle emphasized with patient and strategies  discussed to comply with these recommendations.  Return as needed.   Len Childs, MD [facility] [phone]

## 2018-10-15 ENCOUNTER — Ambulatory Visit: Payer: Medicare HMO | Admitting: Cardiovascular Disease

## 2018-10-15 ENCOUNTER — Encounter: Payer: Self-pay | Admitting: Cardiovascular Disease

## 2018-10-15 VITALS — BP 144/76 | HR 82 | Ht 71.0 in | Wt 263.8 lb

## 2018-10-15 DIAGNOSIS — I10 Essential (primary) hypertension: Secondary | ICD-10-CM | POA: Diagnosis not present

## 2018-10-15 DIAGNOSIS — I2510 Atherosclerotic heart disease of native coronary artery without angina pectoris: Secondary | ICD-10-CM | POA: Diagnosis not present

## 2018-10-15 DIAGNOSIS — E782 Mixed hyperlipidemia: Secondary | ICD-10-CM

## 2018-10-15 NOTE — Patient Instructions (Signed)
Medication Instructions:  Your provider recommends that you continue on your current medications as directed. Please refer to the Current Medication list given to you today.    Labwork: None  Testing/Procedures: None  Follow-Up: You have an appointment scheduled with [name], NP on 04/15/19 at 9:00AM.  Any Other Special Instructions Will Be Listed Below (If Applicable).     If you need a refill on your cardiac medications before your next appointment, please call your pharmacy.

## 2018-10-15 NOTE — Progress Notes (Signed)
Cardiology Office Note:    Date:  10/15/2018   ID:  Allen James, DOB [birth year], MRN [id number]  PCP:  Delilah Shan, MD  Cardiologist:  Sherren Mocha, MD  Electrophysiologist:  None   Referring MD: Delilah Shan, MD   Chief Complaint  Patient presents with  . Coronary Artery Disease    History of Present Illness:    Allen James is a 77 y.o. male with a hx of coronary artery disease, presenting for follow-up evaluation.  The patient has a history of PCI in the past.  He developed progressive angina in July 2019 and underwent cardiac catheterization demonstrating multivessel CAD with severe stenosis at the LAD ostium.  LV function has been normal.  He was referred for cardiac surgery and underwent three-vessel CABG with a LIMA to LAD, saphenous vein graft to diagonal, and saphenous vein graft to obtuse marginal.  The patient is here alone today.  He is doing very well.  He denies chest pain, shortness of breath, leg swelling, heart palpitations, lightheadedness, or presyncope.  He is very pleased with his course after bypass surgery.  When he was last seen by [name] he was taken off of furosemide and amiodarone.  He has had no problems since then.  Past Medical History:  Diagnosis Date  . Abdominal hernia    pt. states he had it for 10 yrs or longer  . Abnormal findings on cardiac catheterization    ! Do NOT use RIGHT radial access on future caths or PCI.  Marland Kitchen Acute non-ST elevation myocardial infarction (NSTEMI) ([place]) 04/12/2014  . Acute non-ST segment elevation myocardial infarction ([place]) 04/12/2014  . Adiposity 04/14/2014  . AKI (acute kidney injury) ([place]) 04/14/2014  . Anginal pain ([place])   . Arthritis   . Back pain   . Benign essential hypertension 12/20/2016  . CAD (coronary artery disease)    a. NSTEMI 04/12/14 s/p DES x 2 to RCA. Prox 3.5x38 mm Xience Alpine DES, Dist 3.5x28 mm Xience DES; EF 65-70%. Staged PCI to the OM1 with 2.75 x 12 mm Resolute DES       . Chronic coronary artery disease 04/14/2014  . Chronic lower back pain 07/13/2014  . Dyspepsia 03/07/2016  . Dyspnea   . GERD (gastroesophageal reflux disease)   . H/O hiatal hernia   . Hyperlipidemia   . Injury of kidney 04/14/2014  . Muscle soreness 04/27/2014  . NSTEMI (non-ST elevated myocardial infarction) ([place]) 04/12/2014  . Obesity 04/14/2014  . Obstructive apnea 12/28/2014  . OSA on CPAP   . Osteoarthrosis 09/30/2014  . Primary insomnia 03/07/2016  . Pure hypercholesterolemia 12/28/2014  . SLEEP APNEA 02/24/2009   Qualifier: Diagnosis of  By: Annamaria Boots MD, Kasandra Knudsen     Past Surgical History:  Procedure Laterality Date  . APPENDECTOMY     Remote  . CARPAL TUNNEL RELEASE Bilateral   . CORONARY ANGIOPLASTY  04/12/14   STENT TO RCA  . CORONARY ARTERY BYPASS GRAFT N/A 06/06/2018   Procedure: CORONARY ARTERY BYPASS GRAFTING (CABG) x 3 WITH ENDOSCOPIC HARVESTING OF RIGHT SAPHENOUS VEIN;  Surgeon: Ivin Poot, MD;  Location: [place];  Service: Open Heart Surgery;  Laterality: N/A;  . KNEE ARTHROSCOPY Bilateral   . LEFT HEART CATH AND CORONARY ANGIOGRAPHY N/A 05/30/2018   Procedure: LEFT HEART CATH AND CORONARY ANGIOGRAPHY;  Surgeon: Sherren Mocha, MD;  Location: [place] CV LAB;  Service: Cardiovascular;  Laterality: N/A;  . LEFT HEART CATHETERIZATION WITH CORONARY ANGIOGRAM N/A  04/12/2014   Procedure: LEFT HEART CATHETERIZATION WITH CORONARY ANGIOGRAM;  Surgeon: Blane Ohara, MD;  Location: [facility] CATH LAB;  Service: Cardiovascular;  Laterality: N/A;  . PERCUTANEOUS CORONARY STENT INTERVENTION (PCI-S)  04/12/2014   Procedure: PERCUTANEOUS CORONARY STENT INTERVENTION (PCI-S);  Surgeon: Blane Ohara, MD;  Location: [facility] CATH LAB;  Service: Cardiovascular;;  . PERCUTANEOUS CORONARY STENT INTERVENTION (PCI-S) N/A 04/14/2014   Procedure: PERCUTANEOUS CORONARY STENT INTERVENTION (PCI-S);  Surgeon: Peter M Martinique, MD;  Location: [facility] CATH LAB;  Service: Cardiovascular;  Laterality: N/A;  . TEE  WITHOUT CARDIOVERSION N/A 06/06/2018   Procedure: TRANSESOPHAGEAL ECHOCARDIOGRAM (TEE);  Surgeon: Prescott Gum, Collier Salina, MD;  Location: [place];  Service: Open Heart Surgery;  Laterality: N/A;    Current Medications: Current Meds  Medication Sig  . aspirin EC 81 MG tablet Take 81 mg by mouth daily.  Marland Kitchen atorvastatin (LIPITOR) 40 MG tablet Take 1 tablet (40 mg total) by mouth daily at 6 PM.  . docusate sodium (COLACE) 250 MG capsule Take 250 mg by mouth daily as needed for constipation.   Marland Kitchen HYDROcodone-acetaminophen (NORCO/VICODIN) 5-325 MG per tablet Take 1 tablet by mouth 3 (three) times daily as needed for moderate pain.   . meloxicam (MOBIC) 15 MG tablet Take 15 mg by mouth daily.  . metoprolol succinate (TOPROL-XL) 50 MG 24 hr tablet Take 1 tablet (50 mg total) by mouth daily.  . nitroGLYCERIN (NITROSTAT) 0.4 MG SL tablet Place 1 tablet (0.4 mg total) under the tongue every 5 (five) minutes as needed for chest pain.  . pantoprazole (PROTONIX) 40 MG tablet Take 1 tablet (40 mg total) by mouth daily.  . traMADol (ULTRAM) 50 MG tablet Take 2 tablets (100 mg total) by mouth 2 (two) times daily.     Allergies:   Patient has no known allergies.   Social History   Socioeconomic History  . Marital status: Married    Spouse name: Not on file  . Number of children: Not on file  . Years of education: Not on file  . Highest education level: Not on file  Occupational History  . Not on file  Social Needs  . Financial resource strain: Not on file  . Food insecurity:    Worry: Not on file    Inability: Not on file  . Transportation needs:    Medical: Not on file    Non-medical: Not on file  Tobacco Use  . Smoking status: Former Smoker    Last attempt to quit: 11/26/1982    Years since quitting: 35.9  . Smokeless tobacco: Never Used  Substance and Sexual Activity  . Alcohol use: No  . Drug use: No  . Sexual activity: Not on file  Lifestyle  . Physical activity:    Days per week: Not on file      Minutes per session: Not on file  . Stress: Not on file  Relationships  . Social connections:    Talks on phone: Not on file    Gets together: Not on file    Attends religious service: Not on file    Active member of club or organization: Not on file    Attends meetings of clubs or organizations: Not on file    Relationship status: Not on file  Other Topics Concern  . Not on file  Social History Narrative  . Not on file     Family History: The patient's family history includes Coronary artery disease (age of onset: 29) in his mother.  ROS:   Please see the history of present illness.    All other systems reviewed and are negative.  EKGs/Labs/Other Studies Reviewed:    The following studies were reviewed today: Cardiac Cath 05-30-2018: Conclusion     The left ventricular systolic function is normal.  LV end diastolic pressure is mildly elevated.  The left ventricular ejection fraction is 55-65% by visual estimate.  Ost LAD lesion is 90% stenosed.  Prox LAD lesion is 50% stenosed.  Mid LAD lesion is 40% stenosed.  2nd Diag lesion is 40% stenosed.  Ost 1st Mrg lesion is 50% stenosed.  Previously placed 1st Mrg stent (unknown type) is widely patent.  Previously placed Prox RCA to Mid RCA stent (unknown type) is widely patent.  Previously placed Mid RCA to Dist RCA stent (unknown type) is widely patent.  Mid RCA lesion is 50% stenosed.   1.  Severe ostial LAD stenosis with mild to moderate proximal and mid LAD stenosis 2.  Patent left main with no significant stenosis 3.  Patent left circumflex stent (first OM) with moderate stenosis proximal to the stented segment 4.  Patent RCA stents with mild to moderate stenosis just between the stents in the mid RCA 5.  Normal LV function  Recommendations: Recommend cardiac surgical consultation for consideration of CABG.  Patient has diffuse coronary artery disease and severe stenosis involving the true ostium of the  LAD.  I think he would benefit from surgical revascularization.  He will be instructed to discontinue clopidogrel in anticipation of CABG.  Will refer for outpatient surgical consult.     EKG:  EKG is not ordered today.    Recent Labs: 06/11/2018: Magnesium 2.1; TSH 4.936 07/16/2018: ALT 17; BUN 18; Creatinine, Ser 1.23; Hemoglobin 12.9; Platelets 253; Potassium 4.6; Sodium 140  Recent Lipid Panel    Component Value Date/Time   CHOL 136 07/16/2018 0849   TRIG 79 07/16/2018 0849   HDL 46 07/16/2018 0849   CHOLHDL 3.0 07/16/2018 0849   CHOLHDL 4.7 05/03/2016 0859   VLDL 29 05/03/2016 0859   LDLCALC 74 07/16/2018 0849    Physical Exam:    VS:  BP (!) 144/76   Pulse 82   Ht 5\' 11"  (1.803 m)   Wt 263 lb 12.8 oz (119.7 kg)   SpO2 97%   BMI 36.79 kg/m     Wt Readings from Last 3 Encounters:  10/15/18 263 lb 12.8 oz (119.7 kg)  10/01/18 258 lb (117 kg)  07/23/18 258 lb (117 kg)     GEN: Well nourished, well developed in no acute distress HEENT: Normal NECK: No JVD; No carotid bruits LYMPHATICS: No lymphadenopathy CARDIAC: RRR, no murmurs, rubs, gallops RESPIRATORY:  Clear to auscultation without rales, wheezing or rhonchi  ABDOMEN: Soft, non-tender, non-distended MUSCULOSKELETAL:  No edema; No deformity  SKIN: Warm and dry NEUROLOGIC:  Alert and oriented x 3 PSYCHIATRIC:  Normal affect   ASSESSMENT:    1. Coronary artery disease involving native coronary artery of native heart without angina pectoris   2. Mixed hyperlipidemia   3. Essential hypertension    PLAN:    In order of problems listed above:  1. The patient is doing very well without symptoms of angina.  He continues on antiplatelet therapy with aspirin, lipid-lowering with a high intensity statin drug, and he is tolerating a beta-blocker. 2. Lipids reviewed as above.  Treated with atorvastatin 40 mg. 3. Continue metoprolol succinate  Medication Adjustments/Labs and Tests Ordered: Current medicines are  reviewed  at length with the patient today.  Concerns regarding medicines are outlined above.  No orders of the defined types were placed in this encounter.  No orders of the defined types were placed in this encounter.   Patient Instructions  Medication Instructions:  Your provider recommends that you continue on your current medications as directed. Please refer to the Current Medication list given to you today.    Labwork: None  Testing/Procedures: None  Follow-Up: You have an appointment scheduled with [name], NP on 04/15/19 at 9:00AM.  Any Other Special Instructions Will Be Listed Below (If Applicable).     If you need a refill on your cardiac medications before your next appointment, please call your pharmacy.      Signed, Sherren Mocha, MD  10/15/2018 11:12 AM    [place]

## 2018-12-08 ENCOUNTER — Encounter (INDEPENDENT_AMBULATORY_CARE_PROVIDER_SITE_OTHER): Payer: Self-pay | Admitting: Physician Assistant

## 2018-12-08 ENCOUNTER — Ambulatory Visit (INDEPENDENT_AMBULATORY_CARE_PROVIDER_SITE_OTHER): Payer: Medicare HMO | Admitting: Physician Assistant

## 2018-12-08 ENCOUNTER — Ambulatory Visit (INDEPENDENT_AMBULATORY_CARE_PROVIDER_SITE_OTHER): Payer: Medicare HMO

## 2018-12-08 DIAGNOSIS — M25511 Pain in right shoulder: Secondary | ICD-10-CM

## 2018-12-08 MED ORDER — LIDOCAINE HCL 1 % IJ SOLN
3.0000 mL | INTRAMUSCULAR | Status: AC | PRN
  Administered 2018-12-08: 3 mL

## 2018-12-08 MED ORDER — METHYLPREDNISOLONE ACETATE 40 MG/ML IJ SUSP
40.0000 mg | INTRAMUSCULAR | Status: AC | PRN
  Administered 2018-12-08: 40 mg via INTRA_ARTICULAR

## 2018-12-08 NOTE — Progress Notes (Signed)
Office Visit Note   Patient: Allen James           Date of Birth: 11-Jun-1941           MRN: [id number] Visit Date: 12/08/2018              Requested by: Delilah Shan, MD [address] [place], [place] [address] PCP: Delilah Shan, MD   Assessment & Plan: Visit Diagnoses:  1. Acute pain of right shoulder     Plan: He is shown forward flexion exercises, wall crawls, and pendulum exercises which she will perform every other day.  We will see him back in 2 weeks to see what type of response he had to the injection and the exercises.  Questions were encouraged and answered.  Follow-Up Instructions: Return in about 2 weeks (around 12/22/2018).   Orders:  Orders Placed This Encounter  Procedures  . Large Joint Inj: R subacromial bursa  . XR Shoulder Right   No orders of the defined types were placed in this encounter.     Procedures: Large Joint Inj: R subacromial bursa on 12/08/2018 3:58 PM Indications: pain Details: 22 G 1.5 in needle, superior approach  Arthrogram: No  Medications: 3 mL lidocaine 1 %; 40 mg methylPREDNISolone acetate 40 MG/ML Outcome: tolerated well, no immediate complications Procedure, treatment alternatives, risks and benefits explained, specific risks discussed. Consent was given by the patient. Immediately prior to procedure a time out was called to verify the correct patient, procedure, equipment, support staff and site/side marked as required. Patient was prepped and draped in the usual sterile fashion.       Clinical Data: No additional findings.   Subjective: Chief Complaint  Patient presents with  . Right Shoulder - Pain    HPI Allen James is well-known to my service comes in today for right shoulder pain for approximately 8 weeks.  He states that he started having some right shoulder pain after using a leaf blower.  Pain is worse whenever he lies on the right shoulder.  Denies any numbness tingling down the arm.  He  describes the pain as dull achy pain.  No real decrease in range of motion of the shoulder.  He is on chronic Norco and tramadol for his back pain but this does not seem to help with his right shoulder pain.  Denies any neck pain. Review of Systems See HPI   Objective: Vital Signs: There were no vitals taken for this visit.  Physical Exam Constitutional:      Appearance: Normal appearance. He is not ill-appearing or diaphoretic.  Pulmonary:     Effort: Pulmonary effort is normal.  Neurological:     Mental Status: He is alert.  Psychiatric:        Mood and Affect: Mood normal.     Ortho Exam Mild shoulders and spine of hamstrings against resistance with external and internal rotation.  Empty can test is negative bilaterally.  Slightly positive impingement test on the right negative on the left.  Sensation intact bilateral hands throughout to light touch.  Radial pulses are intact bilaterally. Specialty Comments:  No specialty comments available.  Imaging: Xr Shoulder Right  Result Date: 12/08/2018 Right shoulder 3 views: Shoulders well located.  No acute fractures.  Glenohumeral joint is well-maintained.  Subacromial space well maintained.    PMFS History: Patient Active Problem List   Diagnosis Date Noted  . 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 Coronary artery  disease 06/06/2018  . Benign essential hypertension 12/20/2016  . Dyspepsia 03/07/2016  . Primary insomnia 03/07/2016  . Obstructive apnea 12/28/2014  . Pure hypercholesterolemia 12/28/2014  . Osteoarthrosis 09/30/2014  . Chronic lower back pain 07/13/2014  . Muscle soreness 04/27/2014  . CAD (coronary artery disease) 04/14/2014  . AKI (acute kidney injury) ([place]) 04/14/2014  . Obesity 04/14/2014  . Adiposity 04/14/2014  . Coronary artery disease involving native coronary artery with angina pectoris ([place]) 04/14/2014  . Injury of kidney 04/14/2014  . OSA on CPAP   . Hyperlipidemia   . NSTEMI (non-ST elevated myocardial  infarction) ([place]) 04/12/2014  . Acute non-ST segment elevation myocardial infarction ([place]) 04/12/2014  . Acute non-ST elevation myocardial infarction (NSTEMI) ([place]) 04/12/2014  . HOARSENESS 10/18/2009  . SLEEP APNEA 02/24/2009   Past Medical History:  Diagnosis Date  . Abdominal hernia    pt. states he had it for 10 yrs or longer  . Abnormal findings on cardiac catheterization    ! Do NOT use RIGHT radial access on future caths or PCI.  Marland Kitchen Acute non-ST elevation myocardial infarction (NSTEMI) ([place]) 04/12/2014  . Acute non-ST segment elevation myocardial infarction ([facility] [facility]) 04/12/2014  . Adiposity 04/14/2014  . AKI (acute kidney injury) ([place]) 04/14/2014  . Anginal pain ([place])   . Arthritis   . Back pain   . Benign essential hypertension 12/20/2016  . CAD (coronary artery disease)    a. NSTEMI 04/12/14 s/p DES x 2 to RCA. Prox 3.5x38 mm Xience Alpine DES, Dist 3.5x28 mm Xience DES; EF 65-70%. Staged PCI to the OM1 with 2.75 x 12 mm Resolute DES     . Chronic coronary artery disease 04/14/2014  . Chronic lower back pain 07/13/2014  . Dyspepsia 03/07/2016  . Dyspnea   . GERD (gastroesophageal reflux disease)   . H/O hiatal hernia   . Hyperlipidemia   . Injury of kidney 04/14/2014  . Muscle soreness 04/27/2014  . NSTEMI (non-ST elevated myocardial infarction) ([place] [facility]) 04/12/2014  . Obesity 04/14/2014  . Obstructive apnea 12/28/2014  . OSA on CPAP   . Osteoarthrosis 09/30/2014  . Primary insomnia 03/07/2016  . Pure hypercholesterolemia 12/28/2014  . SLEEP APNEA 02/24/2009   Qualifier: Diagnosis of  By: Annamaria Boots MD, Clinton D     Family History  Problem Relation Age of Onset  . Coronary artery disease Mother 70    Past Surgical History:  Procedure Laterality Date  . APPENDECTOMY     Remote  . CARPAL TUNNEL RELEASE Bilateral   . CORONARY ANGIOPLASTY  04/12/14   STENT TO RCA  . CORONARY ARTERY BYPASS GRAFT N/A 06/06/2018   Procedure: CORONARY ARTERY BYPASS GRAFTING (CABG) x 3 WITH ENDOSCOPIC HARVESTING OF  RIGHT SAPHENOUS VEIN;  Surgeon: Ivin Poot, MD;  Location: [facility];  Service: Open Heart Surgery;  Laterality: N/A;  . KNEE ARTHROSCOPY Bilateral   . LEFT HEART CATH AND CORONARY ANGIOGRAPHY N/A 05/30/2018   Procedure: LEFT HEART CATH AND CORONARY ANGIOGRAPHY;  Surgeon: Sherren Mocha, MD;  Location: [facility];  Service: Cardiovascular;  Laterality: N/A;  . LEFT HEART CATHETERIZATION WITH CORONARY ANGIOGRAM N/A 04/12/2014   Procedure: LEFT HEART CATHETERIZATION WITH CORONARY ANGIOGRAM;  Surgeon: Blane Ohara, MD;  Location: [facility] CATH LAB;  Service: Cardiovascular;  Laterality: N/A;  . PERCUTANEOUS CORONARY STENT INTERVENTION (PCI-S)  04/12/2014   Procedure: PERCUTANEOUS CORONARY STENT INTERVENTION (PCI-S);  Surgeon: Blane Ohara, MD;  Location: [facility] CATH LAB;  Service: Cardiovascular;;  . PERCUTANEOUS CORONARY STENT INTERVENTION (PCI-S)  N/A 04/14/2014   Procedure: PERCUTANEOUS CORONARY STENT INTERVENTION (PCI-S);  Surgeon: Peter M Martinique, MD;  Location: [facility] CATH LAB;  Service: Cardiovascular;  Laterality: N/A;  . TEE WITHOUT CARDIOVERSION N/A 06/06/2018   Procedure: TRANSESOPHAGEAL ECHOCARDIOGRAM (TEE);  Surgeon: Prescott Gum, Collier Salina, MD;  Location: [facility];  Service: Open Heart Surgery;  Laterality: N/A;   Social History   Occupational History  . Not on file  Tobacco Use  . Smoking status: Former Smoker    Last attempt to quit: 11/26/1982    Years since quitting: 36.0  . Smokeless tobacco: Never Used  Substance and Sexual Activity  . Alcohol use: No  . Drug use: No  . Sexual activity: Not on file

## 2018-12-24 ENCOUNTER — Ambulatory Visit (INDEPENDENT_AMBULATORY_CARE_PROVIDER_SITE_OTHER): Payer: Medicare HMO | Admitting: Physician Assistant

## 2018-12-24 ENCOUNTER — Encounter (INDEPENDENT_AMBULATORY_CARE_PROVIDER_SITE_OTHER): Payer: Self-pay

## 2019-04-15 ENCOUNTER — Ambulatory Visit: Payer: Medicare HMO | Admitting: Nurse Practitioner

## 2019-04-22 ENCOUNTER — Telehealth: Payer: Self-pay | Admitting: *Deleted

## 2019-04-22 NOTE — Telephone Encounter (Signed)
Follow Up:    Pt's wife called back and was told to sign consent and listen to instructions. Pt's wife said he would and left phone number that pt should be called on.

## 2019-04-22 NOTE — Telephone Encounter (Signed)
lvm with instructions for telehealth visit tomorrow, pt is aware if cannot keep appt to call and R/S.  Pt will need consent in chart.

## 2019-04-22 NOTE — Telephone Encounter (Signed)
Virtual Visit Pre-Appointment Phone Call  "(Name), I am calling you today to discuss your upcoming appointment. We are currently trying to limit exposure to the virus that causes COVID-19 by seeing patients at home rather than in the office."  1. "What is the BEST phone number to call the day of the visit?" - include this in appointment notes  2. "Do you have or have access to (through a family member/friend) a smartphone with video capability that we can use for your visit?" a. If yes - list this number in appt notes as "cell" (if different from BEST phone #) and list the appointment type as a VIDEO visit in appointment notes b. If no - list the appointment type as a PHONE visit in appointment notes  3. Confirm consent - "In the setting of the current Covid19 crisis, you are scheduled for a (phone or video) visit with your provider on (Thursday, May 27) at (time).  Just as we do with many in-office visits, in order for you to participate in this visit, we must obtain consent.  If you'd like, I can send this to your mychart (if signed up) or email for you to review.  Otherwise, I can obtain your verbal consent now.  All virtual visits are billed to your insurance company just like a normal visit would be.  By agreeing to a virtual visit, we'd like you to understand that the technology does not allow for your provider to perform an examination, and thus may limit your provider's ability to fully assess your condition. If your provider identifies any concerns that need to be evaluated in person, we will make arrangements to do so.  Finally, though the technology is pretty good, we cannot assure that it will always work on either your or our end, and in the setting of a video visit, we may have to convert it to a phone-only visit.  In either situation, we cannot ensure that we have a secure connection.  Are you willing to proceed?" STAFF: Did the patient verbally acknowledge consent to telehealth visit?  Document YES/NO here: YES.  4. Advise patient to be prepared - "Two hours prior to your appointment, go ahead and check your blood pressure, pulse, oxygen saturation, and your weight (if you have the equipment to check those) and write them all down. When your visit starts, your provider will ask you for this information. If you have an Apple Watch or Kardia device, please plan to have heart rate information ready on the day of your appointment. Please have a pen and paper handy nearby the day of the visit as well."  5. Give patient instructions for MyChart download to smartphone OR Doximity/[email or web address] as below if video visit (depending on what platform provider is using)  6. Inform patient they will receive a phone call 15 minutes prior to their appointment time (may be from unknown caller ID) so they should be prepared to answer    TELEPHONE CALL NOTE  Allen James has been deemed a candidate for a follow-up tele-health visit to limit community exposure during the Covid-19 pandemic. I spoke with the patient via phone to ensure availability of phone/video source, confirm preferred email & phone number, and discuss instructions and expectations.  I reminded Allen James to be prepared with any vital sign and/or heart rhythm information that could potentially be obtained via home monitoring, at the time of his visit. I reminded Allen James to expect a phone call  prior to his visit.  Dimitria Ketchum Avanell Shackleton 04/22/2019 1:20 PM   INSTRUCTIONS FOR DOWNLOADING THE MYCHART APP TO SMARTPHONE  - The patient must first make sure to have activated MyChart and know their login information - If Apple, go to [employer] and type in MyChart in the search bar and download the app. If Android, ask patient to go to [employer] and type in [facility] [place] in the search bar and download the app. The app is free but as with any other app downloads, their phone may require them to verify saved payment  information or Apple/Android password.  - The patient will need to then log into the app with their MyChart username and password, and select [facility] as their healthcare provider to link the account. When it is time for your visit, go to the MyChart app, find appointments, and click Begin Video Visit. Be sure to Select Allow for your device to access the Microphone and Camera for your visit. You will then be connected, and your provider will be with you shortly.  **If they have any issues connecting, or need assistance please contact MyChart service desk [phone])**  **If using a computer, in order to ensure the best quality for their visit they will need to use either of the following Internet Browsers: [employer], or Google Chrome**  IF USING DOXIMITY or DOXY.ME - The patient will receive a link just prior to their visit by text.     FULL LENGTH CONSENT FOR TELE-HEALTH VISIT   I hereby voluntarily request, consent and authorize [facility] and its employed or contracted physicians, physician assistants, nurse practitioners or other licensed health care professionals (the Practitioner), to provide me with telemedicine health care services (the "Services") as deemed necessary by the treating Practitioner. I acknowledge and consent to receive the Services by the Practitioner via telemedicine. I understand that the telemedicine visit will involve communicating with the Practitioner through live audiovisual communication technology and the disclosure of certain medical information by electronic transmission. I acknowledge that I have been given the opportunity to request an in-person assessment or other available alternative prior to the telemedicine visit and am voluntarily participating in the telemedicine visit.  I understand that I have the right to withhold or withdraw my consent to the use of telemedicine in the course of my care at any time, without affecting my right  to future care or treatment, and that the Practitioner or I may terminate the telemedicine visit at any time. I understand that I have the right to inspect all information obtained and/or recorded in the course of the telemedicine visit and may receive copies of available information for a reasonable fee.  I understand that some of the potential risks of receiving the Services via telemedicine include:  Marland Kitchen Delay or interruption in medical evaluation due to technological equipment failure or disruption; . Information transmitted may not be sufficient (e.g. poor resolution of images) to allow for appropriate medical decision making by the Practitioner; and/or  . In rare instances, security protocols could fail, causing a breach of personal health information.  Furthermore, I acknowledge that it is my responsibility to provide information about my medical history, conditions and care that is complete and accurate to the best of my ability. I acknowledge that Practitioner's advice, recommendations, and/or decision may be based on factors not within their control, such as incomplete or inaccurate data provided by me or distortions of diagnostic images or specimens that may result from electronic  transmissions. I understand that the practice of medicine is not an exact science and that Practitioner makes no warranties or guarantees regarding treatment outcomes. I acknowledge that I will receive a copy of this consent concurrently upon execution via email to the email address I last provided but may also request a printed copy by calling the office of [facility].    I understand that my insurance will be billed for this visit.   I have read or had this consent read to me. . I understand the contents of this consent, which adequately explains the benefits and risks of the Services being provided via telemedicine.  . I have been provided ample opportunity to ask questions regarding this consent and the Services  and have had my questions answered to my satisfaction. . I give my informed consent for the services to be provided through the use of telemedicine in my medical care  By participating in this telemedicine visit I agree to the above.

## 2019-04-23 ENCOUNTER — Telehealth: Payer: Medicare HMO | Admitting: Nurse Practitioner

## 2019-05-11 NOTE — Progress Notes (Signed)
Telehealth Visit     Virtual Visit via Video Note   This visit type was conducted due to national recommendations for restrictions regarding the COVID-19 Pandemic (e.g. social distancing) in an effort to limit this patient's exposure and mitigate transmission in our community.  Due to his co-morbid illnesses, this patient is at least at moderate risk for complications without adequate follow up.  This format is felt to be most appropriate for this patient at this time.  All issues noted in this document were discussed and addressed.  A limited physical exam was performed with this format.  Please refer to the patient's chart for his consent to telehealth for [facility].   Evaluation Performed:  Follow-up visit  This visit type was conducted due to national recommendations for restrictions regarding the COVID-19 Pandemic (e.g. social distancing).  This format is felt to be most appropriate for this patient at this time.  All issues noted in this document were discussed and addressed.  No physical exam was performed (except for noted visual exam findings with Video Visits).  Please refer to the patient's chart (MyChart message for video visits and phone note for telephone visits) for the patient's consent to telehealth for [facility].  Date:  05/12/2019   ID:  Allen James, DOB [birth year], MRN [id number]  Patient Location:  Home  Provider location:   Home  PCP:  Delilah Shan, MD  Cardiologist:  Servando Snare Sherren Mocha, MD  Electrophysiologist:  None   Chief Complaint:  Follow up .   History of Present Illness:    KEMAR PANDIT is a 78 y.o. male who presents via audio/video conferencing for a telehealth visit today.  Seen for Dr. Burt Knack.   He has a known history of HLD and known CAD with prior NSTEMI with DES to RCA x 2 in 2015. In June of 2019 he had progressive angina - was cathed and referred for CABG x 3 per PVT in July of 2019 with LIMA to LAD, SVG to Diagonal, and  SVG to OM and endoscopic harvest of greater saphenous vein from his right leg. Post op course was noted for PAF, AKI and volume overload.   I last saw him in August of 2019 - he was doing well - we stopped his amiodarone and Lasix post CABG.  Last visit with Dr. Burt Knack was in November and he continued to be doing well.   The patient does not have symptoms concerning for COVID-19 infection (fever, chills, cough, or new shortness of breath).   Seen today via Doximity video. He has consented for this visit. He is doing well. No real concerns noted. He has been staying in and doing well. He is doing some walking - but mostly working in his flowers. He has been playing golf. He has seen his PCP last week and had labs. He reports his labs were ok. He is more limited by his arthritis - he is on Mobic - this has caused some minor edema but he would "rather have the swelling than the pain". Not short of breath. Not dizzy. No palpitations. Overall, he feels like he is doing ok. He just had his 78th birthday.   Past Medical History:  Diagnosis Date  . Abdominal hernia    pt. states he had it for 10 yrs or longer  . Abnormal findings on cardiac catheterization    ! Do NOT use RIGHT radial access on future caths or PCI.  Marland Kitchen Acute non-ST elevation  myocardial infarction (NSTEMI) ([place]) 04/12/2014  . Acute non-ST segment elevation myocardial infarction ([place]) 04/12/2014  . Adiposity 04/14/2014  . AKI (acute kidney injury) ([place]) 04/14/2014  . Anginal pain ([place])   . Arthritis   . Back pain   . Benign essential hypertension 12/20/2016  . CAD (coronary artery disease)    a. NSTEMI 04/12/14 s/p DES x 2 to RCA. Prox 3.5x38 mm Xience Alpine DES, Dist 3.5x28 mm Xience DES; EF 65-70%. Staged PCI to the OM1 with 2.75 x 12 mm Resolute DES     . Chronic coronary artery disease 04/14/2014  . Chronic lower back pain 07/13/2014  . Dyspepsia 03/07/2016  . Dyspnea   . GERD (gastroesophageal reflux disease)   . H/O hiatal hernia    . Hyperlipidemia   . Injury of kidney 04/14/2014  . Muscle soreness 04/27/2014  . NSTEMI (non-ST elevated myocardial infarction) ([place]) 04/12/2014  . Obesity 04/14/2014  . Obstructive apnea 12/28/2014  . OSA on CPAP   . Osteoarthrosis 09/30/2014  . Primary insomnia 03/07/2016  . Pure hypercholesterolemia 12/28/2014  . SLEEP APNEA 02/24/2009   Qualifier: Diagnosis of  By: Annamaria Boots MD, Kasandra Knudsen    Past Surgical History:  Procedure Laterality Date  . APPENDECTOMY     Remote  . CARPAL TUNNEL RELEASE Bilateral   . CORONARY ANGIOPLASTY  04/12/14   STENT TO RCA  . CORONARY ARTERY BYPASS GRAFT N/A 06/06/2018   Procedure: CORONARY ARTERY BYPASS GRAFTING (CABG) x 3 WITH ENDOSCOPIC HARVESTING OF RIGHT SAPHENOUS VEIN;  Surgeon: Ivin Poot, MD;  Location: [place];  Service: Open Heart Surgery;  Laterality: N/A;  . KNEE ARTHROSCOPY Bilateral   . LEFT HEART CATH AND CORONARY ANGIOGRAPHY N/A 05/30/2018   Procedure: LEFT HEART CATH AND CORONARY ANGIOGRAPHY;  Surgeon: Sherren Mocha, MD;  Location: [place] CV LAB;  Service: Cardiovascular;  Laterality: N/A;  . LEFT HEART CATHETERIZATION WITH CORONARY ANGIOGRAM N/A 04/12/2014   Procedure: LEFT HEART CATHETERIZATION WITH CORONARY ANGIOGRAM;  Surgeon: Blane Ohara, MD;  Location: [facility] CATH LAB;  Service: Cardiovascular;  Laterality: N/A;  . PERCUTANEOUS CORONARY STENT INTERVENTION (PCI-S)  04/12/2014   Procedure: PERCUTANEOUS CORONARY STENT INTERVENTION (PCI-S);  Surgeon: Blane Ohara, MD;  Location: [facility] CATH LAB;  Service: Cardiovascular;;  . PERCUTANEOUS CORONARY STENT INTERVENTION (PCI-S) N/A 04/14/2014   Procedure: PERCUTANEOUS CORONARY STENT INTERVENTION (PCI-S);  Surgeon: Peter M Martinique, MD;  Location: [facility] CATH LAB;  Service: Cardiovascular;  Laterality: N/A;  . TEE WITHOUT CARDIOVERSION N/A 06/06/2018   Procedure: TRANSESOPHAGEAL ECHOCARDIOGRAM (TEE);  Surgeon: Prescott Gum, Collier Salina, MD;  Location: [place];  Service: Open Heart Surgery;  Laterality: N/A;      Current Meds  Medication Sig  . amLODipine (NORVASC) 5 MG tablet Take 5 mg by mouth daily.   Marland Kitchen aspirin EC 81 MG tablet Take 81 mg by mouth daily.  Marland Kitchen atorvastatin (LIPITOR) 40 MG tablet Take 1 tablet (40 mg total) by mouth daily at 6 PM.  . docusate sodium (COLACE) 250 MG capsule Take 250 mg by mouth daily as needed for constipation.   Marland Kitchen HYDROcodone-acetaminophen (NORCO/VICODIN) 5-325 MG per tablet Take 1 tablet by mouth 3 (three) times daily as needed for moderate pain.   . meloxicam (MOBIC) 15 MG tablet Take 15 mg by mouth daily.  . metoprolol succinate (TOPROL-XL) 50 MG 24 hr tablet Take 1 tablet (50 mg total) by mouth daily.  . nitroGLYCERIN (NITROSTAT) 0.4 MG SL tablet Place 1 tablet (0.4 mg total) under the tongue every  5 (five) minutes as needed for chest pain.  . pantoprazole (PROTONIX) 40 MG tablet Take 1 tablet (40 mg total) by mouth daily.  . traMADol (ULTRAM) 50 MG tablet Take 2 tablets (100 mg total) by mouth 2 (two) times daily.     Allergies:   Patient has no known allergies.   Social History   Tobacco Use  . Smoking status: Former Smoker    Quit date: 11/26/1982    Years since quitting: 36.4  . Smokeless tobacco: Never Used  Substance Use Topics  . Alcohol use: No  . Drug use: No     Family Hx: The patient's family history includes Coronary artery disease (age of onset: 28) in his mother.  ROS:   Please see the history of present illness.   All other systems reviewed are negative.    Objective:    Vital Signs:  BP 130/71   Pulse 77   Ht 5\' 10"  (1.778 m)   Wt 261 lb (118.4 kg)   BMI 37.45 kg/m    Wt Readings from Last 3 Encounters:  05/12/19 261 lb (118.4 kg)  10/15/18 263 lb 12.8 oz (119.7 kg)  10/01/18 258 lb (117 kg)    Alert male in no acute distress. Not short of breath with conversation. He is obese.    Labs/Other Tests and Data Reviewed:    Lab Results  Component Value Date   WBC 5.2 07/16/2018   HGB 12.9 (L) 07/16/2018   HCT 39.0  07/16/2018   PLT 253 07/16/2018   GLUCOSE 114 (H) 07/16/2018   CHOL 136 07/16/2018   TRIG 79 07/16/2018   HDL 46 07/16/2018   LDLCALC 74 07/16/2018   ALT 17 07/16/2018   AST 16 07/16/2018   NA 140 07/16/2018   K 4.6 07/16/2018   CL 102 07/16/2018   CREATININE 1.23 07/16/2018   BUN 18 07/16/2018   CO2 22 07/16/2018   TSH 4.936 (H) 06/11/2018   INR 1.21 06/06/2018   HGBA1C 6.0 (H) 06/05/2018     BNP (last 3 results) No results for input(s): BNP in the last 8760 hours.  ProBNP (last 3 results) No results for input(s): PROBNP in the last 8760 hours.   Prior CV studies:    The following studies were reviewed today:  LEFT HEART CATH AND CORONARY ANGIOGRAPHY7/2019  Conclusion     The left ventricular systolic function is normal.  LV end diastolic pressure is mildly elevated.  The left ventricular ejection fraction is 55-65% by visual estimate.  Ost LAD lesion is 90% stenosed.  Prox LAD lesion is 50% stenosed.  Mid LAD lesion is 40% stenosed.  2nd Diag lesion is 40% stenosed.  Ost 1st Mrg lesion is 50% stenosed.  Previously placed 1st Mrg stent (unknown type) is widely patent.  Previously placed Prox RCA to Mid RCA stent (unknown type) is widely patent.  Previously placed Mid RCA to Dist RCA stent (unknown type) is widely patent.  Mid RCA lesion is 50% stenosed.  1. Severe ostial LAD stenosis with mild to moderate proximal and mid LAD stenosis 2. Patent left main with no significant stenosis 3. Patent left circumflex stent (first OM) with moderate stenosis proximal to the stented segment 4. Patent RCA stents with mild to moderate stenosis just between the stents in the mid RCA 5. Normal LV function  Recommendations: Recommend cardiac surgical consultation for consideration of CABG. Patient has diffuse coronary artery disease and severe stenosis involving the true ostium of the LAD. I  think he would benefit from surgical revascularization. He will  be instructed to discontinue clopidogrel in anticipation of CABG. Will refer for outpatient surgical consult.    CABG07/10/2018  PROCEDURE PERFORMED: 1. Coronary artery bypass grafting x3 (left internal mammary artery to left anterior descending, saphenous vein graft to diagonal, saphenous vein graft to circumflex marginal). 2. Endoscopic harvest of right leg greater saphenous vein.   ASSESSMENT & PLAN:    1. CAD - prior NSTEMI with DES x 2 to the RCA in 2015 - s/p cath with subsequent CABG x 3 in July of 2019 - now almost one year out - doing well. Continue with current regimen and encouraged CV risk factor modification.   2. Post of AF- resolved. Hr is good today.   3. HTN - BP is good - no changes made today.   4. HLD - on statin - has had recent labs with PCP per his report.   5. Obesity - encouraged activity.   6. OA - his most limiting factor. On Mobic which has caused some mild edema - also on Norvasc - encouraged to continue with salt restriction and consider support stockings if needed.   7. COVID-19 Education: The signs and symptoms of COVID-19 were discussed with the patient and how to seek care for testing (follow up with PCP or arrange E-visit).  The importance of social distancing, staying at home, hand hygiene and wearing a mask when out in public were discussed today.  Patient Risk:   After full review of this patient's clinical status, I feel that they are at least moderate risk at this time.  Time:   Today, I have spent 8 minutes with the patient with telehealth technology discussing the above issues.     Medication Adjustments/Labs and Tests Ordered: Current medicines are reviewed at length with the patient today.  Concerns regarding medicines are outlined above.   Tests Ordered: No orders of the defined types were placed in this encounter.   Medication Changes: No orders of the defined types were placed in this encounter.   Disposition:  FU  with me in 6 months.   Patient is agreeable to this plan and will call if any problems develop in the interim.   Amie Critchley, NP  05/12/2019 10:26 AM    [place]

## 2019-05-12 ENCOUNTER — Telehealth (INDEPENDENT_AMBULATORY_CARE_PROVIDER_SITE_OTHER): Payer: Medicare HMO | Admitting: Nurse Practitioner

## 2019-05-12 ENCOUNTER — Other Ambulatory Visit: Payer: Self-pay

## 2019-05-12 ENCOUNTER — Encounter: Payer: Self-pay | Admitting: Nurse Practitioner

## 2019-05-12 VITALS — BP 130/71 | HR 77 | Ht 70.0 in | Wt 261.0 lb

## 2019-05-12 DIAGNOSIS — I2510 Atherosclerotic heart disease of native coronary artery without angina pectoris: Secondary | ICD-10-CM | POA: Diagnosis not present

## 2019-05-12 DIAGNOSIS — I10 Essential (primary) hypertension: Secondary | ICD-10-CM

## 2019-05-12 DIAGNOSIS — Z951 Presence of aortocoronary bypass graft: Secondary | ICD-10-CM

## 2019-05-12 DIAGNOSIS — Z7189 Other specified counseling: Secondary | ICD-10-CM

## 2019-05-12 DIAGNOSIS — E782 Mixed hyperlipidemia: Secondary | ICD-10-CM

## 2019-05-12 NOTE — Patient Instructions (Addendum)
After Visit Summary:  We will be checking the following labs today - NONE  Medication Instructions:    Continue with your current medicines.    If you need a refill on your cardiac medications before your next appointment, please call your pharmacy.     Testing/Procedures To Be Arranged:  N/A  Follow-Up:   See me in 6 months    At [facility], you and your health needs are our priority.  As part of our continuing mission to provide you with exceptional heart care, we have created designated Provider Care Teams.  These Care Teams include your primary Cardiologist (physician) and Advanced Practice Providers (APPs -  Physician Assistants and Nurse Practitioners) who all work together to provide you with the care you need, when you need it.  Special Instructions:  . Stay safe, stay home, wash your hands for at least 20 seconds and wear a mask when out in public.   It was good to talk with you today.   Stay as active as you can  Continue to restrict your salt and use support stockings if needed for your swelling.    Call the [place] office at [phone] if you have any questions, problems or concerns.

## 2019-07-08 ENCOUNTER — Ambulatory Visit (INDEPENDENT_AMBULATORY_CARE_PROVIDER_SITE_OTHER): Payer: Medicare HMO

## 2019-07-08 ENCOUNTER — Encounter: Payer: Self-pay | Admitting: Physician Assistant

## 2019-07-08 ENCOUNTER — Ambulatory Visit (INDEPENDENT_AMBULATORY_CARE_PROVIDER_SITE_OTHER): Payer: Medicare HMO | Admitting: Physician Assistant

## 2019-07-08 ENCOUNTER — Other Ambulatory Visit: Payer: Self-pay

## 2019-07-08 VITALS — Ht 70.0 in | Wt 260.0 lb

## 2019-07-08 DIAGNOSIS — M722 Plantar fascial fibromatosis: Secondary | ICD-10-CM

## 2019-07-08 DIAGNOSIS — M79672 Pain in left foot: Secondary | ICD-10-CM | POA: Diagnosis not present

## 2019-07-08 NOTE — Progress Notes (Signed)
Office Visit Note   Patient: Allen James           Date of Birth: 1941/11/20           MRN: [id number] Visit Date: 07/08/2019              Requested by: Delilah Shan, MD [id number] [place],  [place] [id number] PCP: Delilah Shan, MD   Assessment & Plan: Visit Diagnoses:  1. Pain in left foot   2. Plantar fasciitis, left     Plan: Recommend that you change her shoes up daily.  Obtain an arch support.  In regards to the swelling in both legs recommended compression socks begin wearing these just in the day not wearing at night.  He shown some gastroc stretching exercises which she will perform.  His pain persist or becomes worse he will follow-up with [place].  Otherwise we will follow-up on an as-needed basis.  Follow-Up Instructions: Return if symptoms worsen or fail to improve.   Orders:  Orders Placed This Encounter  Procedures  . XR Foot Complete Left   No orders of the defined types were placed in this encounter.     Procedures: No procedures performed   Clinical Data: No additional findings.   Subjective: Chief Complaint  Patient presents with  . Left Foot - Pain    HPI Allen James 78 year old male well-known to Dr. Earlyne Iba service comes in today with left foot pain.  He states he stubbed his great toe 2 weeks ago started pain was related that he is having in the arch area of his foot.  Is worse with the first step in the morning and subsides once he puts his shoes on.  Has had no other injury. Both lower extremities slightly worse on the left than the right.  Denies any chest pain or orthopnea. Review of Systems Please see HPI otherwise negative or noncontributory.  Objective: Vital Signs: Ht 5\' 10"  (1.778 m)   Wt 260 lb (117.9 kg)   BMI 37.31 kg/m   Physical Exam General: Well-developed well-nourished male no acute distress. Ortho Exam Bilateral lower extremities +1 pitting edema.  Calves are supple and nontender bilaterally.  Left  foot he has area of ecchymosis over the dorsal aspect of the foot over the second and third metatarsal heads.  No other rashes skin lesions ulcerations.  Left foot tender over the medial tubercle of the calcaneus and the arch region.  He has no tenderness over the posterior tibial tendons peroneal tendons bilaterally.  Nontender over the Achilles tendons bilaterally.  He is able to do single heel raise bilaterally.  Left great toe crepitus with passive range of motion through the MP joint.  Specialty Comments:  No specialty comments available.  Imaging: Xr Foot Complete Left  Result Date: 07/08/2019 3 views left foot: No discrete fractures.  No periosteal reactions particularly of the great toe.  Lisfranc joints are all well-maintained.  First MP joint with moderate arthritic changes.  Heel spur present.    PMFS History: Patient Active Problem List   Diagnosis Date Noted  . 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 Coronary artery disease 06/06/2018  . Benign essential hypertension 12/20/2016  . Dyspepsia 03/07/2016  . Primary insomnia 03/07/2016  . Obstructive apnea 12/28/2014  . Pure hypercholesterolemia 12/28/2014  . Osteoarthrosis 09/30/2014  . Chronic lower back pain 07/13/2014  . Muscle soreness 04/27/2014  . CAD (coronary artery disease) 04/14/2014  . AKI (acute  kidney injury) ([place]) 04/14/2014  . Obesity 04/14/2014  . Adiposity 04/14/2014  . Coronary artery disease involving native coronary artery with angina pectoris ([place]) 04/14/2014  . Injury of kidney 04/14/2014  . OSA on CPAP   . Hyperlipidemia   . NSTEMI (non-ST elevated myocardial infarction) ([place]) 04/12/2014  . Acute non-ST segment elevation myocardial infarction ([place]) 04/12/2014  . Acute non-ST elevation myocardial infarction (NSTEMI) ([place]) 04/12/2014  . HOARSENESS 10/18/2009  . SLEEP APNEA 02/24/2009   Past Medical History:  Diagnosis Date  . Abdominal hernia    pt. states he had it for 10 yrs or longer  . Abnormal  findings on cardiac catheterization    ! Do NOT use RIGHT radial access on future caths or PCI.  Marland Kitchen Acute non-ST elevation myocardial infarction (NSTEMI) ([place]) 04/12/2014  . Acute non-ST segment elevation myocardial infarction ([place]) 04/12/2014  . Adiposity 04/14/2014  . AKI (acute kidney injury) ([place]) 04/14/2014  . Anginal pain ([place])   . Arthritis   . Back pain   . Benign essential hypertension 12/20/2016  . CAD (coronary artery disease)    a. NSTEMI 04/12/14 s/p DES x 2 to RCA. Prox 3.5x38 mm Xience Alpine DES, Dist 3.5x28 mm Xience DES; EF 65-70%. Staged PCI to the OM1 with 2.75 x 12 mm Resolute DES     . Chronic coronary artery disease 04/14/2014  . Chronic lower back pain 07/13/2014  . Dyspepsia 03/07/2016  . Dyspnea   . GERD (gastroesophageal reflux disease)   . H/O hiatal hernia   . Hyperlipidemia   . Injury of kidney 04/14/2014  . Muscle soreness 04/27/2014  . NSTEMI (non-ST elevated myocardial infarction) ([place]) 04/12/2014  . Obesity 04/14/2014  . Obstructive apnea 12/28/2014  . OSA on CPAP   . Osteoarthrosis 09/30/2014  . Primary insomnia 03/07/2016  . Pure hypercholesterolemia 12/28/2014  . SLEEP APNEA 02/24/2009   Qualifier: Diagnosis of  By: Annamaria Boots MD, [place] D     Family History  Problem Relation Age of Onset  . Coronary artery disease Mother 37    Past Surgical History:  Procedure Laterality Date  . APPENDECTOMY     Remote  . CARPAL TUNNEL RELEASE Bilateral   . CORONARY ANGIOPLASTY  04/12/14   STENT TO RCA  . CORONARY ARTERY BYPASS GRAFT N/A 06/06/2018   Procedure: CORONARY ARTERY BYPASS GRAFTING (CABG) x 3 WITH ENDOSCOPIC HARVESTING OF RIGHT SAPHENOUS VEIN;  Surgeon: Ivin Poot, MD;  Location: [place];  Service: Open Heart Surgery;  Laterality: N/A;  . KNEE ARTHROSCOPY Bilateral   . LEFT HEART CATH AND CORONARY ANGIOGRAPHY N/A 05/30/2018   Procedure: LEFT HEART CATH AND CORONARY ANGIOGRAPHY;  Surgeon: Sherren Mocha, MD;  Location: [place] CV LAB;  Service: Cardiovascular;   Laterality: N/A;  . LEFT HEART CATHETERIZATION WITH CORONARY ANGIOGRAM N/A 04/12/2014   Procedure: LEFT HEART CATHETERIZATION WITH CORONARY ANGIOGRAM;  Surgeon: Blane Ohara, MD;  Location: [facility] CATH LAB;  Service: Cardiovascular;  Laterality: N/A;  . PERCUTANEOUS CORONARY STENT INTERVENTION (PCI-S)  04/12/2014   Procedure: PERCUTANEOUS CORONARY STENT INTERVENTION (PCI-S);  Surgeon: Blane Ohara, MD;  Location: [facility] CATH LAB;  Service: Cardiovascular;;  . PERCUTANEOUS CORONARY STENT INTERVENTION (PCI-S) N/A 04/14/2014   Procedure: PERCUTANEOUS CORONARY STENT INTERVENTION (PCI-S);  Surgeon: Peter M Martinique, MD;  Location: [facility] CATH LAB;  Service: Cardiovascular;  Laterality: N/A;  . TEE WITHOUT CARDIOVERSION N/A 06/06/2018   Procedure: TRANSESOPHAGEAL ECHOCARDIOGRAM (TEE);  Surgeon: Prescott Gum, Collier Salina, MD;  Location: [place];  Service: Open Heart  Surgery;  Laterality: N/A;   Social History   Occupational History  . Not on file  Tobacco Use  . Smoking status: Former Smoker    Quit date: 11/26/1982    Years since quitting: 36.6  . Smokeless tobacco: Never Used  Substance and Sexual Activity  . Alcohol use: No  . Drug use: No  . Sexual activity: Not on file

## 2019-10-08 IMAGING — CR DG CHEST 2V
2 series · 2 of 2 positions shown · non-contrast
Comparison: Radiograph April 12, 2014.

CLINICAL DATA: Preop for coronary artery bypass graft.

EXAM:
CHEST - 2 VIEW

[w chest pa]
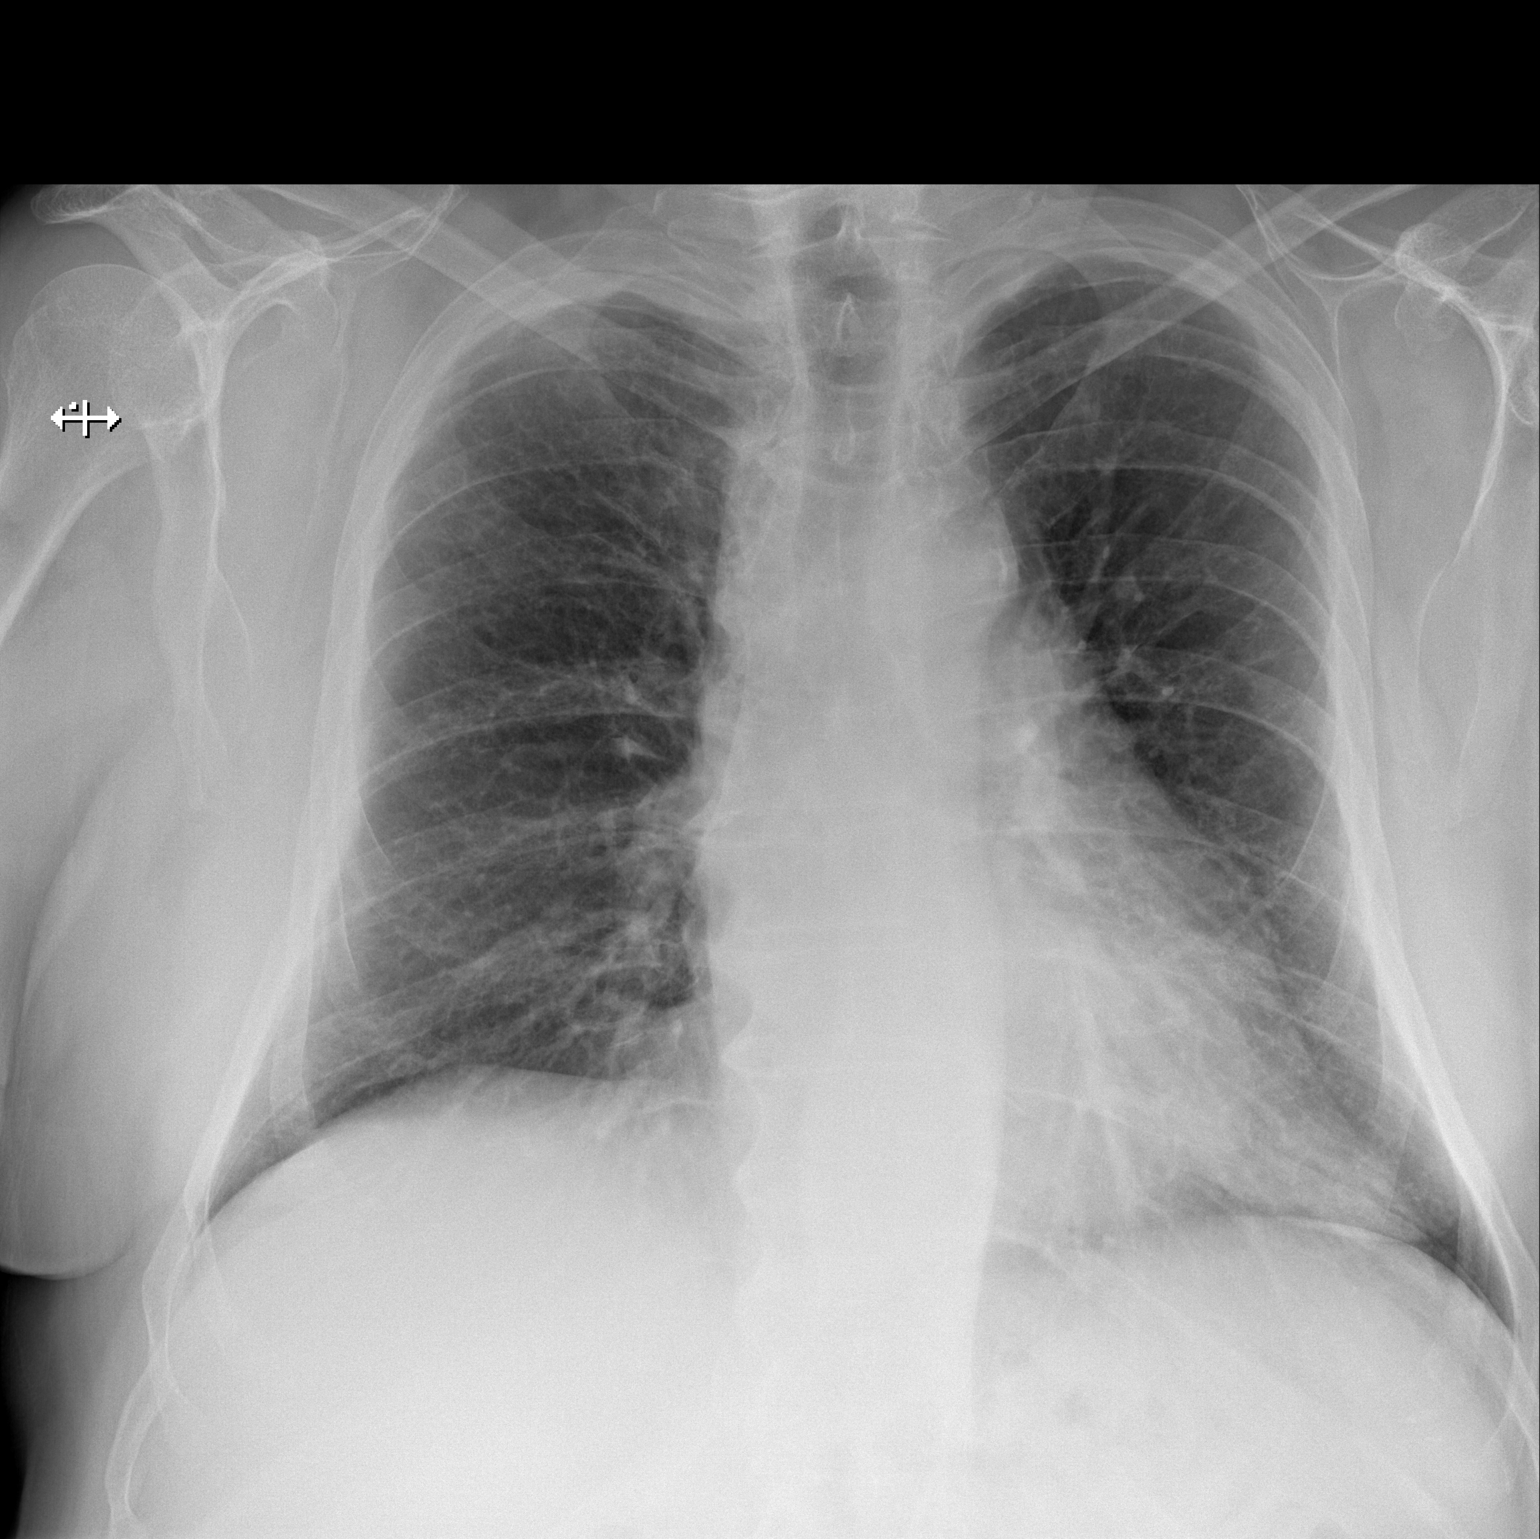

[w chest lat]
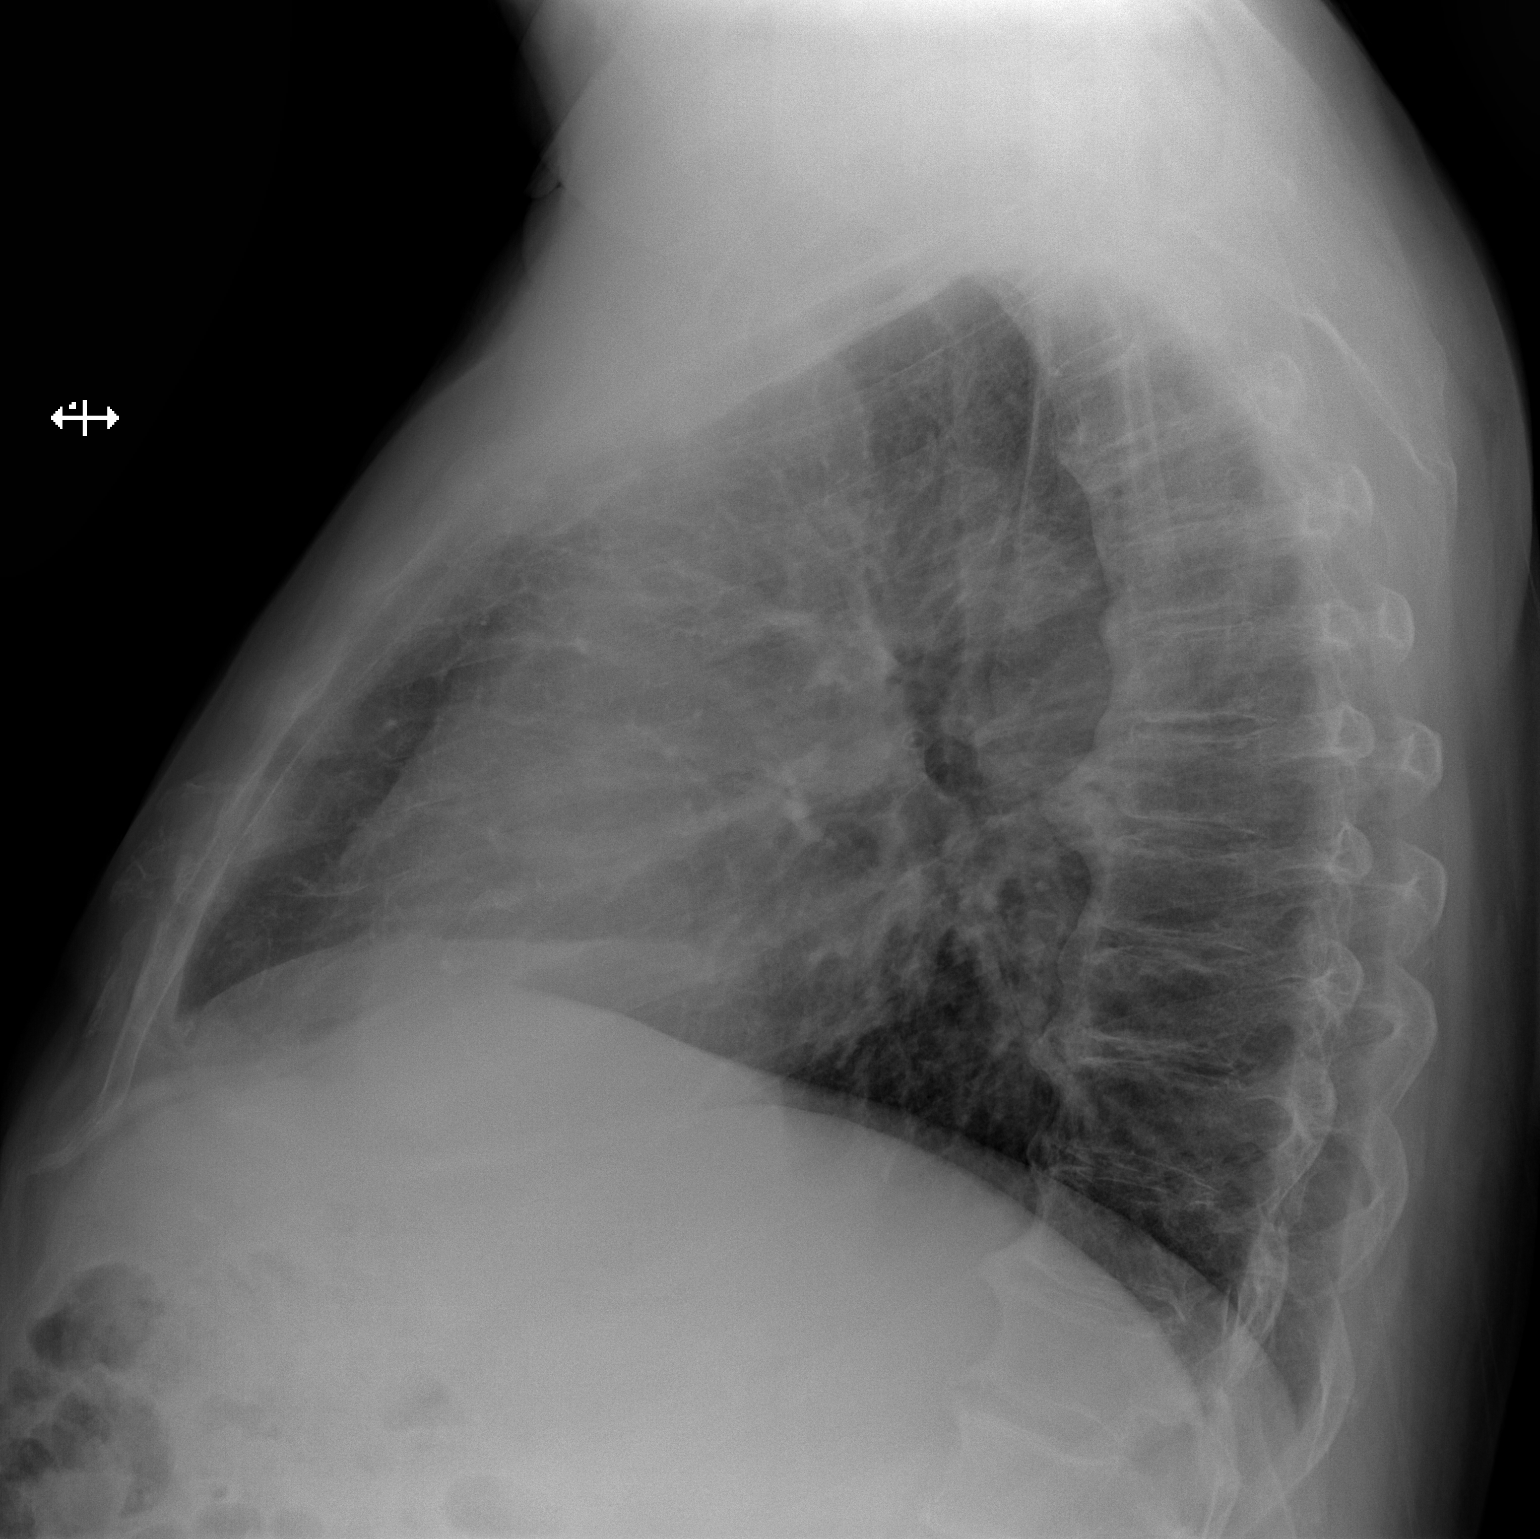

[2 of 2 positions shown; findings below may reference images not displayed]

FINDINGS: The heart size and mediastinal contours are within normal limits.
Both lungs are clear. The visualized skeletal structures are
unremarkable.
IMPRESSION: No active cardiopulmonary disease.

## 2019-10-09 IMAGING — CR DG CHEST 1V PORT
1 series · 1 of 1 positions shown · non-contrast
Comparison: June 05, 2018

CLINICAL DATA: Status post CABG.

EXAM:
PORTABLE CHEST 1 VIEW

[AP]
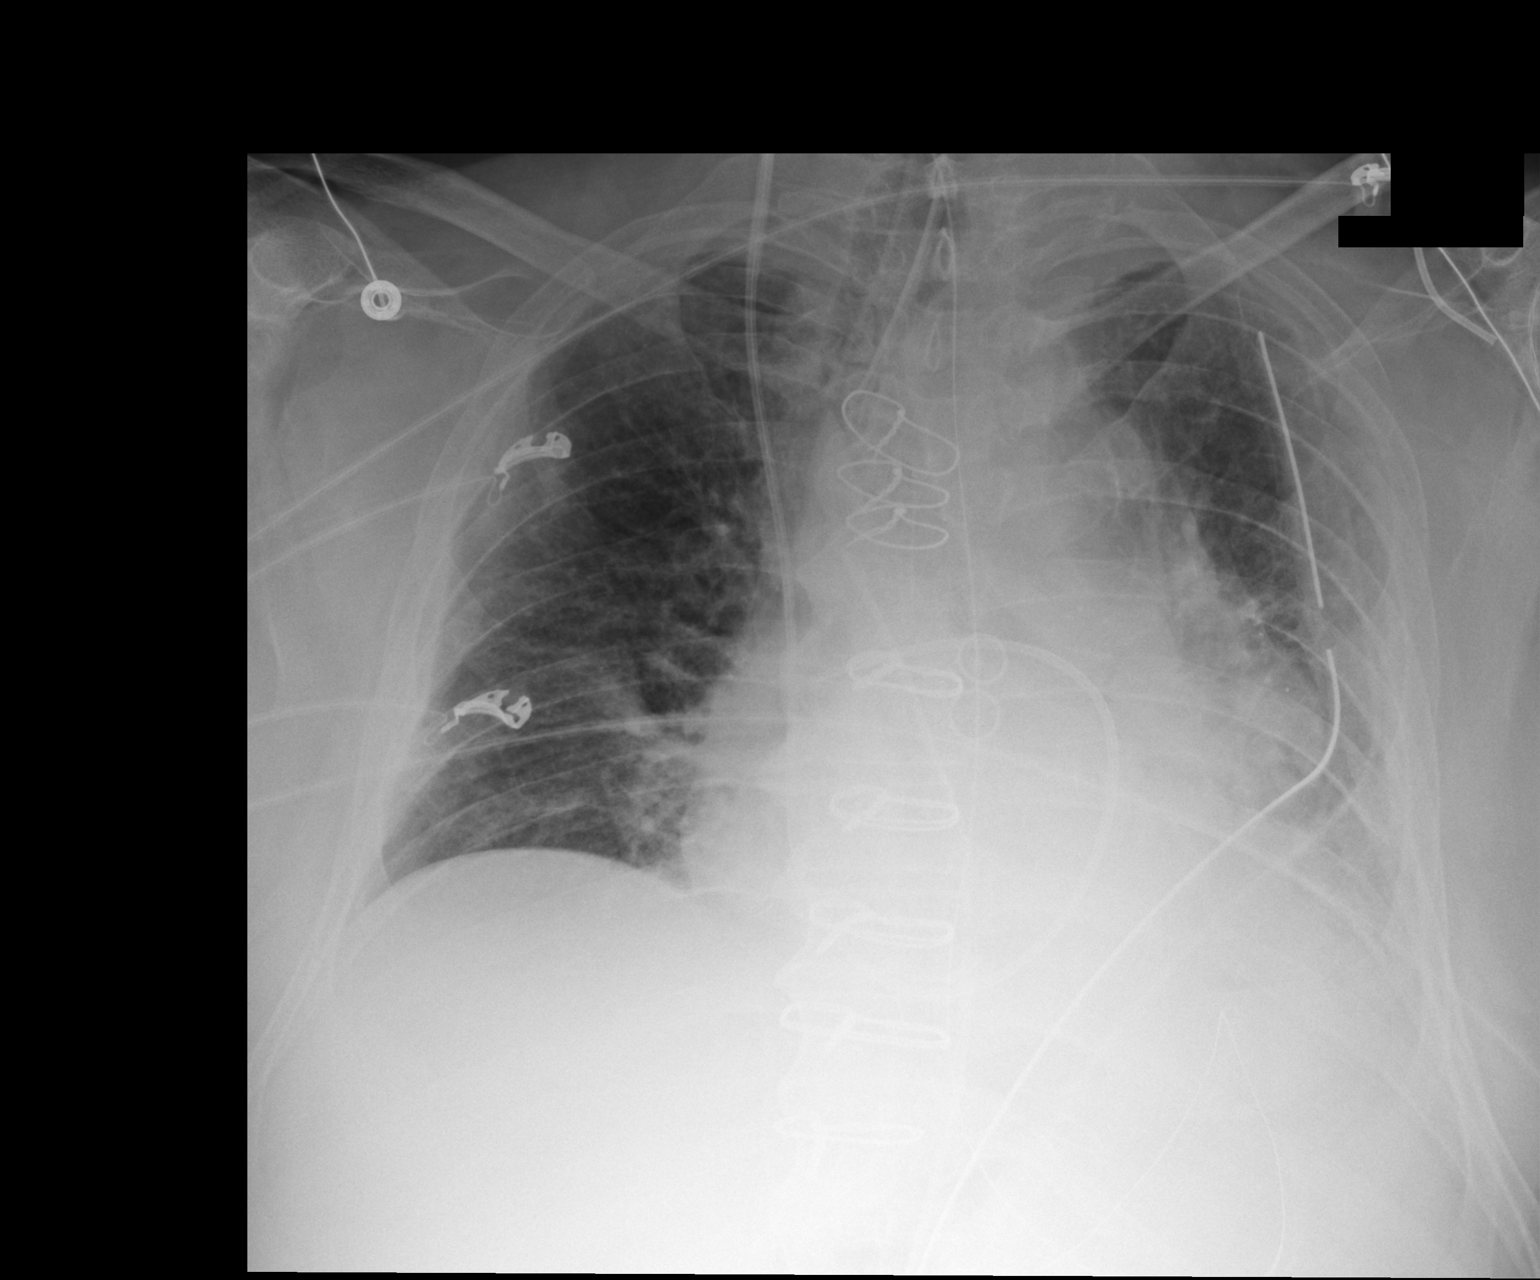

[1 of 1 positions shown; findings below may reference images not displayed]

FINDINGS: ETT is in good position. The NG tube terminates below today's film.
A PA catheter terminates in the right pulmonary outflow tract. A
left chest tube is identified. No pneumothorax. Opacity in left base
is probably small effusion and atelectasis. No overt edema.
Pulmonary venous congestion identified.
IMPRESSION: 1. Support apparatus as above.
2. Left basilar opacity, probably a small effusion and atelectasis.

## 2019-10-10 IMAGING — DX DG CHEST 1V PORT
1 series · 1 of 1 positions shown · non-contrast
Comparison: June 06, 2018

CLINICAL DATA: Status post CABG

EXAM:
PORTABLE CHEST 1 VIEW

[chest]
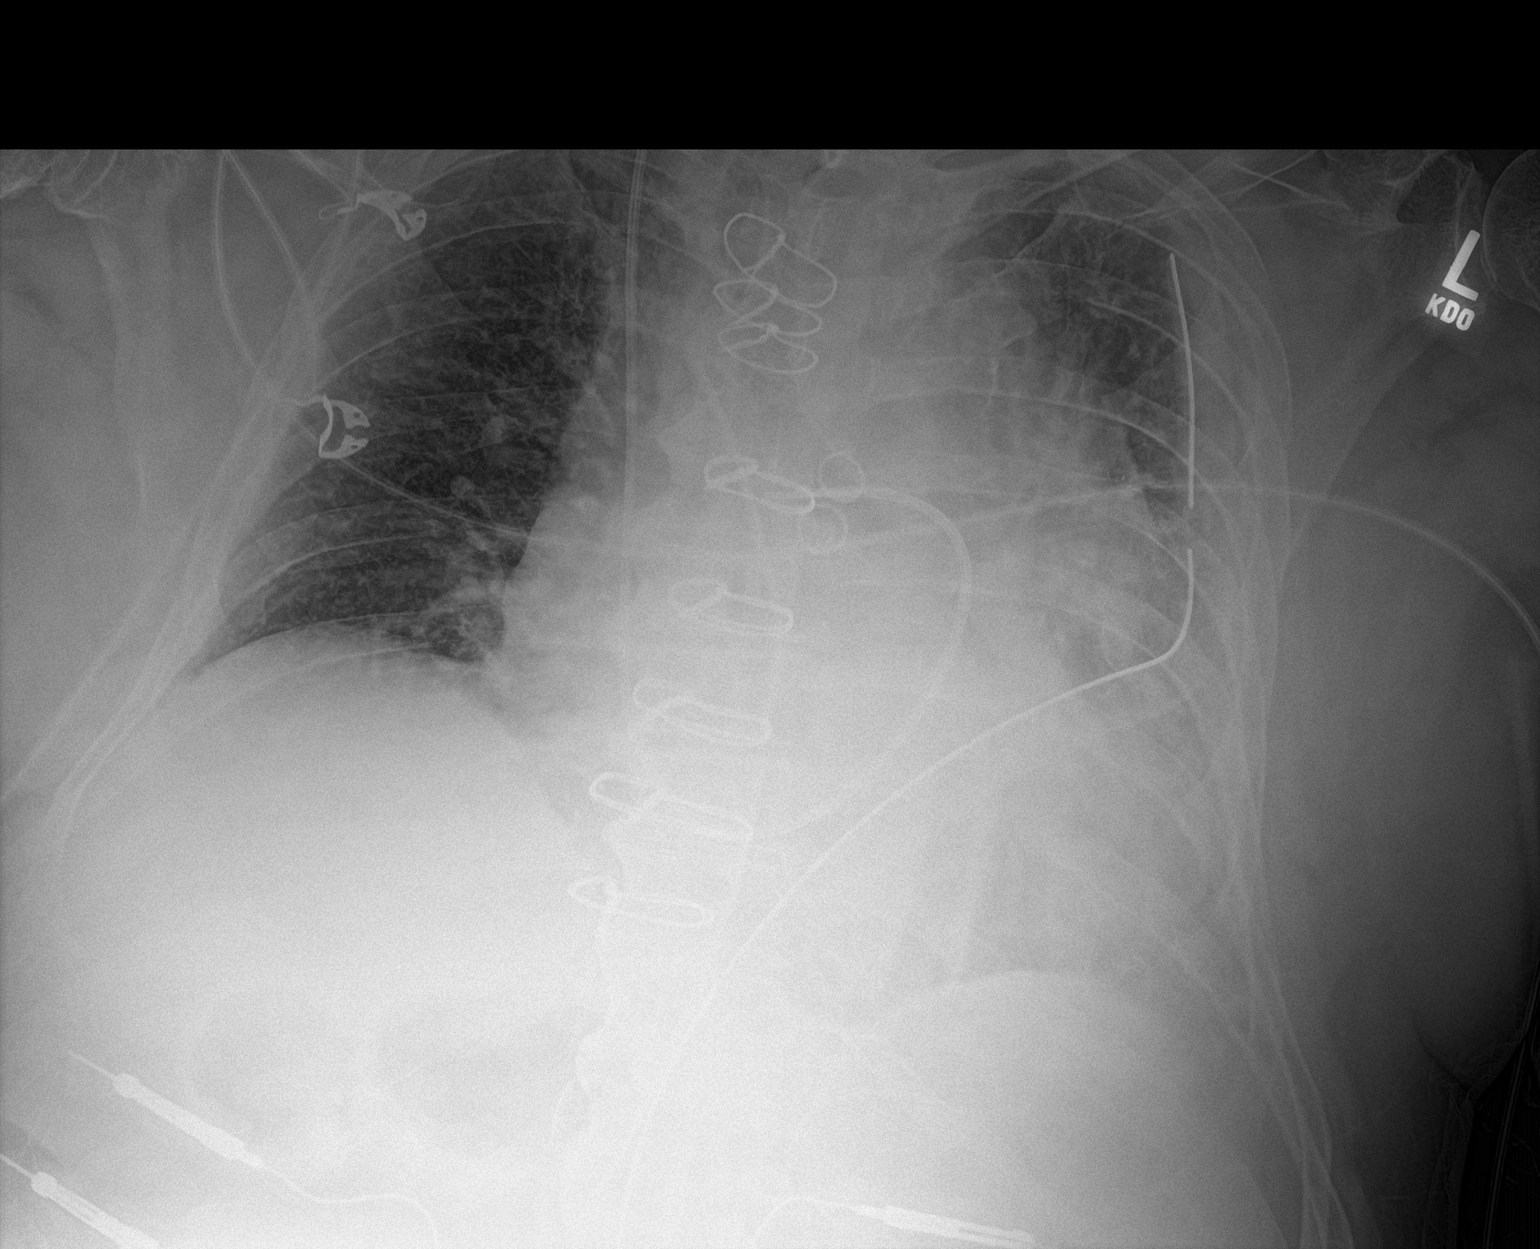

[1 of 1 positions shown; findings below may reference images not displayed]

FINDINGS: The left chest tube and PA catheter are stable. The ET tube is been
removed. No pneumothorax. The right lung is clear. Probable effusion
and underlying atelectasis on the left are similar mildly more
prominent in the interval. No change in the cardiomediastinal
silhouette.
IMPRESSION: 1. Support apparatus as above.
2. Probable effusion and underlying atelectasis on the left are
stable to slightly more prominent in the interval.

## 2019-11-07 NOTE — Progress Notes (Signed)
CARDIOLOGY OFFICE NOTE  Date:  11/10/2019    Allen James Date of Birth: 01-Oct-1941 Medical Record [id number]  PCP:  Delilah Shan, MD  Cardiologist:  Jerel Shepherd    Chief Complaint  Patient presents with  . Follow-up    Seen for Dr. Burt Knack    History of Present Illness: Allen James is a 78 y.o. male who presents today for a 6 month check.  Seen for Dr. Burt Knack.   He has a known history of HLD and known CAD with prior NSTEMI with DES to RCA x 2 in 2015. In June of 2019 he had progressive angina - was cathed and referred for CABG x 3 per PVT in July of 2019 with LIMA to LAD, SVG to Diagonal, and SVG to OM and endoscopic harvest of greater saphenous vein from his right leg. Post op course was noted for PAF, AKI and volume overload.   I last saw him in August of 2019 - he was doing well - we stopped his amiodarone and Lasix post CABG.  Last visit with Dr. Burt Knack was in November and he continued to be doing well. We did a telehealth visit back in June - he was doing well. Working in his flowers.   The patient does not have symptoms concerning for COVID-19 infection (fever, chills, cough, or new shortness of breath).   Comes in today. Here alone. He does not feel like he is doing well. He has noted more swelling in his lower legs. It does go down overnight. He is more short of breath with activity - especially walking back from the mailbox. His energy is not as good as it has been. Not eating out and has been staying home. Does use the salt shaker some at home. No chest pain. Playing some golf but rides the cart. He is on chronic Mobic for OA. Worried about possible CHF - his mom had this. Remote smoker.   Past Medical History:  Diagnosis Date  . Abdominal hernia    pt. states he had it for 10 yrs or longer  . Abnormal findings on cardiac catheterization    ! Do NOT use RIGHT radial access on future caths or PCI.  Marland Kitchen Acute non-ST elevation myocardial infarction  (NSTEMI) ([place]) 04/12/2014  . Acute non-ST segment elevation myocardial infarction ([place]) 04/12/2014  . Adiposity 04/14/2014  . AKI (acute kidney injury) ([place]) 04/14/2014  . Anginal pain ([place])   . Arthritis   . Back pain   . Benign essential hypertension 12/20/2016  . CAD (coronary artery disease)    a. NSTEMI 04/12/14 s/p DES x 2 to RCA. Prox 3.5x38 mm Xience Alpine DES, Dist 3.5x28 mm Xience DES; EF 65-70%. Staged PCI to the OM1 with 2.75 x 12 mm Resolute DES     . Chronic coronary artery disease 04/14/2014  . Chronic lower back pain 07/13/2014  . Dyspepsia 03/07/2016  . Dyspnea   . GERD (gastroesophageal reflux disease)   . H/O hiatal hernia   . Hyperlipidemia   . Injury of kidney 04/14/2014  . Muscle soreness 04/27/2014  . NSTEMI (non-ST elevated myocardial infarction) ([place]) 04/12/2014  . Obesity 04/14/2014  . Obstructive apnea 12/28/2014  . OSA on CPAP   . Osteoarthrosis 09/30/2014  . Primary insomnia 03/07/2016  . Pure hypercholesterolemia 12/28/2014  . SLEEP APNEA 02/24/2009   Qualifier: Diagnosis of  By: Annamaria Boots MD, Kasandra Knudsen     Past Surgical History:  Procedure Laterality Date  .  APPENDECTOMY     Remote  . CARPAL TUNNEL RELEASE Bilateral   . CORONARY ANGIOPLASTY  04/12/14   STENT TO RCA  . CORONARY ARTERY BYPASS GRAFT N/A 06/06/2018   Procedure: CORONARY ARTERY BYPASS GRAFTING (CABG) x 3 WITH ENDOSCOPIC HARVESTING OF RIGHT SAPHENOUS VEIN;  Surgeon: Ivin Poot, MD;  Location: [facility];  Service: Open Heart Surgery;  Laterality: N/A;  . KNEE ARTHROSCOPY Bilateral   . LEFT HEART CATH AND CORONARY ANGIOGRAPHY N/A 05/30/2018   Procedure: LEFT HEART CATH AND CORONARY ANGIOGRAPHY;  Surgeon: Sherren Mocha, MD;  Location: [facility];  Service: Cardiovascular;  Laterality: N/A;  . LEFT HEART CATHETERIZATION WITH CORONARY ANGIOGRAM N/A 04/12/2014   Procedure: LEFT HEART CATHETERIZATION WITH CORONARY ANGIOGRAM;  Surgeon: Blane Ohara, MD;  Location: [facility] CATH LAB;  Service: Cardiovascular;   Laterality: N/A;  . PERCUTANEOUS CORONARY STENT INTERVENTION (PCI-S)  04/12/2014   Procedure: PERCUTANEOUS CORONARY STENT INTERVENTION (PCI-S);  Surgeon: Blane Ohara, MD;  Location: [facility] CATH LAB;  Service: Cardiovascular;;  . PERCUTANEOUS CORONARY STENT INTERVENTION (PCI-S) N/A 04/14/2014   Procedure: PERCUTANEOUS CORONARY STENT INTERVENTION (PCI-S);  Surgeon: Peter M Martinique, MD;  Location: [facility] CATH LAB;  Service: Cardiovascular;  Laterality: N/A;  . TEE WITHOUT CARDIOVERSION N/A 06/06/2018   Procedure: TRANSESOPHAGEAL ECHOCARDIOGRAM (TEE);  Surgeon: Prescott Gum, Collier Salina, MD;  Location: [place] [facility];  Service: Open Heart Surgery;  Laterality: N/A;     Medications: Current Meds  Medication Sig  . amLODipine (NORVASC) 5 MG tablet Take 5 mg by mouth daily.   Marland Kitchen aspirin EC 81 MG tablet Take 81 mg by mouth daily.  Marland Kitchen atorvastatin (LIPITOR) 40 MG tablet Take 1 tablet (40 mg total) by mouth daily at 6 PM.  . docusate sodium (COLACE) 250 MG capsule Take 250 mg by mouth daily as needed for constipation.   Marland Kitchen HYDROcodone-acetaminophen (NORCO/VICODIN) 5-325 MG per tablet Take 1 tablet by mouth 3 (three) times daily as needed for moderate pain.   . meloxicam (MOBIC) 15 MG tablet Take 15 mg by mouth daily.  . metoprolol succinate (TOPROL-XL) 50 MG 24 hr tablet Take 1 tablet (50 mg total) by mouth daily.  . nitroGLYCERIN (NITROSTAT) 0.4 MG SL tablet Place 1 tablet (0.4 mg total) under the tongue every 5 (five) minutes as needed for chest pain.  . pantoprazole (PROTONIX) 40 MG tablet Take 1 tablet (40 mg total) by mouth daily.  . traMADol (ULTRAM) 50 MG tablet Take 2 tablets (100 mg total) by mouth 2 (two) times daily.     Allergies: No Known Allergies  Social History: The patient  reports that he quit smoking about 36 years ago. He has never used smokeless tobacco. He reports that he does not drink alcohol or use drugs.   Family History: The patient's family history includes Coronary artery disease (age of  onset: 55) in his mother.   Review of Systems: Please see the history of present illness.   All other systems are reviewed and negative.   Physical Exam: VS:  BP 126/70   Pulse 86   Ht 5\' 10"  (1.778 m)   Wt 264 lb (119.7 kg)   BMI 37.88 kg/m  .  BMI Body mass index is 37.88 kg/m.  Wt Readings from Last 3 Encounters:  11/10/19 264 lb (119.7 kg)  07/08/19 260 lb (117.9 kg)  05/12/19 261 lb (118.4 kg)    General: Pleasant. Alert and in no acute distress.   HEENT: Normal.  Neck: Supple, no JVD,  carotid bruits, or masses noted.  Cardiac: Regular rate and rhythm. Heart tones are distant. Trace bilateral edema.  Respiratory:  Lungs are clear to auscultation bilaterally with normal work of breathing.  GI: Soft and nontender. Abdomen remains obese.  MS: No deformity or atrophy. Gait and ROM intact.  Skin: Warm and dry. Color is normal.  Neuro:  Strength and sensation are intact and no gross focal deficits noted.  Psych: Alert, appropriate and with normal affect.   LABORATORY DATA:  EKG:  EKG is ordered today. This demonstrates NSR with LAE noted.  Lab Results  Component Value Date   WBC 5.2 07/16/2018   HGB 12.9 (L) 07/16/2018   HCT 39.0 07/16/2018   PLT 253 07/16/2018   GLUCOSE 114 (H) 07/16/2018   CHOL 136 07/16/2018   TRIG 79 07/16/2018   HDL 46 07/16/2018   LDLCALC 74 07/16/2018   ALT 17 07/16/2018   AST 16 07/16/2018   NA 140 07/16/2018   K 4.6 07/16/2018   CL 102 07/16/2018   CREATININE 1.23 07/16/2018   BUN 18 07/16/2018   CO2 22 07/16/2018   TSH 4.936 (H) 06/11/2018   INR 1.21 06/06/2018   HGBA1C 6.0 (H) 06/05/2018     BNP (last 3 results) No results for input(s): BNP in the last 8760 hours.  ProBNP (last 3 results) No results for input(s): PROBNP in the last 8760 hours.   Other Studies Reviewed Today:  LEFT HEART CATH AND CORONARY ANGIOGRAPHY7/2019  Conclusion     The left ventricular systolic function is normal.  LV end diastolic  pressure is mildly elevated.  The left ventricular ejection fraction is 55-65% by visual estimate.  Ost LAD lesion is 90% stenosed.  Prox LAD lesion is 50% stenosed.  Mid LAD lesion is 40% stenosed.  2nd Diag lesion is 40% stenosed.  Ost 1st Mrg lesion is 50% stenosed.  Previously placed 1st Mrg stent (unknown type) is widely patent.  Previously placed Prox RCA to Mid RCA stent (unknown type) is widely patent.  Previously placed Mid RCA to Dist RCA stent (unknown type) is widely patent.  Mid RCA lesion is 50% stenosed.  1. Severe ostial LAD stenosis with mild to moderate proximal and mid LAD stenosis 2. Patent left main with no significant stenosis 3. Patent left circumflex stent (first OM) with moderate stenosis proximal to the stented segment 4. Patent RCA stents with mild to moderate stenosis just between the stents in the mid RCA 5. Normal LV function  Recommendations: Recommend cardiac surgical consultation for consideration of CABG. Patient has diffuse coronary artery disease and severe stenosis involving the true ostium of the LAD. I think he would benefit from surgical revascularization. He will be instructed to discontinue clopidogrel in anticipation of CABG. Will refer for outpatient surgical consult.    CABG07/10/2018  PROCEDURE PERFORMED: 1. Coronary artery bypass grafting x3 (left internal mammary artery to left anterior descending, saphenous vein graft to diagonal, saphenous vein graft to circumflex marginal). 2. Endoscopic harvest of right leg greater saphenous vein.   ASSESSMENT & PLAN:    1. DOE/swelling - will arrange for echocardiogram - checking labs today. Would try to stop the Mobic if he can. Stop using salt shaker as well. Further disposition to follow.   2. CAD with prior NSTEMI with DES x 2 to the RCA in 2015, s/p CABG x 3 from July of 2019. No active chest pain but having more DOE - see #1.   3. HLD - needs lab  today.   4.  Prior abnormal TSH - rechecking today.   5. HTN - BP is ok on current regimen. He is on Norvasc - this could contribute to his swelling but I think there are other factors at play here we will address first.   6. Post op AF - remains in NSR by exam and EKG today. LAE noted on EKG. Borderline 1st degree AV block.   7. Obesity - encouraged activity. This has been difficult for him.   8. OA - his most limiting factor. Trying to stop Mobic due to swelling.   9. COVID-19 Education: The signs and symptoms of COVID-19 were discussed with the patient and how to seek care for testing (follow up with PCP or arrange E-visit).  The importance of social distancing, staying at home, hand hygiene and wearing a mask when out in public were discussed today.  Current medicines are reviewed with the patient today.  The patient does not have concerns regarding medicines other than what has been noted above.  The following changes have been made:  See above.  Labs/ tests ordered today include:    Orders Placed This Encounter  Procedures  . Basic metabolic panel  . CBC no Diff  . Hepatic function panel  . TSH  . Lipid Profile  . Pro b natriuretic peptide  . EKG 12-Lead  . ECHOCARDIOGRAM COMPLETE     Disposition:   FU with me in about 4 to 6 weeks. May need to consider pulmonary referral if our studies are ok.   Patient is agreeable to this plan and will call if any problems develop in the interim.   SignedTruitt Merle, NP  11/10/2019 10:50 AM  [facility] [address] [place], [place]  [address] Phone: [phone] Fax: [phone]

## 2019-11-09 IMAGING — CR DG CHEST 2V
2 series · 2 of 2 positions shown · non-contrast
Comparison: Radiographs June 12, 2018.

CLINICAL DATA: Status post coronary artery bypass graft.

EXAM:
CHEST - 2 VIEW

[w chest pa *]
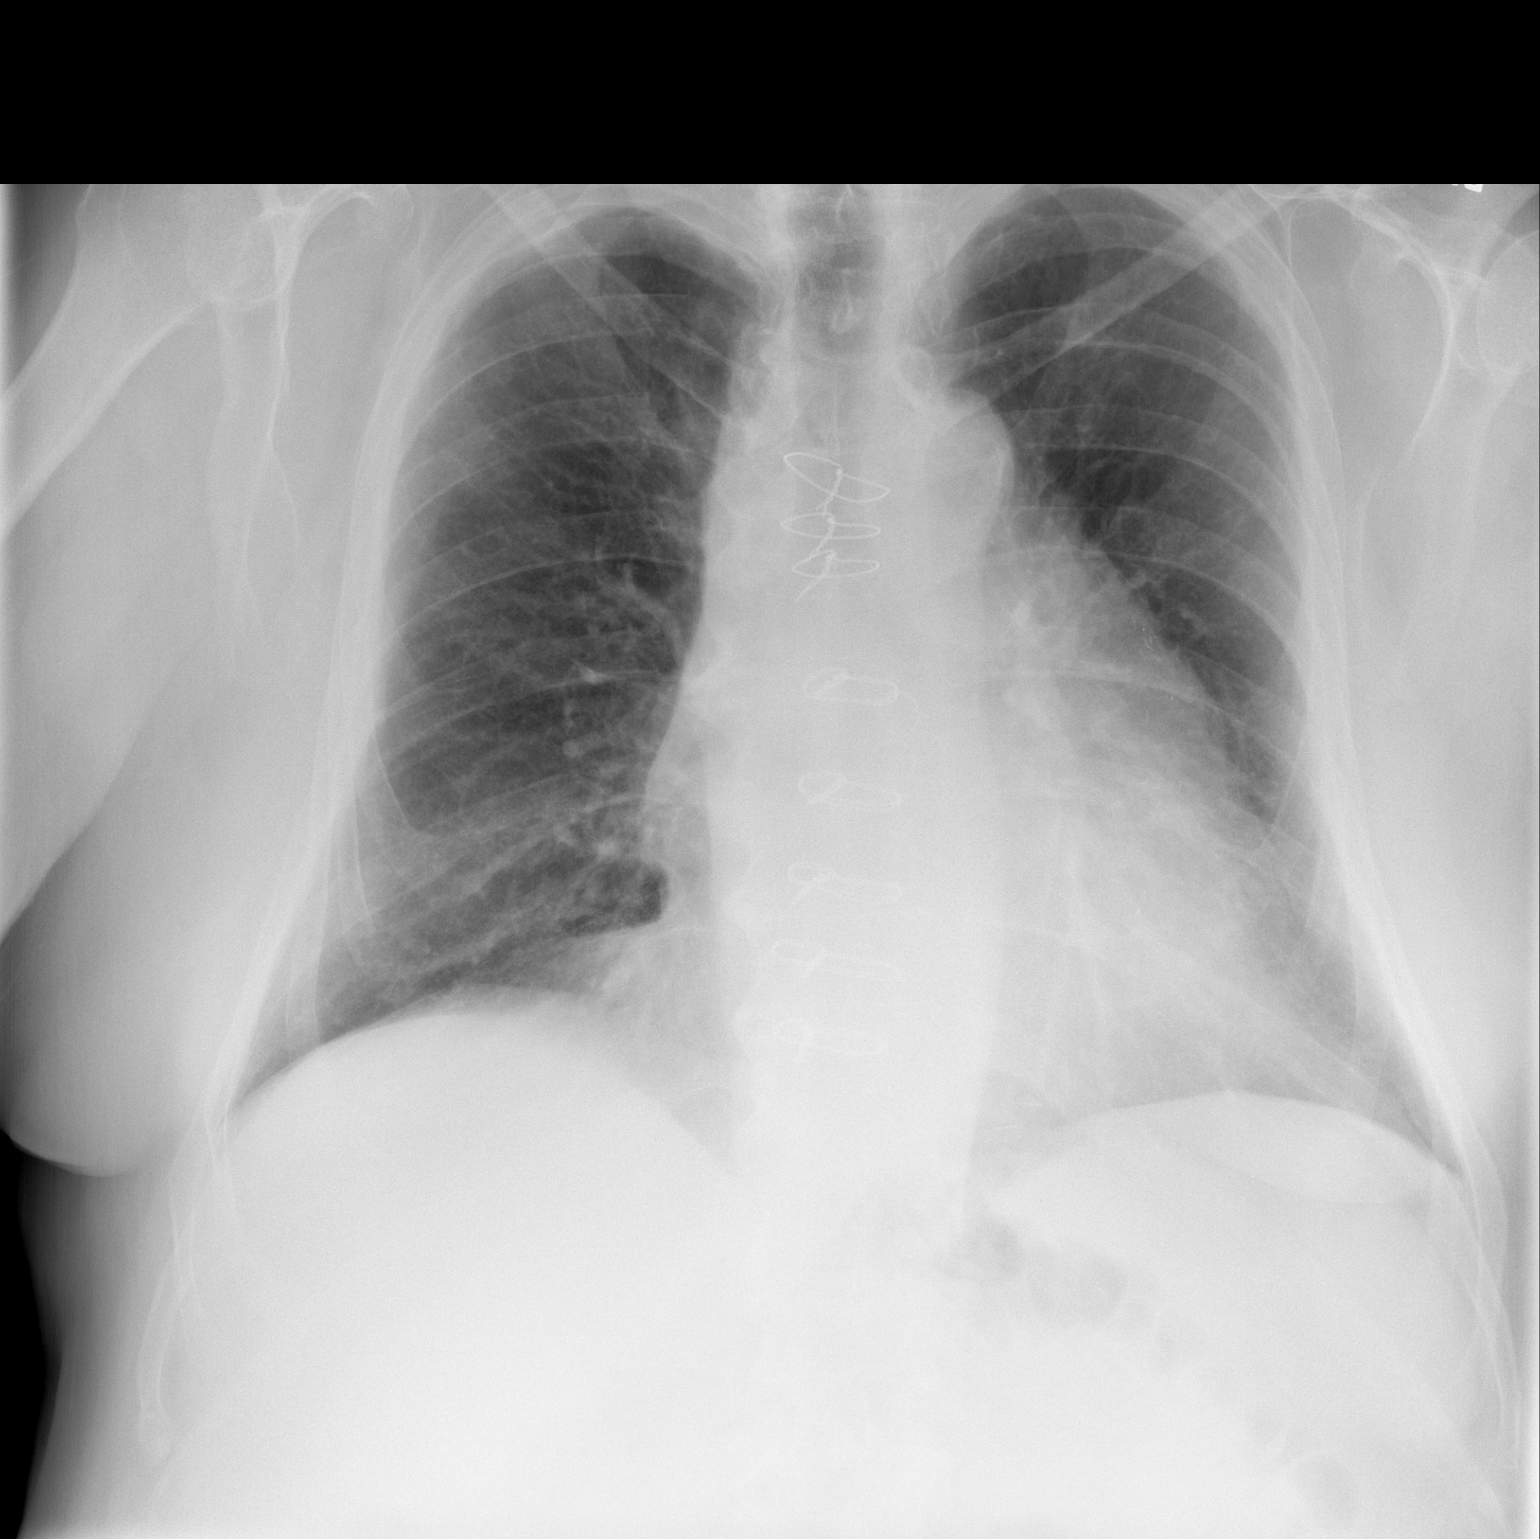

[w chest lat *]
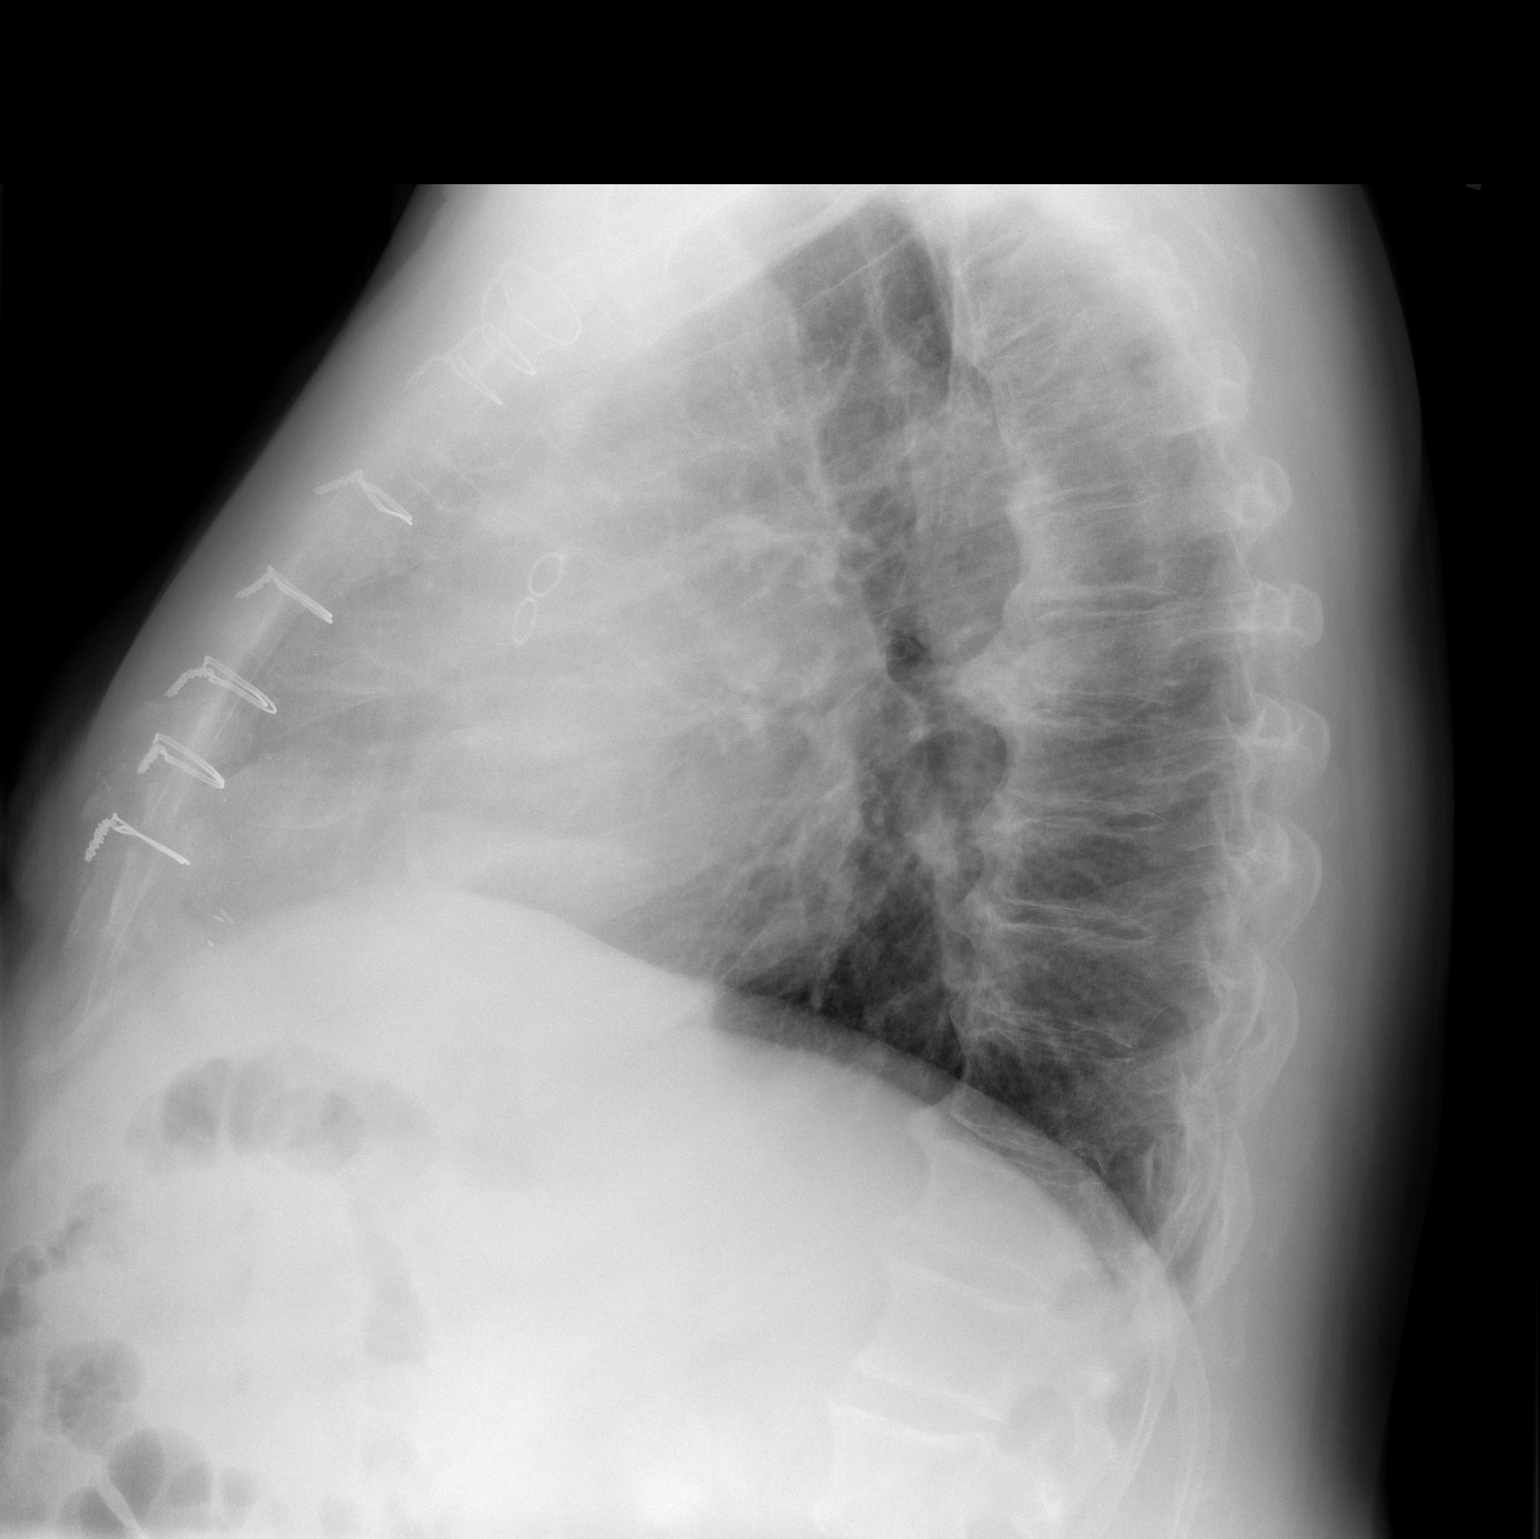

[2 of 2 positions shown; findings below may reference images not displayed]

FINDINGS: Stable cardiomegaly. Status post coronary bypass graft. No
pneumothorax or pleural effusion is noted. No acute pulmonary
disease is noted. Bony thorax is unremarkable.
IMPRESSION: No active cardiopulmonary disease.

## 2019-11-10 ENCOUNTER — Other Ambulatory Visit: Payer: Self-pay

## 2019-11-10 ENCOUNTER — Ambulatory Visit: Payer: Medicare HMO | Admitting: Nurse Practitioner

## 2019-11-10 ENCOUNTER — Encounter: Payer: Self-pay | Admitting: Nurse Practitioner

## 2019-11-10 VITALS — BP 126/70 | HR 86 | Ht 70.0 in | Wt 264.0 lb

## 2019-11-10 DIAGNOSIS — I2510 Atherosclerotic heart disease of native coronary artery without angina pectoris: Secondary | ICD-10-CM

## 2019-11-10 DIAGNOSIS — E782 Mixed hyperlipidemia: Secondary | ICD-10-CM | POA: Diagnosis not present

## 2019-11-10 DIAGNOSIS — R0609 Other forms of dyspnea: Secondary | ICD-10-CM

## 2019-11-10 DIAGNOSIS — R0600 Dyspnea, unspecified: Secondary | ICD-10-CM

## 2019-11-10 DIAGNOSIS — Z951 Presence of aortocoronary bypass graft: Secondary | ICD-10-CM | POA: Diagnosis not present

## 2019-11-10 DIAGNOSIS — Z7189 Other specified counseling: Secondary | ICD-10-CM

## 2019-11-10 DIAGNOSIS — I10 Essential (primary) hypertension: Secondary | ICD-10-CM

## 2019-11-10 NOTE — Patient Instructions (Addendum)
After Visit Summary:  We will be checking the following labs today - BMET, CBC, HPF, Lipids, TSH and BNP   Medication Instructions:    Continue with your current medicines.   Try to stop the Mobic if you can   If you need a refill on your cardiac medications before your next appointment, please call your pharmacy.     Testing/Procedures To Be Arranged:  Echocardiogram   Follow-Up:   See me in about 4 to 6 weeks    At [facility], you and your health needs are our priority.  As part of our continuing mission to provide you with exceptional heart care, we have created designated Provider Care Teams.  These Care Teams include your primary Cardiologist (physician) and Advanced Practice Providers (APPs -  Physician Assistants and Nurse Practitioners) who all work together to provide you with the care you need, when you need it.  Special Instructions:  . Stay safe, stay home, wash your hands for at least 20 seconds and wear a mask when out in public.  . It was good to talk with you today.    Call the [place] office at [phone] if you have any questions, problems or concerns.

## 2019-11-11 LAB — CBC
Hematocrit: 38.9 % (ref 37.5–51.0)
Hemoglobin: 13 g/dL (ref 13.0–17.7)
MCH: 31.9 pg (ref 26.6–33.0)
MCHC: 33.4 g/dL (ref 31.5–35.7)
MCV: 95 fL (ref 79–97)
Platelets: 202 10*3/uL (ref 150–450)
RBC: 4.08 x10E6/uL — ABNORMAL LOW (ref 4.14–5.80)
RDW: 11.8 % (ref 11.6–15.4)
WBC: 4.2 10*3/uL (ref 3.4–10.8)

## 2019-11-11 LAB — LIPID PANEL
Chol/HDL Ratio: 2.9 ratio (ref 0.0–5.0)
Cholesterol, Total: 104 mg/dL (ref 100–199)
HDL: 36 mg/dL — ABNORMAL LOW (ref >39)
LDL Chol Calc (NIH): 47 mg/dL (ref 0–99)
Triglycerides: 114 mg/dL (ref 0–149)
VLDL Cholesterol Cal: 21 mg/dL (ref 5–40)

## 2019-11-11 LAB — BASIC METABOLIC PANEL
BUN/Creatinine Ratio: 13 (ref 10–24)
BUN: 12 mg/dL (ref 8–27)
CO2: 21 mmol/L (ref 20–29)
Calcium: 8.8 mg/dL (ref 8.6–10.2)
Chloride: 105 mmol/L (ref 96–106)
Creatinine, Ser: 0.93 mg/dL (ref 0.76–1.27)
GFR calc Af Amer: 91 mL/min/{1.73_m2} (ref >59)
GFR calc non Af Amer: 79 mL/min/{1.73_m2} (ref >59)
Glucose: 135 mg/dL — ABNORMAL HIGH (ref 65–99)
Potassium: 4.2 mmol/L (ref 3.5–5.2)
Sodium: 139 mmol/L (ref 134–144)

## 2019-11-11 LAB — TSH: TSH: 3.04 u[IU]/mL (ref 0.450–4.500)

## 2019-11-11 LAB — HEPATIC FUNCTION PANEL
ALT: 20 IU/L (ref 0–44)
AST: 20 IU/L (ref 0–40)
Albumin: 3.6 g/dL — ABNORMAL LOW (ref 3.7–4.7)
Alkaline Phosphatase: 94 IU/L (ref 39–117)
Bilirubin Total: 0.4 mg/dL (ref 0.0–1.2)
Bilirubin, Direct: 0.13 mg/dL (ref 0.00–0.40)
Total Protein: 6.6 g/dL (ref 6.0–8.5)

## 2019-11-11 LAB — PRO B NATRIURETIC PEPTIDE: NT-Pro BNP: 172 pg/mL (ref 0–486)

## 2019-11-24 ENCOUNTER — Other Ambulatory Visit: Payer: Self-pay

## 2019-11-24 ENCOUNTER — Ambulatory Visit (HOSPITAL_COMMUNITY): Payer: Medicare HMO | Attending: Cardiovascular Disease

## 2019-11-24 DIAGNOSIS — R0600 Dyspnea, unspecified: Secondary | ICD-10-CM

## 2019-11-24 DIAGNOSIS — R0609 Other forms of dyspnea: Secondary | ICD-10-CM

## 2019-12-11 NOTE — Progress Notes (Signed)
CARDIOLOGY OFFICE NOTE  Date:  12/15/2019    Allen James Date of Birth: 1941-03-09 Medical Record [id number]  PCP:  Delilah Shan, MD  Cardiologist:  Jerel Shepherd    Chief Complaint  Patient presents with  . Follow-up    Seen for Dr. Burt Knack    History of Present Illness: Allen James is a 79 y.o. male who presents today for a 98month check.  Seen for Dr. Burt Knack.   He has a known history of HLD and known CAD with prior NSTEMI with DES to RCA x 2 in 2015.In June of 2019 he had progressive angina - was cathed and referred for CABG x 3 per PVT in July of 2019 [place] to LAD, SVG to Diagonal, and SVG to OM and endoscopic harvest of greater saphenous vein from his right leg. Post op course was noted for PAF, AKI and volume overload.   Dr. Burt Knack and I have followed him since - we have stopped amiodarone and Lasix. Last seen in December - noted more swelling - using more salt - we got his echo updated - EF is ok - he does have some diastolic dysfunction. He was worried about overt CHF.   The patient does not have symptoms concerning for COVID-19 infection (fever, chills, cough, or new shortness of breath).   Comes in today. Here alone. He feels good. No more swelling. Trying to be more mindful with his salt. Happy about how his echo turned out. No chest pain. Breathing is good. He is trying to be more active but the weather limits this. He is asking about seeing GI - saw [facility] in the remote past - having more belching and more loose stools. He is on PPI. Worried about an ulcer. Remains on Mobic - his choice. No bleeding noted.   Past Medical History:  Diagnosis Date  . Abdominal hernia    pt. states he had it for 10 yrs or longer  . Abnormal findings on cardiac catheterization    ! Do NOT use RIGHT radial access on future caths or PCI.  Marland Kitchen Acute non-ST elevation myocardial infarction (NSTEMI) ([place]) 04/12/2014  . Acute non-ST segment elevation myocardial infarction  ([place]) 04/12/2014  . Adiposity 04/14/2014  . AKI (acute kidney injury) ([place]) 04/14/2014  . Anginal pain ([place])   . Arthritis   . Back pain   . Benign essential hypertension 12/20/2016  . CAD (coronary artery disease)    a. NSTEMI 04/12/14 s/p DES x 2 to RCA. Prox 3.5x38 mm Xience Alpine DES, Dist 3.5x28 mm Xience DES; EF 65-70%. Staged PCI to the OM1 with 2.75 x 12 mm Resolute DES     . Chronic coronary artery disease 04/14/2014  . Chronic lower back pain 07/13/2014  . Dyspepsia 03/07/2016  . Dyspnea   . GERD (gastroesophageal reflux disease)   . H/O hiatal hernia   . Hyperlipidemia   . Injury of kidney 04/14/2014  . Muscle soreness 04/27/2014  . NSTEMI (non-ST elevated myocardial infarction) ([place]) 04/12/2014  . Obesity 04/14/2014  . Obstructive apnea 12/28/2014  . OSA on CPAP   . Osteoarthrosis 09/30/2014  . Primary insomnia 03/07/2016  . Pure hypercholesterolemia 12/28/2014  . SLEEP APNEA 02/24/2009   Qualifier: Diagnosis of  By: Annamaria Boots MD, Kasandra Knudsen     Past Surgical History:  Procedure Laterality Date  . APPENDECTOMY     Remote  . CARPAL TUNNEL RELEASE Bilateral   . CORONARY ANGIOPLASTY  04/12/14  STENT TO RCA  . CORONARY ARTERY BYPASS GRAFT N/A 06/06/2018   Procedure: CORONARY ARTERY BYPASS GRAFTING (CABG) x 3 WITH ENDOSCOPIC HARVESTING OF RIGHT SAPHENOUS VEIN;  Surgeon: Ivin Poot, MD;  Location: [facility];  Service: Open Heart Surgery;  Laterality: N/A;  . KNEE ARTHROSCOPY Bilateral   . LEFT HEART CATH AND CORONARY ANGIOGRAPHY N/A 05/30/2018   Procedure: LEFT HEART CATH AND CORONARY ANGIOGRAPHY;  Surgeon: Sherren Mocha, MD;  Location: [facility] CV LAB;  Service: Cardiovascular;  Laterality: N/A;  . LEFT HEART CATHETERIZATION WITH CORONARY ANGIOGRAM N/A 04/12/2014   Procedure: LEFT HEART CATHETERIZATION WITH CORONARY ANGIOGRAM;  Surgeon: Blane Ohara, MD;  Location: [facility] CATH LAB;  Service: Cardiovascular;  Laterality: N/A;  . PERCUTANEOUS CORONARY STENT INTERVENTION (PCI-S)  04/12/2014     Procedure: PERCUTANEOUS CORONARY STENT INTERVENTION (PCI-S);  Surgeon: Blane Ohara, MD;  Location: [facility] CATH LAB;  Service: Cardiovascular;;  . PERCUTANEOUS CORONARY STENT INTERVENTION (PCI-S) N/A 04/14/2014   Procedure: PERCUTANEOUS CORONARY STENT INTERVENTION (PCI-S);  Surgeon: Peter M Martinique, MD;  Location: [facility] CATH LAB;  Service: Cardiovascular;  Laterality: N/A;  . TEE WITHOUT CARDIOVERSION N/A 06/06/2018   Procedure: TRANSESOPHAGEAL ECHOCARDIOGRAM (TEE);  Surgeon: Prescott Gum, Collier Salina, MD;  Location: [facility];  Service: Open Heart Surgery;  Laterality: N/A;     Medications: Current Meds  Medication Sig  . amLODipine (NORVASC) 5 MG tablet Take 5 mg by mouth daily.   Marland Kitchen aspirin EC 81 MG tablet Take 81 mg by mouth daily.  Marland Kitchen atorvastatin (LIPITOR) 40 MG tablet Take 1 tablet (40 mg total) by mouth daily at 6 PM.  . docusate sodium (COLACE) 250 MG capsule Take 250 mg by mouth daily as needed for constipation.   Marland Kitchen HYDROcodone-acetaminophen (NORCO/VICODIN) 5-325 MG per tablet Take 1 tablet by mouth 3 (three) times daily as needed for moderate pain.   . meloxicam (MOBIC) 15 MG tablet Take 15 mg by mouth daily.  . metoprolol succinate (TOPROL-XL) 50 MG 24 hr tablet Take 1 tablet (50 mg total) by mouth daily.  . nitroGLYCERIN (NITROSTAT) 0.4 MG SL tablet Place 1 tablet (0.4 mg total) under the tongue every 5 (five) minutes as needed for chest pain.  . pantoprazole (PROTONIX) 40 MG tablet Take 1 tablet (40 mg total) by mouth daily.  . traMADol (ULTRAM) 50 MG tablet Take 2 tablets (100 mg total) by mouth 2 (two) times daily.     Allergies: No Known Allergies  Social History: The patient  reports that he quit smoking about 37 years ago. He has never used smokeless tobacco. He reports that he does not drink alcohol or use drugs.   Family History: The patient's family history includes Coronary artery disease (age of onset: 58) in his mother.   Review of Systems: Please see the history of present  illness.   All other systems are reviewed and negative.   Physical Exam: VS:  BP 122/76   Pulse 82   Ht 5\' 10"  (1.778 m)   Wt 263 lb (119.3 kg)   SpO2 97%   BMI 37.74 kg/m  .  BMI Body mass index is 37.74 kg/m.  Wt Readings from Last 3 Encounters:  12/15/19 263 lb (119.3 kg)  11/10/19 264 lb (119.7 kg)  07/08/19 260 lb (117.9 kg)    General: Pleasant. Alert and in no acute distress. Remains obese.   HEENT: Normal.  Neck: Supple, no JVD, carotid bruits, or masses noted.  Cardiac: Regular rate and rhythm. No murmurs, rubs,  or gallops. No edema.  Respiratory:  Lungs are clear to auscultation bilaterally with normal work of breathing.  GI: Soft and nontender.  MS: No deformity or atrophy. Gait and ROM intact.  Skin: Warm and dry. Color is normal.  Neuro:  Strength and sensation are intact and no gross focal deficits noted.  Psych: Alert, appropriate and with normal affect.   LABORATORY DATA:  EKG:  EKG is not ordered today.  Lab Results  Component Value Date   WBC 4.2 11/10/2019   HGB 13.0 11/10/2019   HCT 38.9 11/10/2019   PLT 202 11/10/2019   GLUCOSE 135 (H) 11/10/2019   CHOL 104 11/10/2019   TRIG 114 11/10/2019   HDL 36 (L) 11/10/2019   LDLCALC 47 11/10/2019   ALT 20 11/10/2019   AST 20 11/10/2019   NA 139 11/10/2019   K 4.2 11/10/2019   CL 105 11/10/2019   CREATININE 0.93 11/10/2019   BUN 12 11/10/2019   CO2 21 11/10/2019   TSH 3.040 11/10/2019   INR 1.21 06/06/2018   HGBA1C 6.0 (H) 06/05/2018     BNP (last 3 results) No results for input(s): BNP in the last 8760 hours.  ProBNP (last 3 results) Recent Labs    11/10/19 1117  PROBNP 172     Other Studies Reviewed Today:  ECHO IMPRESSIONS 10/2019   1. Left ventricular ejection fraction, by visual estimation, is 55 to 60%. The left ventricle has normal function. There is mildly increased left ventricular hypertrophy.  2. Abnormal septal motion consistent with post-operative status.  3. Left  ventricular diastolic parameters are consistent with Grade I diastolic dysfunction (impaired relaxation).  4. The left ventricle has no regional wall motion abnormalities.  5. Global right ventricle has normal systolic function.The right ventricular size is normal. No increase in right ventricular wall thickness.  6. Left atrial size was normal.  7. Right atrial size was normal.  8. Presence of pericardial fat pad.  9. Trivial pericardial effusion is present. 10. Mild mitral annular calcification. 11. The mitral valve is degenerative. Trivial mitral valve regurgitation. No evidence of mitral stenosis. 12. The tricuspid valve is grossly normal. 13. The aortic valve is tricuspid. Aortic valve regurgitation is mild. No evidence of aortic valve sclerosis or stenosis. 14. Pulmonic regurgitation is mild. 15. The pulmonic valve was grossly normal. Pulmonic valve regurgitation is mild. 16. Aortic dilatation noted. 17. 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 18. No significant change from prior study.    LEFT HEART CATH AND CORONARY ANGIOGRAPHY7/2019  Conclusion     The left ventricular systolic function is normal.  LV end diastolic pressure is mildly elevated.  The left ventricular ejection fraction is 55-65% by visual estimate.  Ost LAD lesion is 90% stenosed.  Prox LAD lesion is 50% stenosed.  Mid LAD lesion is 40% stenosed.  2nd Diag lesion is 40% stenosed.  Ost 1st Mrg lesion is 50% stenosed.  Previously placed 1st Mrg stent (unknown type) is widely patent.  Previously placed Prox RCA to Mid RCA stent (unknown type) is widely patent.  Previously placed Mid RCA to Dist RCA stent (unknown type) is widely patent.  Mid RCA lesion is 50% stenosed.  1. Severe ostial LAD stenosis with mild to moderate proximal and mid LAD stenosis 2. Patent left main with no significant stenosis 3. Patent left circumflex stent (first OM) with moderate stenosis  proximal to the stented segment 4. Patent RCA stents with mild to moderate stenosis just  between the stents in the mid RCA 5. Normal LV function  Recommendations: Recommend cardiac surgical consultation for consideration of CABG. Patient has diffuse coronary artery disease and severe stenosis involving the true ostium of the LAD. I think he would benefit from surgical revascularization. He will be instructed to discontinue clopidogrel in anticipation of CABG. Will refer for outpatient surgical consult.    CABG07/10/2018  PROCEDURE PERFORMED: 1. Coronary artery bypass grafting x3 (left internal mammary artery to left anterior descending, saphenous vein graft to diagonal, saphenous vein graft to circumflex marginal). 2. Endoscopic harvest of right leg greater saphenous vein.   ASSESSMENT & PLAN:   1. Prior DOE/swelling - seems improved. Echo is stable. Needs salt restriction and continued activity.   2. CAD with prior NSTEMI with DES x 2 to RCA from 2015 and s/p CABG x 3 from July of 2019. Doing well clinically. Needs aggressive CV risk factor modification.   3. HLD - on statin  4. Obesity - encouragement given.   5. HTN - BP is fine. No changes made today. Remains on Norvasc - I do not think this was contributing to his swelling.   6. History of post op AF - remains in sinus by exam today.   7. OA - remains on Mobic  8. GI issues - will refer back to [facility] - seen there in the remote past.   9. COVID-19 Education: The signs and symptoms of COVID-19 were discussed with the patient and how to seek care for testing (follow up with PCP or arrange E-visit).  The importance of social distancing, staying at home, hand hygiene and wearing a mask when out in public were discussed today. Information on how to obtain vaccine given today.    Current medicines are reviewed with the patient today.  The patient does not have concerns regarding medicines other than what has been  noted above.  The following changes have been made:  See above.  Labs/ tests ordered today include:    Orders Placed This Encounter  Procedures  . Ambulatory referral to Gastroenterology     Disposition:   FU with [place] in 6 months.   Patient is agreeable to this plan and will call if any problems develop in the interim.   SignedTruitt Merle, NP  12/15/2019 10:24 AM  [facility] [address] [place], [place]  [address] Phone: [phone] Fax: [phone]

## 2019-12-15 ENCOUNTER — Encounter: Payer: Self-pay | Admitting: Nurse Practitioner

## 2019-12-15 ENCOUNTER — Ambulatory Visit: Payer: Medicare HMO | Admitting: Nurse Practitioner

## 2019-12-15 ENCOUNTER — Other Ambulatory Visit: Payer: Self-pay

## 2019-12-15 VITALS — BP 122/76 | HR 82 | Ht 70.0 in | Wt 263.0 lb

## 2019-12-15 DIAGNOSIS — R195 Other fecal abnormalities: Secondary | ICD-10-CM | POA: Diagnosis not present

## 2019-12-15 DIAGNOSIS — E782 Mixed hyperlipidemia: Secondary | ICD-10-CM | POA: Diagnosis not present

## 2019-12-15 DIAGNOSIS — I2510 Atherosclerotic heart disease of native coronary artery without angina pectoris: Secondary | ICD-10-CM

## 2019-12-15 DIAGNOSIS — I10 Essential (primary) hypertension: Secondary | ICD-10-CM

## 2019-12-15 DIAGNOSIS — R142 Eructation: Secondary | ICD-10-CM | POA: Diagnosis not present

## 2019-12-15 DIAGNOSIS — Z951 Presence of aortocoronary bypass graft: Secondary | ICD-10-CM

## 2019-12-15 DIAGNOSIS — Z7189 Other specified counseling: Secondary | ICD-10-CM

## 2019-12-15 NOTE — Patient Instructions (Addendum)
After Visit Summary:  We will be checking the following labs today - NONE   Medication Instructions:    Continue with your current medicines.    If you need a refill on your cardiac medications before your next appointment, please call your pharmacy.     Testing/Procedures To Be Arranged:  N/A  Follow-Up:  See Dr. Burt Knack in 6 months - You will receive a reminder letter in the mail two months in advance. If you don't receive a letter, please call our office to schedule the follow-up appointment.    I have placed an order to be referred to [facility] - they should be in touch with you with an appointment.     At [facility], you and your health needs are our priority.  As part of our continuing mission to provide you with exceptional heart care, we have created designated Provider Care Teams.  These Care Teams include your primary Cardiologist (physician) and Advanced Practice Providers (APPs -  Physician Assistants and Nurse Practitioners) who all work together to provide you with the care you need, when you need it.  Special Instructions:  . Stay safe, stay home, wash your hands for at least 20 seconds and wear a mask when out in public.  . It was good to talk with you today.   We are recommending the COVID 19 vaccine to ALL of our patients. Cardiac medications (including blood thinners) should not deter anyone from being vaccinated. There is no need to hold any of these medicines prior to vaccine administration.   If you are interested in the COVID 19 vaccine - call the [facility] Department at [phone] and select Option #2. Appointments can be arranged at various locations but eventually all will be moved to the [facility]. No walk-ins will be accepted.   Visit [email or web address] and clinic on the "COVID 19 Vaccine Info" for more information.   COVID-19 Vaccine Information can also be found at:  [email or web address]   For questions related to vaccine distribution or appointments, please email [email or web address] or call [phone].   A waitlist has been offered for those seeking to make an appointment - please go to [email or web address]    Call the [facility] office at [phone] if you have any questions, problems or concerns.

## 2020-02-04 ENCOUNTER — Ambulatory Visit: Payer: Medicare HMO | Attending: Internal Medicine

## 2020-02-04 DIAGNOSIS — Z23 Encounter for immunization: Secondary | ICD-10-CM

## 2020-02-04 NOTE — Progress Notes (Signed)
   Covid-19 Vaccination Clinic  Name:  QUINTAVIOUS NAUSS    MRN: [id number] DOB: [birth year]  02/04/2020  Mr. Mercer was observed post Covid-19 immunization for 15 minutes without incident. He was provided with Vaccine Information Sheet and instruction to access the V-Safe system.   Mr. Tait was instructed to call 911 with any severe reactions post vaccine: Marland Kitchen Difficulty breathing  . Swelling of face and throat  . A fast heartbeat  . A bad rash all over body  . Dizziness and weakness   Immunizations Administered    Name Date Dose VIS Date Route   Pfizer COVID-19 Vaccine 02/04/2020 12:05 PM 0.3 mL 11/06/2019 Intramuscular   Manufacturer: [place]   Lot: [id number]   [place]: [id number]

## 2020-03-01 ENCOUNTER — Ambulatory Visit: Payer: Medicare HMO | Attending: Internal Medicine

## 2020-03-01 DIAGNOSIS — Z23 Encounter for immunization: Secondary | ICD-10-CM

## 2020-03-01 NOTE — Progress Notes (Signed)
   Covid-19 Vaccination Clinic  Name:  JAMIAN HUGEL    MRN: [id number] DOB: [birth year]  03/01/2020  Mr. Azoulay was observed post Covid-19 immunization for 15 minutes without incident. He was provided with Vaccine Information Sheet and instruction to access the V-Safe system.   Mr. Lessman was instructed to call 911 with any severe reactions post vaccine: Marland Kitchen Difficulty breathing  . Swelling of face and throat  . A fast heartbeat  . A bad rash all over body  . Dizziness and weakness   Immunizations Administered    Name Date Dose VIS Date Route   Pfizer COVID-19 Vaccine 03/01/2020  8:21 AM 0.3 mL 11/06/2019 Intramuscular   Manufacturer: [place]   Lot: [id number]   [place]: [id number]

## 2020-05-06 ENCOUNTER — Ambulatory Visit: Payer: Self-pay

## 2020-05-06 ENCOUNTER — Encounter: Payer: Self-pay | Admitting: Orthopaedic Surgery

## 2020-05-06 ENCOUNTER — Ambulatory Visit: Payer: Medicare HMO | Admitting: Orthopaedic Surgery

## 2020-05-06 ENCOUNTER — Other Ambulatory Visit: Payer: Self-pay

## 2020-05-06 DIAGNOSIS — M25532 Pain in left wrist: Secondary | ICD-10-CM

## 2020-05-06 NOTE — Progress Notes (Signed)
Subjective: He is here for left wrist injection.  Pain across the dorsum for the past couple weeks.  X-rays show substantial degenerative change.  Objective: He is tender to palpation near the scapholunate interval.  Procedure:  Ultrasound-guided left wrist injection: After sterile prep with Betadine, injected 3 cc 1% lidocaine without epinephrine and 40 mg methylprednisolone from dorsal approach into the radial scaphoid joint taking care to avoid tendons and neurovascular structures.  He had good immediate relief.

## 2020-05-06 NOTE — Progress Notes (Signed)
Office Visit Note   Patient: Allen James           Date of Birth: [birth year]           MRN: [id number] Visit Date: 05/06/2020              Requested by: Delilah Shan, MD [address] [place],  [place] [address] PCP: Delilah Shan, MD   Assessment & Plan: Visit Diagnoses:  1. Left wrist pain     Plan: Impression is posttraumatic arthritis left wrist.  His x-rays are consistent with sequelae from [facility] wrist.  I talked to him about a cortisone injection and immobilization with a wrist brace which she is agreeable to.  Dr. Junius Roads performed the cortisone injection under ultrasound today.  We will see the patient back as needed.  Follow-Up Instructions: No follow-ups on file.   Orders:  Orders Placed This Encounter  Procedures  . XR Wrist Complete Left  . US Guided Needle Placement   No orders of the defined types were placed in this encounter.     Procedures: No procedures performed   Clinical Data: No additional findings.   Subjective: Chief Complaint  Patient presents with  . Left Wrist - Edema    Allen James is a 79 year old gentleman comes in for evaluation of worsening left wrist pain.  He feels pain across the top of his wrist that is worse with extension and with pushing himself out of the chair.  He denies any numbness and tingling.  Denies history of gout.  Denies any trauma.   Review of Systems  Constitutional: Negative.   All other systems reviewed and are negative.    Objective: Vital Signs: There were no vitals taken for this visit.  Physical Exam Vitals and nursing note reviewed.  Constitutional:      Appearance: He is well-developed.  HENT:     Head: Normocephalic and atraumatic.  Eyes:     Pupils: Pupils are equal, round, and reactive to light.  Pulmonary:     Effort: Pulmonary effort is normal.  Abdominal:     Palpations: Abdomen is soft.  Musculoskeletal:        General: Normal range of motion.     Cervical back: Neck  supple.  Skin:    General: Skin is warm.  Neurological:     Mental Status: He is alert and oriented to person, place, and time.  Psychiatric:        Behavior: Behavior normal.        Thought Content: Thought content normal.        Judgment: Judgment normal.     Ortho Exam Left wrist shows moderate swelling.  He has reproducible pain with wrist extension.  Negative Finkelstein's. Specialty Comments:  No specialty comments available.  Imaging: US Guided Needle Placement  Result Date: 05/06/2020 Ultrasound-guided left wrist injection: After sterile prep with Betadine, injected 3 cc 1% lidocaine without epinephrine and 40 mg methylprednisolone from dorsal approach into the radial scaphoid joint taking care to avoid tendons and neurovascular structures.  He had good immediate relief.    PMFS History: Patient Active Problem List   Diagnosis Date Noted  . 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 Coronary artery disease 06/06/2018  . Benign essential hypertension 12/20/2016  . Dyspepsia 03/07/2016  . Primary insomnia 03/07/2016  . Obstructive apnea 12/28/2014  . Pure hypercholesterolemia 12/28/2014  . Osteoarthrosis 09/30/2014  . Chronic lower back pain 07/13/2014  . Muscle  soreness 04/27/2014  . CAD (coronary artery disease) 04/14/2014  . AKI (acute kidney injury) ([place]) 04/14/2014  . Obesity 04/14/2014  . Adiposity 04/14/2014  . Coronary artery disease involving native coronary artery with angina pectoris ([place]) 04/14/2014  . Injury of kidney 04/14/2014  . OSA on CPAP   . Hyperlipidemia   . NSTEMI (non-ST elevated myocardial infarction) ([place]) 04/12/2014  . Acute non-ST segment elevation myocardial infarction ([place]) 04/12/2014  . Acute non-ST elevation myocardial infarction (NSTEMI) ([place]) 04/12/2014  . HOARSENESS 10/18/2009  . SLEEP APNEA 02/24/2009   Past Medical History:  Diagnosis Date  . Abdominal hernia    pt. states he had it for 10 yrs or longer  . Abnormal findings on cardiac  catheterization    ! Do NOT use RIGHT radial access on future caths or PCI.  Marland Kitchen Acute non-ST elevation myocardial infarction (NSTEMI) ([place]) 04/12/2014  . Acute non-ST segment elevation myocardial infarction ([place]) 04/12/2014  . Adiposity 04/14/2014  . AKI (acute kidney injury) ([place]) 04/14/2014  . Anginal pain ([place])   . Arthritis   . Back pain   . Benign essential hypertension 12/20/2016  . CAD (coronary artery disease)    a. NSTEMI 04/12/14 s/p DES x 2 to RCA. Prox 3.5x38 mm Xience Alpine DES, Dist 3.5x28 mm Xience DES; EF 65-70%. Staged PCI to the OM1 with 2.75 x 12 mm Resolute DES     . Chronic coronary artery disease 04/14/2014  . Chronic lower back pain 07/13/2014  . Dyspepsia 03/07/2016  . Dyspnea   . GERD (gastroesophageal reflux disease)   . H/O hiatal hernia   . Hyperlipidemia   . Injury of kidney 04/14/2014  . Muscle soreness 04/27/2014  . NSTEMI (non-ST elevated myocardial infarction) ([place]) 04/12/2014  . Obesity 04/14/2014  . Obstructive apnea 12/28/2014  . OSA on CPAP   . Osteoarthrosis 09/30/2014  . Primary insomnia 03/07/2016  . Pure hypercholesterolemia 12/28/2014  . SLEEP APNEA 02/24/2009   Qualifier: Diagnosis of  By: Annamaria Boots MD, [place] D     Family History  Problem Relation Age of Onset  . Coronary artery disease Mother 36    Past Surgical History:  Procedure Laterality Date  . APPENDECTOMY     Remote  . CARPAL TUNNEL RELEASE Bilateral   . CORONARY ANGIOPLASTY  04/12/14   STENT TO RCA  . CORONARY ARTERY BYPASS GRAFT N/A 06/06/2018   Procedure: CORONARY ARTERY BYPASS GRAFTING (CABG) x 3 WITH ENDOSCOPIC HARVESTING OF RIGHT SAPHENOUS VEIN;  Surgeon: Ivin Poot, MD;  Location: [facility];  Service: Open Heart Surgery;  Laterality: N/A;  . KNEE ARTHROSCOPY Bilateral   . LEFT HEART CATH AND CORONARY ANGIOGRAPHY N/A 05/30/2018   Procedure: LEFT HEART CATH AND CORONARY ANGIOGRAPHY;  Surgeon: Sherren Mocha, MD;  Location: [facility] CV LAB;  Service: Cardiovascular;  Laterality: N/A;  .  LEFT HEART CATHETERIZATION WITH CORONARY ANGIOGRAM N/A 04/12/2014   Procedure: LEFT HEART CATHETERIZATION WITH CORONARY ANGIOGRAM;  Surgeon: Blane Ohara, MD;  Location: [facility] CATH LAB;  Service: Cardiovascular;  Laterality: N/A;  . PERCUTANEOUS CORONARY STENT INTERVENTION (PCI-S)  04/12/2014   Procedure: PERCUTANEOUS CORONARY STENT INTERVENTION (PCI-S);  Surgeon: Blane Ohara, MD;  Location: [facility] CATH LAB;  Service: Cardiovascular;;  . PERCUTANEOUS CORONARY STENT INTERVENTION (PCI-S) N/A 04/14/2014   Procedure: PERCUTANEOUS CORONARY STENT INTERVENTION (PCI-S);  Surgeon: Peter M Martinique, MD;  Location: [facility] CATH LAB;  Service: Cardiovascular;  Laterality: N/A;  . TEE WITHOUT CARDIOVERSION N/A 06/06/2018   Procedure: TRANSESOPHAGEAL ECHOCARDIOGRAM (TEE);  Surgeon: Prescott Gum, Collier Salina, MD;  Location: [facility];  Service: Open Heart Surgery;  Laterality: N/A;   Social History   Occupational History  . Not on file  Tobacco Use  . Smoking status: Former Smoker    Quit date: 11/26/1982    Years since quitting: 37.4  . Smokeless tobacco: Never Used  Substance and Sexual Activity  . Alcohol use: No  . Drug use: No  . Sexual activity: Not on file

## 2020-06-07 NOTE — Progress Notes (Signed)
CARDIOLOGY OFFICE NOTE  Date:  06/15/2020    Allen James Date of Birth: 1941-08-25 Medical Record #[id number]  PCP:  Delilah Shan, MD  Cardiologist:  Jerel Shepherd    Chief Complaint  Patient presents with  . Follow-up    Seen for Dr. Burt Knack    History of Present Illness: Allen James is a 79 y.o. male who presents today for a 6 month check. Seen for Dr. Burt Knack.   He has a known history of HLD and known CAD with prior NSTEMI with DES to RCA x 2 in 2015.In June of 2019 he had progressive angina - was cathed and referred for CABG x 3 per PVT in July of 2019 Garner to LAD, SVG to Diagonal, and SVG to OM and endoscopic harvest of greater saphenous vein from his right leg. Post op course was noted for PAF, AKI and volume overload.   Dr. Burt Knack and I have followed him since - we have stopped amiodarone and Lasix. When seen in December 2020 - noted more swelling - using more salt - we got his echo updated - EF is ok - he does have some diastolic dysfunction. He was worried about overt CHF. Last seen in January of 2021 and he was doing well. Trying to be more mindful of his sodium intake. Was having some GI issues and requested referral back to [facility].   Comes in today. Here alone. He feels like he is doing ok. Noting more DOE again - having to stop and rest. His weight is up 12 pounds since last visit. He says he is not using more salt. But he is eating more.  He has gotten pretty sedentary. Worse with the hot weather. His stationary bike is up in his attic. He basically just walks to the mailbox and that is about it. No actual chest pain - he did have chest pain with all his cardiac events. This has not recurred. BP is good. Needing new PCP.   Past Medical History:  Diagnosis Date  . Abdominal hernia    pt. states he had it for 10 yrs or longer  . Abnormal findings on cardiac catheterization    ! Do NOT use RIGHT radial access on future caths or PCI.  Marland Kitchen Acute  non-ST elevation myocardial infarction (NSTEMI) ([place]) 04/12/2014  . Acute non-ST segment elevation myocardial infarction ([place]) 04/12/2014  . Adiposity 04/14/2014  . AKI (acute kidney injury) ([place]) 04/14/2014  . Anginal pain ([place])   . Arthritis   . Back pain   . Benign essential hypertension 12/20/2016  . CAD (coronary artery disease)    a. NSTEMI 04/12/14 s/p DES x 2 to RCA. Prox 3.5x38 mm Xience Alpine DES, Dist 3.5x28 mm Xience DES; EF 65-70%. Staged PCI to the OM1 with 2.75 x 12 mm Resolute DES     . Chronic coronary artery disease 04/14/2014  . Chronic lower back pain 07/13/2014  . Dyspepsia 03/07/2016  . Dyspnea   . GERD (gastroesophageal reflux disease)   . H/O hiatal hernia   . Hyperlipidemia   . Injury of kidney 04/14/2014  . Muscle soreness 04/27/2014  . NSTEMI (non-ST elevated myocardial infarction) ([place]) 04/12/2014  . Obesity 04/14/2014  . Obstructive apnea 12/28/2014  . OSA on CPAP   . Osteoarthrosis 09/30/2014  . Primary insomnia 03/07/2016  . Pure hypercholesterolemia 12/28/2014  . SLEEP APNEA 02/24/2009   Qualifier: Diagnosis of  By: Annamaria Boots MD, Kasandra Knudsen  Past Surgical History:  Procedure Laterality Date  . APPENDECTOMY     Remote  . CARPAL TUNNEL RELEASE Bilateral   . CORONARY ANGIOPLASTY  04/12/14   STENT TO RCA  . CORONARY ARTERY BYPASS GRAFT N/A 06/06/2018   Procedure: CORONARY ARTERY BYPASS GRAFTING (CABG) x 3 WITH ENDOSCOPIC HARVESTING OF RIGHT SAPHENOUS VEIN;  Surgeon: Ivin Poot, MD;  Location: [place];  Service: Open Heart Surgery;  Laterality: N/A;  . KNEE ARTHROSCOPY Bilateral   . LEFT HEART CATH AND CORONARY ANGIOGRAPHY N/A 05/30/2018   Procedure: LEFT HEART CATH AND CORONARY ANGIOGRAPHY;  Surgeon: Sherren Mocha, MD;  Location: [place] CV LAB;  Service: Cardiovascular;  Laterality: N/A;  . LEFT HEART CATHETERIZATION WITH CORONARY ANGIOGRAM N/A 04/12/2014   Procedure: LEFT HEART CATHETERIZATION WITH CORONARY ANGIOGRAM;  Surgeon: Blane Ohara, MD;  Location:  [facility] CATH LAB;  Service: Cardiovascular;  Laterality: N/A;  . PERCUTANEOUS CORONARY STENT INTERVENTION (PCI-S)  04/12/2014   Procedure: PERCUTANEOUS CORONARY STENT INTERVENTION (PCI-S);  Surgeon: Blane Ohara, MD;  Location: [facility] CATH LAB;  Service: Cardiovascular;;  . PERCUTANEOUS CORONARY STENT INTERVENTION (PCI-S) N/A 04/14/2014   Procedure: PERCUTANEOUS CORONARY STENT INTERVENTION (PCI-S);  Surgeon: Peter M Martinique, MD;  Location: [facility] CATH LAB;  Service: Cardiovascular;  Laterality: N/A;  . TEE WITHOUT CARDIOVERSION N/A 06/06/2018   Procedure: TRANSESOPHAGEAL ECHOCARDIOGRAM (TEE);  Surgeon: Prescott Gum, Collier Salina, MD;  Location: [place];  Service: Open Heart Surgery;  Laterality: N/A;     Medications: Current Meds  Medication Sig  . amLODipine (NORVASC) 5 MG tablet Take 1 tablet by mouth daily.  Marland Kitchen aspirin EC 81 MG tablet Take 81 mg by mouth daily.  Marland Kitchen atorvastatin (LIPITOR) 40 MG tablet Take 1 tablet (40 mg total) by mouth daily at 6 PM.  . docusate sodium (COLACE) 250 MG capsule Take 250 mg by mouth daily as needed for constipation.   Marland Kitchen HYDROcodone-acetaminophen (NORCO/VICODIN) 5-325 MG per tablet Take 1 tablet by mouth 3 (three) times daily as needed for moderate pain.   . meloxicam (MOBIC) 15 MG tablet Take 15 mg by mouth daily.  . metoprolol succinate (TOPROL-XL) 50 MG 24 hr tablet Take 1 tablet (50 mg total) by mouth daily.  . nitroGLYCERIN (NITROSTAT) 0.4 MG SL tablet Place 1 tablet (0.4 mg total) under the tongue every 5 (five) minutes as needed for chest pain.  . traMADol (ULTRAM) 50 MG tablet Take 2 tablets (100 mg total) by mouth 2 (two) times daily.     Allergies: No Known Allergies  Social History: The patient  reports that he quit smoking about 37 years ago. He has never used smokeless tobacco. He reports that he does not drink alcohol and does not use drugs.   Family History: The patient's family history includes Coronary artery disease (age of onset: 4) in his mother.   Review  of Systems: Please see the history of present illness.   All other systems are reviewed and negative.   Physical Exam: VS:  BP 128/64   Pulse 72   Ht 5\' 11"  (1.803 m)   Wt 275 lb 12.8 oz (125.1 kg)   SpO2 97%   BMI 38.47 kg/m  .  BMI Body mass index is 38.47 kg/m.  Wt Readings from Last 3 Encounters:  06/15/20 275 lb 12.8 oz (125.1 kg)  12/15/19 263 lb (119.3 kg)  11/10/19 264 lb (119.7 kg)    General: Alert and in no acute distress. He has gained 12 pounds since January.  Cardiac: Regular rate and rhythm. No murmurs, rubs, or gallops. No edema.  Respiratory:  Lungs are clear to auscultation bilaterally with normal work of breathing.  GI: Soft and nontender.  MS: No deformity or atrophy. Gait and ROM intact.  Skin: Warm and dry. Color is normal.  Neuro:  Strength and sensation are intact and no gross focal deficits noted.  Psych: Alert, appropriate and with normal affect.   LABORATORY DATA:  EKG:  EKG is ordered today.  Personally reviewed by me. This demonstrates sinus rhythm with 1st degree AV block - HR is 65.  Lab Results  Component Value Date   WBC 4.2 11/10/2019   HGB 13.0 11/10/2019   HCT 38.9 11/10/2019   PLT 202 11/10/2019   GLUCOSE 135 (H) 11/10/2019   CHOL 104 11/10/2019   TRIG 114 11/10/2019   HDL 36 (L) 11/10/2019   LDLCALC 47 11/10/2019   ALT 20 11/10/2019   AST 20 11/10/2019   NA 139 11/10/2019   K 4.2 11/10/2019   CL 105 11/10/2019   CREATININE 0.93 11/10/2019   BUN 12 11/10/2019   CO2 21 11/10/2019   TSH 3.040 11/10/2019   INR 1.21 06/06/2018   HGBA1C 6.0 (H) 06/05/2018     BNP (last 3 results) No results for input(s): BNP in the last 8760 hours.  ProBNP (last 3 results) Recent Labs    11/10/19 1117  PROBNP 172     Other Studies Reviewed Today:  ECHO IMPRESSIONS 10/2019  1. Left ventricular ejection fraction, by visual estimation, is 55 to 60%. The left ventricle has normal function. There is mildly increased left  ventricular hypertrophy. 2. Abnormal septal motion consistent with post-operative status. 3. Left ventricular diastolic parameters are consistent with Grade I diastolic dysfunction (impaired relaxation). 4. The left ventricle has no regional wall motion abnormalities. 5. Global right ventricle has normal systolic function.The right ventricular size is normal. No increase in right ventricular wall thickness. 6. Left atrial size was normal. 7. Right atrial size was normal. 8. Presence of pericardial fat pad. 9. Trivial pericardial effusion is present. 10. Mild mitral annular calcification. 11. The mitral valve is degenerative. Trivial mitral valve regurgitation. No evidence of mitral stenosis. 12. The tricuspid valve is grossly normal. 13. The aortic valve is tricuspid. Aortic valve regurgitation is mild. No evidence of aortic valve sclerosis or stenosis. 14. Pulmonic regurgitation is mild. 15. The pulmonic valve was grossly normal. Pulmonic valve regurgitation is mild. 16. Aortic dilatation noted. 17. 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 18. No significant change from prior study.    LEFT HEART CATH AND CORONARY ANGIOGRAPHY7/2019  Conclusion     The left ventricular systolic function is normal.  LV end diastolic pressure is mildly elevated.  The left ventricular ejection fraction is 55-65% by visual estimate.  Ost LAD lesion is 90% stenosed.  Prox LAD lesion is 50% stenosed.  Mid LAD lesion is 40% stenosed.  2nd Diag lesion is 40% stenosed.  Ost 1st Mrg lesion is 50% stenosed.  Previously placed 1st Mrg stent (unknown type) is widely patent.  Previously placed Prox RCA to Mid RCA stent (unknown type) is widely patent.  Previously placed Mid RCA to Dist RCA stent (unknown type) is widely patent.  Mid RCA lesion is 50% stenosed.  1. Severe ostial LAD stenosis with mild to moderate proximal and mid LAD stenosis 2. Patent left  main with no significant stenosis 3. Patent left circumflex stent (first OM) with moderate  stenosis proximal to the stented segment 4. Patent RCA stents with mild to moderate stenosis just between the stents in the mid RCA 5. Normal LV function  Recommendations: Recommend cardiac surgical consultation for consideration of CABG. Patient has diffuse coronary artery disease and severe stenosis involving the true ostium of the LAD. I think he would benefit from surgical revascularization. He will be instructed to discontinue clopidogrel in anticipation of CABG. Will refer for outpatient surgical consult.    CABG07/10/2018  PROCEDURE PERFORMED: 1. Coronary artery bypass grafting x3 (left internal mammary artery to left anterior descending, saphenous vein graft to diagonal, saphenous vein graft to circumflex marginal). 2. Endoscopic harvest of right leg greater saphenous vein.   ASSESSMENT & PLAN:   1 DOE - somewhat of a chronic issue - I suspect this is from weight gain and deconditioning - echo from December was stable. He does not have chest pain. Will check lab - send for CXR - I think mobility and weight loss is going to be key. See back in 3 months - if symptoms persist despite these measures, will need to consider other testing.   2. CAD - prior NSTEMI with DES x 2 to RCA from 2015 and subsequent CABG x 3 in July of 2019 - no active chest pain but needs aggressive risk factor modification. EKG is ok today.   3. HLD - on statin - lab today. Continue statin therapy.   4. Obesity - see #1.   5. HTN - BP looks good - would stay on current regimen.   6. Remote PAF - this was post op from his CABG - remains in sinus by exam and EKG today.   7. OA - remains on chronic Mobic.   Current medicines are reviewed with the patient today.  The patient does not have concerns regarding medicines other than what has been noted above.  The following changes have been made:  See  above.  Labs/ tests ordered today include:    Orders Placed This Encounter  Procedures  . DG Chest 2 View  . Basic metabolic panel  . CBC  . Hepatic function panel  . Lipid panel  . Pro b natriuretic peptide (BNP)     Disposition:   FU with me in 3 months.    Patient is agreeable to this plan and will call if any problems develop in the interim.   SignedTruitt Merle, NP  06/15/2020 3:22 PM  [facility] [address] [place], [place]  [address] Phone: [phone] Fax: [phone]

## 2020-06-15 ENCOUNTER — Encounter: Payer: Self-pay | Admitting: Nurse Practitioner

## 2020-06-15 ENCOUNTER — Other Ambulatory Visit: Payer: Self-pay

## 2020-06-15 ENCOUNTER — Ambulatory Visit: Payer: Medicare HMO | Admitting: Nurse Practitioner

## 2020-06-15 ENCOUNTER — Ambulatory Visit
Admission: RE | Admit: 2020-06-15 | Discharge: 2020-06-15 | Disposition: A | Discharge status: Home / Self Care | Payer: Medicare HMO | Source: Ambulatory Visit | Attending: Nurse Practitioner | Admitting: Nurse Practitioner

## 2020-06-15 VITALS — BP 128/64 | HR 72 | Ht 71.0 in | Wt 275.8 lb

## 2020-06-15 DIAGNOSIS — I2510 Atherosclerotic heart disease of native coronary artery without angina pectoris: Secondary | ICD-10-CM

## 2020-06-15 DIAGNOSIS — E782 Mixed hyperlipidemia: Secondary | ICD-10-CM

## 2020-06-15 DIAGNOSIS — R0600 Dyspnea, unspecified: Secondary | ICD-10-CM

## 2020-06-15 DIAGNOSIS — I10 Essential (primary) hypertension: Secondary | ICD-10-CM

## 2020-06-15 DIAGNOSIS — R0609 Other forms of dyspnea: Secondary | ICD-10-CM

## 2020-06-15 NOTE — Patient Instructions (Addendum)
After Visit Summary:  We will be checking the following labs today - BMET, CBC, HPF, Lipids and BNP  Please go to [facility] to [place] on the first floor for a chest Xray - you may walk in.    Medication Instructions:    Continue with your current medicines.    If you need a refill on your cardiac medications before your next appointment, please call your pharmacy.     Testing/Procedures To Be Arranged:  N/A  Follow-Up:   See me in about 3 months    At [facility], you and your health needs are our priority.  As part of our continuing mission to provide you with exceptional heart care, we have created designated Provider Care Teams.  These Care Teams include your primary Cardiologist (physician) and Advanced Practice Providers (APPs -  Physician Assistants and Nurse Practitioners) who all work together to provide you with the care you need, when you need it.  Special Instructions:  . Stay safe, wash your hands for at least 20 seconds and wear a mask when needed.  . It was good to talk with you today.  . Think about what we talked about today.  . We will give you a list of PCPs to try . Also try [facility] - Dr. Jacalyn Lefevre - [phone]   Call the [place] office at [phone] if you have any questions, problems or concerns.

## 2020-06-16 ENCOUNTER — Encounter: Payer: Self-pay | Admitting: Orthopaedic Surgery

## 2020-06-16 ENCOUNTER — Ambulatory Visit: Payer: Medicare HMO | Admitting: Orthopaedic Surgery

## 2020-06-16 DIAGNOSIS — M25532 Pain in left wrist: Secondary | ICD-10-CM | POA: Diagnosis not present

## 2020-06-16 LAB — BASIC METABOLIC PANEL
BUN/Creatinine Ratio: 14 (ref 10–24)
BUN: 16 mg/dL (ref 8–27)
CO2: 26 mmol/L (ref 20–29)
Calcium: 9.2 mg/dL (ref 8.6–10.2)
Chloride: 105 mmol/L (ref 96–106)
Creatinine, Ser: 1.18 mg/dL (ref 0.76–1.27)
GFR calc Af Amer: 68 mL/min/{1.73_m2} (ref >59)
GFR calc non Af Amer: 59 mL/min/{1.73_m2} — ABNORMAL LOW (ref >59)
Glucose: 108 mg/dL — ABNORMAL HIGH (ref 65–99)
Potassium: 4.4 mmol/L (ref 3.5–5.2)
Sodium: 143 mmol/L (ref 134–144)

## 2020-06-16 LAB — HEPATIC FUNCTION PANEL
ALT: 19 IU/L (ref 0–44)
AST: 20 IU/L (ref 0–40)
Albumin: 4.2 g/dL (ref 3.7–4.7)
Alkaline Phosphatase: 100 IU/L (ref 48–121)
Bilirubin Total: 0.7 mg/dL (ref 0.0–1.2)
Bilirubin, Direct: 0.19 mg/dL (ref 0.00–0.40)
Total Protein: 6.9 g/dL (ref 6.0–8.5)

## 2020-06-16 LAB — CBC
Hematocrit: 41.2 % (ref 37.5–51.0)
Hemoglobin: 14.2 g/dL (ref 13.0–17.7)
MCH: 32.8 pg (ref 26.6–33.0)
MCHC: 34.5 g/dL (ref 31.5–35.7)
MCV: 95 fL (ref 79–97)
Platelets: 150 10*3/uL (ref 150–450)
RBC: 4.33 x10E6/uL (ref 4.14–5.80)
RDW: 12.6 % (ref 11.6–15.4)
WBC: 5.1 10*3/uL (ref 3.4–10.8)

## 2020-06-16 LAB — LIPID PANEL
Chol/HDL Ratio: 3.1 ratio (ref 0.0–5.0)
Cholesterol, Total: 140 mg/dL (ref 100–199)
HDL: 45 mg/dL (ref >39)
LDL Chol Calc (NIH): 68 mg/dL (ref 0–99)
Triglycerides: 156 mg/dL — ABNORMAL HIGH (ref 0–149)
VLDL Cholesterol Cal: 27 mg/dL (ref 5–40)

## 2020-06-16 LAB — PRO B NATRIURETIC PEPTIDE: NT-Pro BNP: 192 pg/mL (ref 0–486)

## 2020-06-17 ENCOUNTER — Telehealth: Payer: Self-pay | Admitting: Nurse Practitioner

## 2020-06-17 NOTE — Telephone Encounter (Signed)
Informed patient of results and verbal understanding expressed.  

## 2020-06-17 NOTE — Telephone Encounter (Signed)
Patient called back returning a call about CT results

## 2020-06-19 NOTE — Progress Notes (Signed)
Office Visit Note   Patient: Allen James           Date of Birth: 01-05-1941           MRN: [id number] Visit Date: 06/16/2020              Requested by: Allen Shan, James [address] [place],  [place] [address] PCP: Allen Shan, James   Assessment & Plan: Visit Diagnoses:  1. Left wrist pain     Plan: Impression is left wrist posttraumatic arthritis.  He did get a good response from the cortisone injection.  I will reach out to Dr.Newton to see if PIN ablation is a possibility.  Follow-Up Instructions: Return if symptoms worsen or fail to improve.   Orders:  Orders Placed This Encounter  Procedures  . Ambulatory referral to Physical Medicine Rehab   No orders of the defined types were placed in this encounter.     Procedures: No procedures performed   Clinical Data: No additional findings.   Subjective: Chief Complaint  Patient presents with  . Left Hand - Pain    Allen James returns today for follow-up of his left wrist pain.  The injection he got 6 weeks ago did help about 90%.  He states that he has some pain with certain movements of the wrist.  He is wondering if nerve ablation would be a possibility.   Review of Systems   Objective: Vital Signs: There were no vitals taken for this visit.  Physical Exam  Ortho Exam Left wrist exam shows less pain with range of motion.  He mainly has discomfort with extremes of range of motion.  No swelling. Specialty Comments:  No specialty comments available.  Imaging: No results found.   PMFS History: Patient Active Problem List   Diagnosis Date Noted  . 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 Coronary artery disease 06/06/2018  . Benign essential hypertension 12/20/2016  . Dyspepsia 03/07/2016  . Primary insomnia 03/07/2016  . Obstructive apnea 12/28/2014  . Pure hypercholesterolemia 12/28/2014  . Osteoarthrosis 09/30/2014  . Chronic lower back pain 07/13/2014  . Muscle soreness 04/27/2014  . CAD  (coronary artery disease) 04/14/2014  . AKI (acute kidney injury) ([place]) 04/14/2014  . Obesity 04/14/2014  . Adiposity 04/14/2014  . Coronary artery disease involving native coronary artery with angina pectoris ([place]) 04/14/2014  . Injury of kidney 04/14/2014  . OSA on CPAP   . Hyperlipidemia   . NSTEMI (non-ST elevated myocardial infarction) ([place]) 04/12/2014  . Acute non-ST segment elevation myocardial infarction ([place]) 04/12/2014  . Acute non-ST elevation myocardial infarction (NSTEMI) ([place]) 04/12/2014  . HOARSENESS 10/18/2009  . SLEEP APNEA 02/24/2009   Past Medical History:  Diagnosis Date  . Abdominal hernia    pt. states he had it for 10 yrs or longer  . Abnormal findings on cardiac catheterization    ! Do NOT use RIGHT radial access on future caths or PCI.  Marland Kitchen Acute non-ST elevation myocardial infarction (NSTEMI) ([place]) 04/12/2014  . Acute non-ST segment elevation myocardial infarction ([place]) 04/12/2014  . Adiposity 04/14/2014  . AKI (acute kidney injury) ([place]) 04/14/2014  . Anginal pain ([place])   . Arthritis   . Back pain   . Benign essential hypertension 12/20/2016  . CAD (coronary artery disease)    a. NSTEMI 04/12/14 s/p DES x 2 to RCA. Prox 3.5x38 mm Xience Alpine DES, Dist 3.5x28 mm Xience DES; EF 65-70%. Staged PCI to the OM1 with  2.75 x 12 mm Resolute DES     . Chronic coronary artery disease 04/14/2014  . Chronic lower back pain 07/13/2014  . Dyspepsia 03/07/2016  . Dyspnea   . GERD (gastroesophageal reflux disease)   . H/O hiatal hernia   . Hyperlipidemia   . Injury of kidney 04/14/2014  . Muscle soreness 04/27/2014  . NSTEMI (non-ST elevated myocardial infarction) ([place]) 04/12/2014  . Obesity 04/14/2014  . Obstructive apnea 12/28/2014  . OSA on CPAP   . Osteoarthrosis 09/30/2014  . Primary insomnia 03/07/2016  . Pure hypercholesterolemia 12/28/2014  . SLEEP APNEA 02/24/2009   Qualifier: Diagnosis of  By: Allen James     Family History  Problem Relation Age of Onset  .  Coronary artery disease Mother 8    Past Surgical History:  Procedure Laterality Date  . APPENDECTOMY     Remote  . CARPAL TUNNEL RELEASE Bilateral   . CORONARY ANGIOPLASTY  04/12/14   STENT TO RCA  . CORONARY ARTERY BYPASS GRAFT N/A 06/06/2018   Procedure: CORONARY ARTERY BYPASS GRAFTING (CABG) x 3 WITH ENDOSCOPIC HARVESTING OF RIGHT SAPHENOUS VEIN;  Surgeon: Allen Poot, James;  Location: [place];  Service: Open Heart Surgery;  Laterality: N/A;  . KNEE ARTHROSCOPY Bilateral   . LEFT HEART CATH AND CORONARY ANGIOGRAPHY N/A 05/30/2018   Procedure: LEFT HEART CATH AND CORONARY ANGIOGRAPHY;  Surgeon: Allen Mocha, James;  Location: [place] CV LAB;  Service: Cardiovascular;  Laterality: N/A;  . LEFT HEART CATHETERIZATION WITH CORONARY ANGIOGRAM N/A 04/12/2014   Procedure: LEFT HEART CATHETERIZATION WITH CORONARY ANGIOGRAM;  Surgeon: Allen Ohara, James;  Location: [facility] CATH LAB;  Service: Cardiovascular;  Laterality: N/A;  . PERCUTANEOUS CORONARY STENT INTERVENTION (PCI-S)  04/12/2014   Procedure: PERCUTANEOUS CORONARY STENT INTERVENTION (PCI-S);  Surgeon: Allen Ohara, James;  Location: [facility] CATH LAB;  Service: Cardiovascular;;  . PERCUTANEOUS CORONARY STENT INTERVENTION (PCI-S) N/A 04/14/2014   Procedure: PERCUTANEOUS CORONARY STENT INTERVENTION (PCI-S);  Surgeon: Allen M Martinique, James;  Location: [facility] CATH LAB;  Service: Cardiovascular;  Laterality: N/A;  . TEE WITHOUT CARDIOVERSION N/A 06/06/2018   Procedure: TRANSESOPHAGEAL ECHOCARDIOGRAM (TEE);  Surgeon: Allen James;  Location: [place];  Service: Open Heart Surgery;  Laterality: N/A;   Social History   Occupational History  . Not on file  Tobacco Use  . Smoking status: Former Smoker    Quit date: 11/26/1982    Years since quitting: 37.5  . Smokeless tobacco: Never Used  Substance and Sexual Activity  . Alcohol use: No  . Drug use: No  . Sexual activity: Not on file

## 2020-06-20 ENCOUNTER — Other Ambulatory Visit: Payer: Self-pay | Admitting: Radiology

## 2020-06-20 DIAGNOSIS — M25532 Pain in left wrist: Secondary | ICD-10-CM

## 2020-06-20 NOTE — Addendum Note (Signed)
Addended by: Lanna Poche R on: 06/20/2020 04:39 PM   Modules accepted: Orders

## 2020-08-08 ENCOUNTER — Ambulatory Visit
Admission: RE | Admit: 2020-08-08 | Discharge: 2020-08-08 | Disposition: A | Discharge status: Home / Self Care | Payer: Medicare HMO | Source: Ambulatory Visit | Attending: Family Medicine | Admitting: Family Medicine

## 2020-08-08 ENCOUNTER — Other Ambulatory Visit: Payer: Self-pay | Admitting: Family Medicine

## 2020-08-08 DIAGNOSIS — R0609 Other forms of dyspnea: Secondary | ICD-10-CM

## 2020-08-08 DIAGNOSIS — R0600 Dyspnea, unspecified: Secondary | ICD-10-CM

## 2020-08-28 NOTE — Progress Notes (Deleted)
CARDIOLOGY OFFICE NOTE  Date:  08/28/2020    Allen James Date of Birth: 12/05/1940 Medical Record #[id number]  PCP:  Delilah Shan, MD  Cardiologist:  Servando Snare & ***    No chief complaint on file.   History of Present Illness: Allen James is a 79 y.o. male who presents today for a ***  Seen for Dr. Burt Knack.   He has a known history of HLD and known CAD with prior NSTEMI with DES to RCA x 2 in 2015.In June of 2019 he had progressive angina - was cathed and referred for CABG x 3 per PVT in July of 2019 [place] to LAD, SVG to Diagonal, and SVG to OM and endoscopic harvest of greater saphenous vein from his right leg. Post op course was noted for PAF, AKI and volume overload.  Dr. Burt Knack and I have followed him since - we have stopped amiodarone and Lasix. When seen in December 2020 - noted more swelling - using more salt - we got his echo updated- EF is ok - he does have some diastolic dysfunction. He was worried aboutovertCHF.Last seen in January of 2021 and he was doing well. Trying to be more mindful of his sodium intake. Was having some GI issues and requested referral back to [facility].   Comes in today. Here alone. He feels like he is doing ok. Noting more DOE again - having to stop and rest. His weight is up 12 pounds since last visit. He says he is not using more salt. But he is eating more.  He has gotten pretty sedentary. Worse with the hot weather. His stationary bike is up in his attic. He basically just walks to the mailbox and that is about it. No actual chest pain - he did have chest pain with all his cardiac events. This has not recurred. BP is good. Needing new PCP.   Comes in today. Here with   Past Medical History:  Diagnosis Date  . Abdominal hernia    pt. states he had it for 10 yrs or longer  . Abnormal findings on cardiac catheterization    ! Do NOT use RIGHT radial access on future caths or PCI.  Marland Kitchen Acute non-ST elevation myocardial  infarction (NSTEMI) ([place]) 04/12/2014  . Acute non-ST segment elevation myocardial infarction ([place]) 04/12/2014  . Adiposity 04/14/2014  . AKI (acute kidney injury) ([place]) 04/14/2014  . Anginal pain ([place])   . Arthritis   . Back pain   . Benign essential hypertension 12/20/2016  . CAD (coronary artery disease)    a. NSTEMI 04/12/14 s/p DES x 2 to RCA. Prox 3.5x38 mm Xience Alpine DES, Dist 3.5x28 mm Xience DES; EF 65-70%. Staged PCI to the OM1 with 2.75 x 12 mm Resolute DES     . Chronic coronary artery disease 04/14/2014  . Chronic lower back pain 07/13/2014  . Dyspepsia 03/07/2016  . Dyspnea   . GERD (gastroesophageal reflux disease)   . H/O hiatal hernia   . Hyperlipidemia   . Injury of kidney 04/14/2014  . Muscle soreness 04/27/2014  . NSTEMI (non-ST elevated myocardial infarction) ([place]) 04/12/2014  . Obesity 04/14/2014  . Obstructive apnea 12/28/2014  . OSA on CPAP   . Osteoarthrosis 09/30/2014  . Primary insomnia 03/07/2016  . Pure hypercholesterolemia 12/28/2014  . SLEEP APNEA 02/24/2009   Qualifier: Diagnosis of  By: Annamaria Boots MD, Kasandra Knudsen     Past Surgical History:  Procedure Laterality Date  . APPENDECTOMY  Remote  . CARPAL TUNNEL RELEASE Bilateral   . CORONARY ANGIOPLASTY  04/12/14   STENT TO RCA  . CORONARY ARTERY BYPASS GRAFT N/A 06/06/2018   Procedure: CORONARY ARTERY BYPASS GRAFTING (CABG) x 3 WITH ENDOSCOPIC HARVESTING OF RIGHT SAPHENOUS VEIN;  Surgeon: Ivin Poot, MD;  Location: [place];  Service: Open Heart Surgery;  Laterality: N/A;  . KNEE ARTHROSCOPY Bilateral   . LEFT HEART CATH AND CORONARY ANGIOGRAPHY N/A 05/30/2018   Procedure: LEFT HEART CATH AND CORONARY ANGIOGRAPHY;  Surgeon: Sherren Mocha, MD;  Location: [place] CV LAB;  Service: Cardiovascular;  Laterality: N/A;  . LEFT HEART CATHETERIZATION WITH CORONARY ANGIOGRAM N/A 04/12/2014   Procedure: LEFT HEART CATHETERIZATION WITH CORONARY ANGIOGRAM;  Surgeon: Blane Ohara, MD;  Location[facility] C[facility] LAB;  Service:  Cardiovascular;  Laterality: N/A;  . PERCUTANEOUS CORONARY STENT INTERVENTION (PCI-S)  04/12/2014   Procedure: PERCUTANEOUS CORONARY STENT INTERVENTION (PCI-S);  Surgeon: Blane Ohara, MD;  Location: [facility] CATH LAB;  Service: Cardiovascular;;  . PERCUTANEOUS CORONARY STENT INTERVENTION (PCI-S) N/A 04/14/2014   Procedure: PERCUTANEOUS CORONARY STENT INTERVENTION (PCI-S);  Surgeon: Peter M Martinique, MD;  Location: [facility] CATH LAB;  Service: Cardiovascular;  Laterality: N/A;  . TEE WITHOUT CARDIOVERSION N/A 06/06/2018   Procedure: TRANSESOPHAGEAL ECHOCARDIOGRAM (TEE);  Surgeon: Prescott Gum, Collier Salina, MD;  Location: [place];  Service: Open Heart Surgery;  Laterality: N/A;     Medications: No outpatient medications have been marked as taking for the 09/06/20 encounter (Appointment) with Burtis Junes, NP.     Allergies: No Known Allergies  Social History: The patient  reports that he quit smoking about 37 years ago. He has never used smokeless tobacco. He reports that he does not drink alcohol and does not use drugs.   Family History: The patient's ***family history includes Coronary artery disease (age of onset: 44) in his mother.   Review of Systems: Please see the history of present illness.   All other systems are reviewed and negative.   Physical Exam: VS:  There were no vitals taken for this visit. Marland Kitchen  BMI There is no height or weight on file to calculate BMI.  Wt Readings from Last 3 Encounters:  06/15/20 275 lb 12.8 oz (125.1 kg)  12/15/19 263 lb (119.3 kg)  11/10/19 264 lb (119.7 kg)    General: Pleasant. Well developed, well nourished and in no acute distress.   HEENT: Normal.  Neck: Supple, no JVD, carotid bruits, or masses noted.  Cardiac: ***Regular rate and rhythm. No murmurs, rubs, or gallops. No edema.  Respiratory:  Lungs are clear to auscultation bilaterally with normal work of breathing.  GI: Soft and nontender.  MS: No deformity or atrophy. Gait and ROM intact.  Skin:  Warm and dry. Color is normal.  Neuro:  Strength and sensation are intact and no gross focal deficits noted.  Psych: Alert, appropriate and with normal affect.   LABORATORY DATA:  EKG:  EKG {ACTION; IS/IS [id number]} ordered today.  Personally reviewed by me. This demonstrates ***.  Lab Results  Component Value Date   WBC 5.1 06/15/2020   HGB 14.2 06/15/2020   HCT 41.2 06/15/2020   PLT 150 06/15/2020   GLUCOSE 108 (H) 06/15/2020   CHOL 140 06/15/2020   TRIG 156 (H) 06/15/2020   HDL 45 06/15/2020   LDLCALC 68 06/15/2020   ALT 19 06/15/2020   AST 20 06/15/2020   NA 143 06/15/2020   K 4.4 06/15/2020   CL 105 06/15/2020  CREATININE 1.18 06/15/2020   BUN 16 06/15/2020   CO2 26 06/15/2020   TSH 3.040 11/10/2019   INR 1.21 06/06/2018   HGBA1C 6.0 (H) 06/05/2018     BNP (last 3 results) No results for input(s): BNP in the last 8760 hours.  ProBNP (last 3 results) Recent Labs    11/10/19 1117 06/15/20 1529  PROBNP 172 192     Other Studies Reviewed Today:   Assessment/Plan: ECHOIMPRESSIONS12/2020  1. Left ventricular ejection fraction, by visual estimation, is 55 to 60%. The left ventricle has normal function. There is mildly increased left ventricular hypertrophy. 2. Abnormal septal motion consistent with post-operative status. 3. Left ventricular diastolic parameters are consistent with Grade I diastolic dysfunction (impaired relaxation). 4. The left ventricle has no regional wall motion abnormalities. 5. Global right ventricle has normal systolic function.The right ventricular size is normal. No increase in right ventricular wall thickness. 6. Left atrial size was normal. 7. Right atrial size was normal. 8. Presence of pericardial fat pad. 9. Trivial pericardial effusion is present. 10. Mild mitral annular calcification. 11. The mitral valve is degenerative. Trivial mitral valve regurgitation. No evidence of mitral stenosis. 12. The tricuspid  valve is grossly normal. 13. The aortic valve is tricuspid. Aortic valve regurgitation is mild. No evidence of aortic valve sclerosis or stenosis. 14. Pulmonic regurgitation is mild. 15. The pulmonic valve was grossly normal. Pulmonic valve regurgitation is mild. 16. Aortic dilatation noted. 17. 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 18. No significant change from prior study.    LEFT HEART CATH AND CORONARY ANGIOGRAPHY7/2019  Conclusion     The left ventricular systolic function is normal.  LV end diastolic pressure is mildly elevated.  The left ventricular ejection fraction is 55-65% by visual estimate.  Ost LAD lesion is 90% stenosed.  Prox LAD lesion is 50% stenosed.  Mid LAD lesion is 40% stenosed.  2nd Diag lesion is 40% stenosed.  Ost 1st Mrg lesion is 50% stenosed.  Previously placed 1st Mrg stent (unknown type) is widely patent.  Previously placed Prox RCA to Mid RCA stent (unknown type) is widely patent.  Previously placed Mid RCA to Dist RCA stent (unknown type) is widely patent.  Mid RCA lesion is 50% stenosed.  1. Severe ostial LAD stenosis with mild to moderate proximal and mid LAD stenosis 2. Patent left main with no significant stenosis 3. Patent left circumflex stent (first OM) with moderate stenosis proximal to the stented segment 4. Patent RCA stents with mild to moderate stenosis just between the stents in the mid RCA 5. Normal LV function  Recommendations: Recommend cardiac surgical consultation for consideration of CABG. Patient has diffuse coronary artery disease and severe stenosis involving the true ostium of the LAD. I think he would benefit from surgical revascularization. He will be instructed to discontinue clopidogrel in anticipation of CABG. Will refer for outpatient surgical consult.    CABG07/10/2018  PROCEDURE PERFORMED: 1. Coronary artery bypass grafting x3 (left internal mammary artery  to left anterior descending, saphenous vein graft to diagonal, saphenous vein graft to circumflex marginal). 2. Endoscopic harvest of right leg greater saphenous vein.   ASSESSMENT & PLAN:   1 DOE - somewhat of a chronic issue - I suspect this is from weight gain and deconditioning - echo from December was stable. He does not have chest pain. Will check lab - send for CXR - I think mobility and weight loss is going to be key. See back in  3 months - if symptoms persist despite these measures, will need to consider other testing.   2. CAD - prior NSTEMI with DES x 2 to RCA from 2015 and subsequent CABG x 3 in July of 2019 - no active chest pain but needs aggressive risk factor modification. EKG is ok today.   3. HLD - on statin - lab today. Continue statin therapy.   4. Obesity - see #1.   5. HTN - BP looks good - would stay on current regimen.   6. Remote PAF - this was post op from his CABG - remains in sinus by exam and EKG today.   7. OA - remains on chronic Mobic.     Current medicines are reviewed with the patient today.  The patient does not have concerns regarding medicines other than what has been noted above.  The following changes have been made:  See above.  Labs/ tests ordered today include:   No orders of the defined types were placed in this encounter.    Disposition:   FU with *** in {gen number [id number]} {Days to years:10300}.   Patient is agreeable to this plan and will call if any problems develop in the interim.   SignedTruitt Merle, NP  08/28/2020 8:41 PM  [facility] [address] [place], [place]  [address] Phone: [phone] Fax: [phone]

## 2020-09-06 ENCOUNTER — Ambulatory Visit: Payer: Medicare HMO | Admitting: Nurse Practitioner

## 2020-09-14 ENCOUNTER — Encounter: Payer: Self-pay | Admitting: Internal Medicine

## 2020-09-14 ENCOUNTER — Ambulatory Visit: Payer: Medicare HMO | Admitting: Internal Medicine

## 2020-09-14 ENCOUNTER — Other Ambulatory Visit: Payer: Self-pay

## 2020-09-14 VITALS — BP 130/60 | HR 81 | Temp 97.2°F | Ht 70.0 in | Wt 272.4 lb

## 2020-09-14 DIAGNOSIS — R0602 Shortness of breath: Secondary | ICD-10-CM

## 2020-09-14 MED ORDER — ALBUTEROL SULFATE HFA 108 (90 BASE) MCG/ACT IN AERS: 2.0000 | INHALATION_SPRAY | Freq: Four times a day (QID) | RESPIRATORY_TRACT | 5 refills | Status: DC | PRN

## 2020-09-14 NOTE — Patient Instructions (Signed)
The patient should have follow up scheduled with myself in 2 months.    Take the albuterol rescue inhaler every 4 to 6 hours as needed for wheezing or shortness of breath. You can also take it 15 minutes before exercise or exertional activity. Side effects include heart racing or pounding, jitters or anxiety. If you have a history of an irregular heart rhythm, it can make this worse. Can also give some patients a hard time sleeping.  To inhale the aerosol using an inhaler, follow these steps:  1. Remove the protective dust cap from the end of the mouthpiece. If the dust cap was not placed on the mouthpiece, check the mouthpiece for dirt or other objects. Be sure that the canister is fully and firmly inserted in the mouthpiece. 2. If you are using the inhaler for the first time or if you have not used the inhaler in more than 14 days, you will need to prime it. You may also need to prime the inhaler if it has been dropped. Ask your pharmacist or check the manufacturer's information if this happens. To prime the inhaler, shake it well and then press down on the canister 4 times to release 4 sprays into the air, away from your face. Be careful not to get albuterol in your eyes. 3. Shake the inhaler well. 4. Breathe out as completely as possible through your mouth. 4. Hold the canister with the mouthpiece on the bottom, facing you and the canister pointing upward. Place the open end of the mouthpiece into your mouth. Close your lips tightly around the mouthpiece. 6. Breathe in slowly and deeply through the mouthpiece.At the same time, press down once on the container to spray the medication into your mouth. 7. Try to hold your breath for 10 seconds. remove the inhaler, and breathe out slowly. 8. If you were told to use 2 puffs, wait 1 minute and then repeat steps 3-7. 9. Replace the protective cap on the inhaler. 10. Clean your inhaler regularly. Follow the manufacturer's directions carefully and ask  your doctor or pharmacist if you have any questions about cleaning your inhaler.  Check the back of the inhaler to keep track of the total number of doses left on the inhaler.

## 2020-09-14 NOTE — Progress Notes (Signed)
Allen James    [id number]    10/05/1941  Primary Care Physician:Kelly, Delaine Lame, MD  Referring Physician: Delilah Shan, MD No address on file Reason for Consultation: shortness of breath Date of Consultation: 09/14/2020  Chief complaint:   Chief Complaint  Patient presents with  . Consult    sob with activity for 4 months.  exposed to asbestos and benzine with his past work.     HPI: Allen James is a 79 y.o. gentleman with history of CAD s/p CABG in June 2019. Here for new patient evaluation of dyspnea on exertion over the last couple of years. Been about the same since then.  Working in his garden, playing golf make him short of breath now. Any walking. Worse with cold weather - he has to rest after getting mail from [place]. He recovers with rest. No chest pain, no cough or wheezing. He also has back pain with activity. He is minimally active at home now. Independent with ADLs. He is able to run a vacuum cleaner.   Never been prescribed inhalers for his breathing. Never had pneumonia or bronchitis. This feels different from his heart issues - no palpitations or chest pain.  Sleep is good. No apneas, gets up once a night to go to the bathroom. Had a sleep study 10-15 years ago and was given a cpap. Used it for 7-8 years, self discontinued after he forgot machine part on vacation. Felt like he was sleeping fine without it. No change with bending forward.     Intentionally trying to lose weight. Maybe 20 lbs over the last year. 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  Smoking history: 20 pack years, quit 1980.   Social History   Occupational History  . Not on file  Tobacco Use  . Smoking status: Former Smoker    Packs/day: 1.00    Years: 20.00    Pack years: 20.00    Types: Cigarettes    Quit date: 11/26/1978    Years since quitting:  41.8  . Smokeless tobacco: Never Used  Substance and Sexual Activity  . Alcohol use: No  . Drug use: No  . Sexual activity: Not on file    Relevant family history:  Family History  Problem Relation Age of Onset  . Coronary artery disease Mother 53  . Emphysema Father        Ecologist    Past Medical History:  Diagnosis Date  . Abdominal hernia    pt. states he had it for 10 yrs or longer  . Abnormal findings on cardiac catheterization    ! Do NOT use RIGHT radial access on future caths or PCI.  Marland Kitchen Acute non-ST elevation myocardial infarction (NSTEMI) ([place]) 04/12/2014  . Acute non-ST segment elevation myocardial infarction ([place]) 04/12/2014  . Adiposity 04/14/2014  . AKI (acute kidney injury) ([place]) 04/14/2014  . Anginal pain ([place])   . Arthritis   . Back pain   . Benign essential hypertension 12/20/2016  . CAD (coronary artery disease)    a. NSTEMI 04/12/14 s/p DES x 2 to RCA. Prox 3.5x38 mm Xience Alpine DES, Dist 3.5x28 mm Xience DES; EF 65-70%. Staged PCI to the OM1 with 2.75 x 12 mm Resolute DES     . Chronic coronary artery disease 04/14/2014  . Chronic lower back pain 07/13/2014  .  Dyspepsia 03/07/2016  . Dyspnea   . GERD (gastroesophageal reflux disease)   . H/O hiatal hernia   . Hyperlipidemia   . Injury of kidney 04/14/2014  . Muscle soreness 04/27/2014  . NSTEMI (non-ST elevated myocardial infarction) ([place]) 04/12/2014  . Obesity 04/14/2014  . Obstructive apnea 12/28/2014  . OSA on CPAP   . Osteoarthrosis 09/30/2014  . Primary insomnia 03/07/2016  . Pure hypercholesterolemia 12/28/2014  . SLEEP APNEA 02/24/2009   Qualifier: Diagnosis of  By: Annamaria Boots MD, Kasandra Knudsen     Past Surgical History:  Procedure Laterality Date  . APPENDECTOMY     Remote  . CARPAL TUNNEL RELEASE Bilateral   . CORONARY ANGIOPLASTY  04/12/14   STENT TO RCA  . CORONARY ARTERY BYPASS GRAFT N/A 06/06/2018   Procedure: CORONARY ARTERY BYPASS GRAFTING (CABG) x 3 WITH ENDOSCOPIC HARVESTING OF RIGHT SAPHENOUS  VEIN;  Surgeon: Ivin Poot, MD;  Location: [place];  Service: Open Heart Surgery;  Laterality: N/A;  . KNEE ARTHROSCOPY Bilateral   . LEFT HEART CATH AND CORONARY ANGIOGRAPHY N/A 05/30/2018   Procedure: LEFT HEART CATH AND CORONARY ANGIOGRAPHY;  Surgeon: Sherren Mocha, MD;  Location: [place] CV LAB;  Service: Cardiovascular;  Laterality: N/A;  . LEFT HEART CATHETERIZATION WITH CORONARY ANGIOGRAM N/A 04/12/2014   Procedure: LEFT HEART CATHETERIZATION WITH CORONARY ANGIOGRAM;  Surgeon: Blane Ohara, MD;  Location: [facility] CATH LAB;  Service: Cardiovascular;  Laterality: N/A;  . PERCUTANEOUS CORONARY STENT INTERVENTION (PCI-S)  04/12/2014   Procedure: PERCUTANEOUS CORONARY STENT INTERVENTION (PCI-S);  Surgeon: Blane Ohara, MD;  Location: [facility] CATH LAB;  Service: Cardiovascular;;  . PERCUTANEOUS CORONARY STENT INTERVENTION (PCI-S) N/A 04/14/2014   Procedure: PERCUTANEOUS CORONARY STENT INTERVENTION (PCI-S);  Surgeon: Peter M Martinique, MD;  Location: [facility] CATH LAB;  Service: Cardiovascular;  Laterality: N/A;  . TEE WITHOUT CARDIOVERSION N/A 06/06/2018   Procedure: TRANSESOPHAGEAL ECHOCARDIOGRAM (TEE);  Surgeon: Prescott Gum, Collier Salina, MD;  Location: [place];  Service: Open Heart Surgery;  Laterality: N/A;     Physical Exam: Blood pressure 130/60, pulse 81, temperature (!) 97.2 F (36.2 C), temperature source Temporal, height 5\' 10"  (1.778 m), weight 272 lb 6.4 oz (123.6 kg), SpO2 96 %. Gen:      No acute distress ENT:   no nasal polyps, mucus membranes moist Lungs:    No increased respiratory effort, symmetric chest wall excursion, clear to auscultation bilaterally, no wheezes or crackles CV:         Regular rate and rhythm; no murmurs, rubs, or gallops.  1+ pedal edema Abd:     Obese,  + bowel sounds; soft, non-tender; no distension MSK: no acute synovitis of DIP or PIP joints, no mechanics hands.  Skin:      Warm and dry;  Neuro: normal speech, no focal facial asymmetry Psych: alert and oriented x3,  normal mood and affect   Data Reviewed/Medical Decision Making:  Independent interpretation of tests: Imaging: . Review of patient's chest xray 2021 september images revealed cardiomegaly The patient's images have been independently reviewed by me.    PFTs: I have personally reviewed the patient's PFTs in 2019 which show mild restriction to ventilation most likely secondary to body habitus. Normal spirometry and no significant response to BD.  PFT Results Latest Ref Rng & Units 06/05/2018  FVC-Pre L 3.51  FVC-Predicted Pre % 80  FVC-Post L 3.36  FVC-Predicted Post % 77  Pre FEV1/FVC % % 80  Post FEV1/FCV % % 80  FEV1-Pre L 2.82  FEV1-Predicted Pre % 89  FEV1-Post L 2.68  DLCO uncorrected ml/min/mmHg 22.20  DLCO UNC% % 65  DLCO corrected ml/min/mmHg 22.59  DLCO COR %Predicted % 67  DLVA Predicted % 94  TLC L 3.91  TLC % Predicted % 53  RV % Predicted % 13   Echocardiogram Dec 2020   1. Left ventricular ejection fraction, by visual estimation, is 55 to  60%. The left ventricle has normal function. There is mildly increased  left ventricular hypertrophy.  2. Abnormal septal motion consistent with post-operative status.  3. Left ventricular diastolic parameters are consistent with Grade I  diastolic dysfunction (impaired relaxation).  4. The left ventricle has no regional wall motion abnormalities.  5. Global right ventricle has normal systolic function.The right  ventricular size is normal. No increase in right ventricular wall  thickness.  6. Left atrial size was normal.  7. Right atrial size was normal.  8. Presence of pericardial fat pad.  9. Trivial pericardial effusion is present.  10. Mild mitral annular calcification.  11. The mitral valve is degenerative. Trivial mitral valve regurgitation.  No evidence of mitral stenosis.  12. The tricuspid valve is grossly normal.  13. The aortic valve is tricuspid. Aortic valve regurgitation is mild. No  evidence of  aortic valve sclerosis or stenosis.  14. Pulmonic regurgitation is mild.  15. The pulmonic valve was grossly normal. Pulmonic valve regurgitation is  mild.  16. Aortic dilatation noted.  17. 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  18. No significant change from prior study.    Labs:  Lab Results  Component Value Date   WBC 5.1 06/15/2020   HGB 14.2 06/15/2020   HCT 41.2 06/15/2020   MCV 95 06/15/2020   PLT 150 06/15/2020   Lab Results  Component Value Date   NA 143 06/15/2020   K 4.4 06/15/2020   CL 105 06/15/2020   CO2 26 06/15/2020     Immunization status:  Immunization History  Administered Date(s) Administered  . Fluad Quad(high Dose 65+) 08/24/2020  . Influenza, High Dose Seasonal PF 10/04/2015  . Influenza-Unspecified 09/01/2014, 07/31/2016  . PFIZER SARS-COV-2 Vaccination 02/04/2020, 03/01/2020  . Pneumococcal Conjugate-13 06/26/2016  . Pneumococcal Polysaccharide-23 04/13/2014, 04/01/2015  . Zoster 07/17/2016  . Zoster Recombinat (Shingrix) 04/24/2017    . I reviewed prior external note(s) from cardiology . I reviewed the result(s) of the labs and imaging as noted above.  . I have ordered medications   Assessment:  Shortness of breath Coronary disease status post CABG History of tobacco use, quit 1980, 20 pack years History of sleep apnea, not on CPAP  Plan/Recommendations:  Mr. Alford Highland is a very pleasant 79 year old gentleman with a history of sleep apnea, coronary disease, tobacco use presents shortness of breath.  His cardiology work-up has been negative for ischemia being responsible for symptoms.  His chest x-ray is fairly clear and unremarkable for someone who has had a history of CABG and heart disease.  I reviewed his PFTs in 2019 and there is no evidence of interstitial lung disease on his exam.  Although he does not have any airflow limitation on spirometry, albuterol could still be helpful given his history of tobacco  use.  We discussed a trial of albuterol as needed for shortness of breath to see if that helps his symptoms.  He is off he discontinued his CPAP several years ago.  He mentions fatigue is one of his symptoms in addition to dyspnea.  Would  be reasonable to repeat a CPAP titration study as the next step.  We discussed disease management and progression at length today.   Return to Care: Return in about 2 months (around 11/14/2020).  Lenice Llamas, MD Pulmonary and [place]  CC: Delilah Shan, MD

## 2020-11-15 ENCOUNTER — Ambulatory Visit: Payer: Medicare HMO | Admitting: Internal Medicine

## 2020-12-05 ENCOUNTER — Other Ambulatory Visit: Payer: Self-pay

## 2020-12-05 ENCOUNTER — Encounter: Payer: Self-pay | Admitting: Internal Medicine

## 2020-12-05 ENCOUNTER — Ambulatory Visit (INDEPENDENT_AMBULATORY_CARE_PROVIDER_SITE_OTHER): Payer: Medicare HMO | Admitting: Internal Medicine

## 2020-12-05 VITALS — BP 130/74 | HR 65 | Temp 97.3°F | Ht 71.0 in | Wt 272.0 lb

## 2020-12-05 DIAGNOSIS — R0602 Shortness of breath: Secondary | ICD-10-CM

## 2020-12-05 NOTE — Progress Notes (Signed)
Allen James    [id number]    [birth year]  Primary Care Physician:Kelly, Delaine Lame, MD Date of Appointment: 12/05/2020 Established Patient Visit  Chief complaint:  Shortness of breath  HPI: Allen James is a 80 y.o. gentleman with history of tobacco use disorder and shortness of breath. CABG 2019.   Interval Updates: Here for follow up today for breathing after a trial of albuterol. He couldn't tell any difference or improvement in his breathing. Stopped taking it. Feels that cooler weather is helping his breathing more than anything. Fatigue is still there but better.   Goes to bed at 10pm, wakes up at 6am. He feels rested and refreshed in the morning. He did wear CPAP for 6 years and he doesn't feel like he needs it anymore.   I have reviewed the patient's family social and past medical history and updated as appropriate.   Past Medical History:  Diagnosis Date  . Abdominal hernia    pt. states he had it for 10 yrs or longer  . Abnormal findings on cardiac catheterization    ! Do NOT use RIGHT radial access on future caths or PCI.  Marland Kitchen Acute non-ST elevation myocardial infarction (NSTEMI) ([facility]) 04/12/2014  . Acute non-ST segment elevation myocardial infarction ([place]) 04/12/2014  . Adiposity 04/14/2014  . AKI (acute kidney injury) ([place]) 04/14/2014  . Anginal pain ([place])   . Arthritis   . Back pain   . Benign essential hypertension 12/20/2016  . CAD (coronary artery disease)    a. NSTEMI 04/12/14 s/p DES x 2 to RCA. Prox 3.5x38 mm Xience Alpine DES, Dist 3.5x28 mm Xience DES; EF 65-70%. Staged PCI to the OM1 with 2.75 x 12 mm Resolute DES     . Chronic coronary artery disease 04/14/2014  . Chronic lower back pain 07/13/2014  . Dyspepsia 03/07/2016  . Dyspnea   . GERD (gastroesophageal reflux disease)   . H/O hiatal hernia   . Hyperlipidemia   . Injury of kidney 04/14/2014  . Muscle soreness 04/27/2014  . NSTEMI (non-ST elevated myocardial infarction) ([place]) 04/12/2014  .  Obesity 04/14/2014  . Obstructive apnea 12/28/2014  . OSA on CPAP   . Osteoarthrosis 09/30/2014  . Primary insomnia 03/07/2016  . Pure hypercholesterolemia 12/28/2014  . SLEEP APNEA 02/24/2009   Qualifier: Diagnosis of  By: Annamaria Boots MD, Kasandra Knudsen     Past Surgical History:  Procedure Laterality Date  . APPENDECTOMY     Remote  . CARPAL TUNNEL RELEASE Bilateral   . CORONARY ANGIOPLASTY  04/12/14   STENT TO RCA  . CORONARY ARTERY BYPASS GRAFT N/A 06/06/2018   Procedure: CORONARY ARTERY BYPASS GRAFTING (CABG) x 3 WITH ENDOSCOPIC HARVESTING OF RIGHT SAPHENOUS VEIN;  Surgeon: Ivin Poot, MD;  Location: [place];  Service: Open Heart Surgery;  Laterality: N/A;  . KNEE ARTHROSCOPY Bilateral   . LEFT HEART CATH AND CORONARY ANGIOGRAPHY N/A 05/30/2018   Procedure: LEFT HEART CATH AND CORONARY ANGIOGRAPHY;  Surgeon: Sherren Mocha, MD;  Location: [place] CV LAB;  Service: Cardiovascular;  Laterality: N/A;  . LEFT HEART CATHETERIZATION WITH CORONARY ANGIOGRAM N/A 04/12/2014   Procedure: LEFT HEART CATHETERIZATION WITH CORONARY ANGIOGRAM;  Surgeon: Blane Ohara, MD;  Location: [facility] CATH LAB;  Service: Cardiovascular;  Laterality: N/A;  . PERCUTANEOUS CORONARY STENT INTERVENTION (PCI-S)  04/12/2014   Procedure: PERCUTANEOUS CORONARY STENT INTERVENTION (PCI-S);  Surgeon: Blane Ohara, MD;  Location: [facility] CATH LAB;  Service: Cardiovascular;;  .  PERCUTANEOUS CORONARY STENT INTERVENTION (PCI-S) N/A 04/14/2014   Procedure: PERCUTANEOUS CORONARY STENT INTERVENTION (PCI-S);  Surgeon: Peter M Martinique, MD;  Location: [facility] CATH LAB;  Service: Cardiovascular;  Laterality: N/A;  . TEE WITHOUT CARDIOVERSION N/A 06/06/2018   Procedure: TRANSESOPHAGEAL ECHOCARDIOGRAM (TEE);  Surgeon: Prescott Gum, Collier Salina, MD;  Location: [place];  Service: Open Heart Surgery;  Laterality: N/A;    Family History  Problem Relation Age of Onset  . Coronary artery disease Mother 67  . Emphysema Father        Ecologist    Social History    Occupational History  . Not on file  Tobacco Use  . Smoking status: Former Smoker    Packs/day: 1.00    Years: 20.00    Pack years: 20.00    Types: Cigarettes    Quit date: 11/26/1978    Years since quitting: 42.0  . Smokeless tobacco: Never Used  Substance and Sexual Activity  . Alcohol use: No  . Drug use: No  . Sexual activity: Not on file     Physical Exam: Blood pressure 130/74, pulse 65, temperature (!) 97.3 F (36.3 C), height 5\' 11"  (1.803 m), weight 272 lb (123.4 kg), SpO2 98 %.  Gen:      No acute distress Lungs:    No increased respiratory effort, symmetric chest wall excursion, clear to auscultation bilaterally, no wheezes or crackles CV:         Regular rate and rhythm; no murmurs, rubs, or gallops.  No pedal edema   Data Reviewed: Imaging: I have personally reviewed the chest xray sept 2021 shows no acute process  PFTs:  PFT Results Latest Ref Rng & Units 06/05/2018  FVC-Pre L 3.51  FVC-Predicted Pre % 80  FVC-Post L 3.36  FVC-Predicted Post % 77  Pre FEV1/FVC % % 80  Post FEV1/FCV % % 80  FEV1-Pre L 2.82  FEV1-Predicted Pre % 89  FEV1-Post L 2.68  DLCO uncorrected ml/min/mmHg 22.20  DLCO UNC% % 65  DLCO corrected ml/min/mmHg 22.59  DLCO COR %Predicted % 67  DLVA Predicted % 94  TLC L 3.91  TLC % Predicted % 53  RV % Predicted % 13   I have personally reviewed the patient's PFTs and in 2019 which show mild restriction to ventilation most likely secondary to body habitus. Normal spirometry and no significant response to BD.   Labs: Lab Results  Component Value Date   WBC 5.1 06/15/2020   HGB 14.2 06/15/2020   HCT 41.2 06/15/2020   MCV 95 06/15/2020   PLT 150 06/15/2020   Lab Results  Component Value Date   NA 143 06/15/2020   K 4.4 06/15/2020   CL 105 06/15/2020   CO2 26 06/15/2020    Immunization status: Immunization History  Administered Date(s) Administered  . Fluad Quad(high Dose 65+) 08/24/2020  . Influenza, High Dose  Seasonal PF 10/04/2015  . Influenza-Unspecified 09/01/2014, 07/31/2016  . PFIZER SARS-COV-2 Vaccination 02/04/2020, 03/01/2020  . Pneumococcal Conjugate-13 06/26/2016  . Pneumococcal Polysaccharide-23 04/13/2014, 04/01/2015  . Zoster 07/17/2016  . Zoster Recombinat (Shingrix) 04/24/2017    Assessment:  Shortness of breath suspect multifactorial from cardiac disease, obesity and deconditioning Coronary disease status post CABG - stable History of tobacco use, quit 1980, 20 pack years - stable History of sleep apnea, not on CPAP - stable  Plan/Recommendations: No improvement with albuterol, ok to hold for now. He feels his symptoms are strongly correlating with weather changes - hot weather makes things worse.  He does not think his symptoms are related to previous sleep apnea. We discussed if symptoms worse or return, to return to care. He will see [place] back as needed.    I spent 23 minutes on 12/05/2020 in care of this patient including face to face time and non-face to face time spent charting, review of outside records, and coordination of care.   Lenice Llamas, MD Pulmonary and [place]

## 2020-12-05 NOTE — Patient Instructions (Signed)
Come back and see [place] as needed if breathing gets worse or you get tired.

## 2021-01-05 ENCOUNTER — Ambulatory Visit: Payer: Medicare HMO | Admitting: Physician Assistant

## 2021-01-17 DIAGNOSIS — Z23 Encounter for immunization: Secondary | ICD-10-CM | POA: Diagnosis not present

## 2021-01-17 DIAGNOSIS — K219 Gastro-esophageal reflux disease without esophagitis: Secondary | ICD-10-CM | POA: Diagnosis not present

## 2021-01-17 DIAGNOSIS — Z0000 Encounter for general adult medical examination without abnormal findings: Secondary | ICD-10-CM | POA: Diagnosis not present

## 2021-01-17 DIAGNOSIS — I10 Essential (primary) hypertension: Secondary | ICD-10-CM | POA: Diagnosis not present

## 2021-01-17 DIAGNOSIS — M48062 Spinal stenosis, lumbar region with neurogenic claudication: Secondary | ICD-10-CM | POA: Diagnosis not present

## 2021-01-17 DIAGNOSIS — Z1159 Encounter for screening for other viral diseases: Secondary | ICD-10-CM | POA: Diagnosis not present

## 2021-01-17 DIAGNOSIS — E7800 Pure hypercholesterolemia, unspecified: Secondary | ICD-10-CM | POA: Diagnosis not present

## 2021-01-17 DIAGNOSIS — I25810 Atherosclerosis of coronary artery bypass graft(s) without angina pectoris: Secondary | ICD-10-CM | POA: Diagnosis not present

## 2021-01-19 DIAGNOSIS — D485 Neoplasm of uncertain behavior of skin: Secondary | ICD-10-CM | POA: Diagnosis not present

## 2021-01-19 DIAGNOSIS — Z85828 Personal history of other malignant neoplasm of skin: Secondary | ICD-10-CM | POA: Diagnosis not present

## 2021-01-19 DIAGNOSIS — L821 Other seborrheic keratosis: Secondary | ICD-10-CM | POA: Diagnosis not present

## 2021-01-19 DIAGNOSIS — L570 Actinic keratosis: Secondary | ICD-10-CM | POA: Diagnosis not present

## 2021-01-19 DIAGNOSIS — C44219 Basal cell carcinoma of skin of left ear and external auricular canal: Secondary | ICD-10-CM | POA: Diagnosis not present

## 2021-01-19 DIAGNOSIS — L814 Other melanin hyperpigmentation: Secondary | ICD-10-CM | POA: Diagnosis not present

## 2021-01-19 DIAGNOSIS — C44619 Basal cell carcinoma of skin of left upper limb, including shoulder: Secondary | ICD-10-CM | POA: Diagnosis not present

## 2021-02-08 DIAGNOSIS — C44229 Squamous cell carcinoma of skin of left ear and external auricular canal: Secondary | ICD-10-CM | POA: Diagnosis not present

## 2021-02-28 ENCOUNTER — Ambulatory Visit: Payer: Medicare HMO | Admitting: Cardiovascular Disease

## 2021-02-28 ENCOUNTER — Other Ambulatory Visit: Payer: Self-pay

## 2021-02-28 ENCOUNTER — Encounter: Payer: Self-pay | Admitting: Cardiovascular Disease

## 2021-02-28 VITALS — BP 120/64 | HR 68 | Ht 71.0 in | Wt 266.0 lb

## 2021-02-28 DIAGNOSIS — E782 Mixed hyperlipidemia: Secondary | ICD-10-CM

## 2021-02-28 DIAGNOSIS — I25119 Atherosclerotic heart disease of native coronary artery with unspecified angina pectoris: Secondary | ICD-10-CM

## 2021-02-28 DIAGNOSIS — I10 Essential (primary) hypertension: Secondary | ICD-10-CM

## 2021-02-28 DIAGNOSIS — R0609 Other forms of dyspnea: Secondary | ICD-10-CM

## 2021-02-28 DIAGNOSIS — R0600 Dyspnea, unspecified: Secondary | ICD-10-CM | POA: Diagnosis not present

## 2021-02-28 NOTE — Patient Instructions (Addendum)
Medication Instructions:  Your provider recommends that you continue on your current medications as directed. Please refer to the Current Medication list given to you today.   *If you need a refill on your cardiac medications before your next appointment, please call your pharmacy*  Testing/Procedures: Your provider has requested that you have an echocardiogram. Echocardiography is a painless test that uses sound waves to create images of your heart. It provides your doctor with information about the size and shape of your heart and how well your heart's chambers and valves are working. This procedure takes approximately one hour. There are no restrictions for this procedure.    Your provider has requested that you have a lexiscan myoview. For further information please visit [email or web address]. Please follow instruction sheet, as given.  Follow-Up: At [facility], you and your health needs are our priority.  As part of our continuing mission to provide you with exceptional heart care, we have created designated Provider Care Teams.  These Care Teams include your primary Cardiologist (physician) and Advanced Practice Providers (APPs -  Physician Assistants and Nurse Practitioners) who all work together to provide you with the care you need, when you need it. Your next appointment:   12 month(s) The format for your next appointment:   In Person Provider:   You may see Sherren Mocha, MD or one of the following Advanced Practice Providers on your designated Care Team:    Richardson Dopp, PA-C  [place], [place]

## 2021-02-28 NOTE — Progress Notes (Signed)
Cardiology Office Note:    Date:  02/28/2021   ID:  Allen James, DOB Nov 23, 1941, MRN [id number]  PCP:  Delilah Shan, [place]  Cardiologist:  Sherren Mocha, MD  Advanced Practice Provider:  No care team member to display Electrophysiologist:  None       Referring MD: Delilah Shan, MD   Chief Complaint  Patient presents with  . Shortness of Breath    History of Present Illness:    Allen James is a 80 y.o. male with a hx of CAD s/p CABG in 2019, presenting for follow-up evaluation. He is here alone today. While he denies resting symptoms, he reports progressive symptoms of shortness of breath and chest discomfort. His biggest problem is shortness of breath. He states that he 'gasps for breath' with walking 30 yards to his mailbox. No orthopnea or PND. He has mild leg swelling but no significant problems with edema.  At rest he has no symptoms at all.  He denies cough or congestion.  He denies wheezing.  Past Medical History:  Diagnosis Date  . Abdominal hernia    pt. states he had it for 10 yrs or longer  . Abnormal findings on cardiac catheterization    ! Do NOT use RIGHT radial access on future caths or PCI.  Marland Kitchen Acute non-ST elevation myocardial infarction (NSTEMI) ([place]) 04/12/2014  . Acute non-ST segment elevation myocardial infarction ([employer]) 04/12/2014  . Adiposity 04/14/2014  . AKI (acute kidney injury) ([place]) 04/14/2014  . Anginal pain ([place])   . Arthritis   . Back pain   . Benign essential hypertension 12/20/2016  . CAD (coronary artery disease)    a. NSTEMI 04/12/14 s/p DES x 2 to RCA. Prox 3.5x38 mm Xience Alpine DES, Dist 3.5x28 mm Xience DES; EF 65-70%. Staged PCI to the OM1 with 2.75 x 12 mm Resolute DES     . Chronic coronary artery disease 04/14/2014  . Chronic lower back pain 07/13/2014  . Dyspepsia 03/07/2016  . Dyspnea   . GERD (gastroesophageal reflux disease)   . H/O hiatal hernia   . Hyperlipidemia   . Injury of kidney  04/14/2014  . Muscle soreness 04/27/2014  . NSTEMI (non-ST elevated myocardial infarction) ([place]) 04/12/2014  . Obesity 04/14/2014  . Obstructive apnea 12/28/2014  . OSA on CPAP   . Osteoarthrosis 09/30/2014  . Primary insomnia 03/07/2016  . Pure hypercholesterolemia 12/28/2014  . SLEEP APNEA 02/24/2009   Qualifier: Diagnosis of  By: Annamaria Boots MD, Kasandra Knudsen     Past Surgical History:  Procedure Laterality Date  . APPENDECTOMY     Remote  . CARPAL TUNNEL RELEASE Bilateral   . CORONARY ANGIOPLASTY  04/12/14   STENT TO RCA  . CORONARY ARTERY BYPASS GRAFT N/A 06/06/2018   Procedure: CORONARY ARTERY BYPASS GRAFTING (CABG) x 3 WITH ENDOSCOPIC HARVESTING OF RIGHT SAPHENOUS VEIN;  Surgeon: Ivin Poot, MD;  Location: [place];  Service: Open Heart Surgery;  Laterality: N/A;  . KNEE ARTHROSCOPY Bilateral   . LEFT HEART CATH AND CORONARY ANGIOGRAPHY N/A 05/30/2018   Procedure: LEFT HEART CATH AND CORONARY ANGIOGRAPHY;  Surgeon: Sherren Mocha, MD;  Location: [place] CV LAB;  Service: Cardiovascular;  Laterality: N/A;  . LEFT HEART CATHETERIZATION WITH CORONARY ANGIOGRAM N/A 04/12/2014   Procedure: LEFT HEART CATHETERIZATION WITH CORONARY ANGIOGRAM;  Surgeon: Blane Ohara, MD;  Location: [facility] CATH LAB;  Service: Cardiovascular;  Laterality: N/A;  . PERCUTANEOUS CORONARY STENT INTERVENTION (  PCI-S)  04/12/2014   Procedure: PERCUTANEOUS CORONARY STENT INTERVENTION (PCI-S);  Surgeon: Blane Ohara, MD;  Location: [facility] CATH LAB;  Service: Cardiovascular;;  . PERCUTANEOUS CORONARY STENT INTERVENTION (PCI-S) N/A 04/14/2014   Procedure: PERCUTANEOUS CORONARY STENT INTERVENTION (PCI-S);  Surgeon: Peter M Martinique, MD;  Location: [facility] CATH LAB;  Service: Cardiovascular;  Laterality: N/A;  . TEE WITHOUT CARDIOVERSION N/A 06/06/2018   Procedure: TRANSESOPHAGEAL ECHOCARDIOGRAM (TEE);  Surgeon: Prescott Gum, Collier Salina, MD;  Location: [place];  Service: Open Heart Surgery;  Laterality: N/A;    Current Medications: Current Meds   Medication Sig  . albuterol (VENTOLIN HFA) 108 (90 Base) MCG/ACT inhaler Inhale 2 puffs into the lungs every 6 (six) hours as needed for shortness of breath.  Marland Kitchen amLODipine (NORVASC) 5 MG tablet Take 1 tablet by mouth daily.  Marland Kitchen aspirin EC 81 MG tablet Take 81 mg by mouth daily.  Marland Kitchen atorvastatin (LIPITOR) 40 MG tablet Take 1 tablet (40 mg total) by mouth daily at 6 PM.  . HYDROcodone-acetaminophen (NORCO/VICODIN) 5-325 MG per tablet Take 1 tablet by mouth 3 (three) times daily as needed for moderate pain.   . meloxicam (MOBIC) 15 MG tablet Take 15 mg by mouth daily.  . metoprolol succinate (TOPROL-XL) 50 MG 24 hr tablet Take 1 tablet (50 mg total) by mouth daily.  . traMADol (ULTRAM) 50 MG tablet Take 2 tablets (100 mg total) by mouth 2 (two) times daily.     Allergies:   Patient has no known allergies.   Social History   Socioeconomic History  . Marital status: Married    Spouse name: Not on file  . Number of children: Not on file  . Years of education: Not on file  . Highest education level: Not on file  Occupational History  . Not on file  Tobacco Use  . Smoking status: Former Smoker    Packs/day: 1.00    Years: 20.00    Pack years: 20.00    Types: Cigarettes    Quit date: 11/26/1978    Years since quitting: 42.2  . Smokeless tobacco: Never Used  Substance and Sexual Activity  . Alcohol use: No  . Drug use: No  . Sexual activity: Not on file  Other Topics Concern  . Not on file  Social History Narrative  . Not on file   Social Determinants of Health   Financial Resource Strain: Not on file  Food Insecurity: Not on file  Transportation Needs: Not on file  Physical Activity: Not on file  Stress: Not on file  Social Connections: Not on file     Family History: The patient's family history includes Coronary artery disease (age of onset: 60) in his mother; Emphysema in his father.  ROS:   Please see the history of present illness.    All other systems reviewed and  are negative.  EKGs/Labs/Other Studies Reviewed:    The following studies were reviewed today: Myoview Scan 12/03/2017: Study Highlights   Nuclear stress EF: 55%.  Blood pressure demonstrated a normal response to exercise.  There was no ST segment deviation noted during stress.  The study is normal.  This is a low risk study.  Normal perfusion  CARDIAC CATHETERIZATION Conclusion    The left ventricular systolic function is normal.  LV end diastolic pressure is mildly elevated.  The left ventricular ejection fraction is 55-65% by visual estimate.  Ost LAD lesion is 90% stenosed.  Prox LAD lesion is 50% stenosed.  Mid LAD lesion is  40% stenosed.  2nd Diag lesion is 40% stenosed.  Ost 1st Mrg lesion is 50% stenosed.  Previously placed 1st Mrg stent (unknown type) is widely patent.  Previously placed Prox RCA to Mid RCA stent (unknown type) is widely patent.  Previously placed Mid RCA to Dist RCA stent (unknown type) is widely patent.  Mid RCA lesion is 50% stenosed.   1.  Severe ostial LAD stenosis with mild to moderate proximal and mid LAD stenosis 2.  Patent left main with no significant stenosis 3.  Patent left circumflex stent (first OM) with moderate stenosis proximal to the stented segment 4.  Patent RCA stents with mild to moderate stenosis just between the stents in the mid RCA 5.  Normal LV function  Recommendations: Recommend cardiac surgical consultation for consideration of CABG.  Patient has diffuse coronary artery disease and severe stenosis involving the true ostium of the LAD.  I think he would benefit from surgical revascularization.  He will be instructed to discontinue clopidogrel in anticipation of CABG.  Will refer for outpatient surgical consult.  ECHO 11/24/2019 1. Left ventricular ejection fraction, by visual estimation, is 55 to  60%. The left ventricle has normal function. There is mildly increased  left ventricular hypertrophy.   2. Abnormal septal motion consistent with post-operative status.  3. Left ventricular diastolic parameters are consistent with Grade I  diastolic dysfunction (impaired relaxation).  4. The left ventricle has no regional wall motion abnormalities.  5. Global right ventricle has normal systolic function.The right  ventricular size is normal. No increase in right ventricular wall  thickness.  6. Left atrial size was normal.  7. Right atrial size was normal.  8. Presence of pericardial fat pad.  9. Trivial pericardial effusion is present.  10. Mild mitral annular calcification.  11. The mitral valve is degenerative. Trivial mitral valve regurgitation.  No evidence of mitral stenosis.  12. The tricuspid valve is grossly normal.  13. The aortic valve is tricuspid. Aortic valve regurgitation is mild. No  evidence of aortic valve sclerosis or stenosis.  14. Pulmonic regurgitation is mild.  15. The pulmonic valve was grossly normal. Pulmonic valve regurgitation is  mild.  16. Aortic dilatation noted.  17. 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  18. No significant change from prior study.   EKG:  EKG is not ordered today.   Recent Labs: 06/15/2020: ALT 19; BUN 16; Creatinine, Ser 1.18; Hemoglobin 14.2; NT-Pro BNP 192; Platelets 150; Potassium 4.4; Sodium 143  Recent Lipid Panel    Component Value Date/Time   CHOL 140 06/15/2020 1529   TRIG 156 (H) 06/15/2020 1529   HDL 45 06/15/2020 1529   CHOLHDL 3.1 06/15/2020 1529   CHOLHDL 4.7 05/03/2016 0859   VLDL 29 05/03/2016 0859   LDLCALC 68 06/15/2020 1529     Risk Assessment/Calculations:       Physical Exam:    VS:  BP 120/64   Pulse 68   Ht 5\' 11"  (1.803 m)   Wt 266 lb (120.7 kg)   SpO2 95%   BMI 37.10 kg/m     Wt Readings from Last 3 Encounters:  02/28/21 266 lb (120.7 kg)  12/05/20 272 lb (123.4 kg)  09/14/20 272 lb 6.4 oz (123.6 kg)     GEN:  Well nourished, well developed elderly male  in no acute distress HEENT: Normal NECK: No JVD; No carotid bruits LYMPHATICS: No lymphadenopathy CARDIAC: RRR, no murmurs, rubs, gallops RESPIRATORY:  Clear to  auscultation without rales, wheezing or rhonchi  ABDOMEN: Soft, non-tender, non-distended MUSCULOSKELETAL:  1+ right pretibial edema, trace left pretibial edema; No deformity  SKIN: Warm and dry NEUROLOGIC:  Alert and oriented x 3 PSYCHIATRIC:  Normal affect   ASSESSMENT:    1. DOE (dyspnea on exertion)   2. Coronary artery disease involving native coronary artery of native heart with angina pectoris ([place])   3. Essential hypertension   4. Mixed hyperlipidemia    PLAN:    In order of problems listed above:  1. Acute on chronic issue.  Suspect this is multifactorial with known ischemic heart disease, advanced age, sedentary lifestyle, and obesity.  However, he is 10 pounds lighter than he was last year and his symptoms have worsened.  I have recommended an echocardiogram to assess for LV or RV dysfunction.  I do not think he has any obvious valvular heart disease based on his exam and prior imaging studies which are outlined above.  Also consider ischemic heart disease, see discussion below. 2. Recommend Myoview scan to see if ischemic heart disease is playing a role in his progressive exercise intolerance also with a component of angina.  He remains on aspirin for antiplatelet therapy, high intensity statin drug, beta-blocker, and a calcium channel blocker.  We discussed lifestyle modification and weight loss today as well. 3. Blood pressure well controlled on amlodipine and metoprolol succinate. 4. Treated with atorvastatin 40 mg.  Cholesterol 103, HDL 37, LDL 50, triglycerides 78.  Lifestyle modification discussed.   Shared Decision Making/Informed Consent The risks [chest pain, shortness of breath, cardiac arrhythmias, dizziness, blood pressure fluctuations, myocardial infarction, stroke/transient ischemic attack, nausea,  vomiting, allergic reaction, radiation exposure, metallic taste sensation and life-threatening complications (estimated to be 1 in 10,000)], benefits (risk stratification, diagnosing coronary artery disease, treatment guidance) and alternatives of a nuclear stress test were discussed in detail with Mr. Audi and he agrees to proceed.       Medication Adjustments/Labs and Tests Ordered: Current medicines are reviewed at length with the patient today.  Concerns regarding medicines are outlined above.  Orders Placed This Encounter  Procedures  . MYOCARDIAL PERFUSION IMAGING  . ECHOCARDIOGRAM COMPLETE   No orders of the defined types were placed in this encounter.   Patient Instructions  Medication Instructions:  Your provider recommends that you continue on your current medications as directed. Please refer to the Current Medication list given to you today.   *If you need a refill on your cardiac medications before your next appointment, please call your pharmacy*  Testing/Procedures: Your provider has requested that you have an echocardiogram. Echocardiography is a painless test that uses sound waves to create images of your heart. It provides your doctor with information about the size and shape of your heart and how well your heart's chambers and valves are working. This procedure takes approximately one hour. There are no restrictions for this procedure.    Your provider has requested that you have a lexiscan myoview. For further information please visit [email or web address]. Please follow instruction sheet, as given.  Follow-Up: At [facility], you and your health needs are our priority.  As part of our continuing mission to provide you with exceptional heart care, we have created designated Provider Care Teams.  These Care Teams include your primary Cardiologist (physician) and Advanced Practice Providers (APPs -  Physician Assistants and Nurse Practitioners) who all work together  to provide you with the care you need, when you need it. Your next appointment:  12 month(s) The format for your next appointment:   In Person Provider:   You may see Sherren Mocha, MD or one of the following Advanced Practice Providers on your designated Care Team:    Richardson Dopp, PA-C  Robbie [place], [place]     Signed, Sherren Mocha, MD  02/28/2021 4:34 PM    [place]

## 2021-03-08 ENCOUNTER — Telehealth: Payer: Self-pay

## 2021-03-08 NOTE — Telephone Encounter (Signed)
Detailed instructions left on the patient's answering machine. Asked to call back with any questions. S.Aliea Bobe EMTP 

## 2021-03-14 ENCOUNTER — Other Ambulatory Visit: Payer: Self-pay

## 2021-03-14 ENCOUNTER — Ambulatory Visit (HOSPITAL_COMMUNITY): Payer: Medicare HMO | Attending: Internal Medicine

## 2021-03-14 DIAGNOSIS — R0600 Dyspnea, unspecified: Secondary | ICD-10-CM | POA: Insufficient documentation

## 2021-03-14 DIAGNOSIS — I25119 Atherosclerotic heart disease of native coronary artery with unspecified angina pectoris: Secondary | ICD-10-CM

## 2021-03-14 DIAGNOSIS — R0609 Other forms of dyspnea: Secondary | ICD-10-CM

## 2021-03-14 LAB — MYOCARDIAL PERFUSION IMAGING
LV dias vol: 85 mL (ref 62–150)
LV sys vol: 46 mL
Peak HR: 80 {beats}/min
Rest HR: 67 {beats}/min
SDS: 1
SRS: 0
SSS: 1
TID: 0.88

## 2021-03-14 MED ORDER — REGADENOSON 0.4 MG/5ML IV SOLN
0.4000 mg | Freq: Once | INTRAVENOUS | Status: AC
  Administered 2021-03-14: 0.4 mg via INTRAVENOUS

## 2021-03-14 MED ORDER — TECHNETIUM TC 99M TETROFOSMIN IV KIT
30.3000 | PACK | Freq: Once | INTRAVENOUS | Status: AC | PRN
  Administered 2021-03-14: 30.3 via INTRAVENOUS
  Filled 2021-03-14: qty 31

## 2021-03-14 MED ORDER — TECHNETIUM TC 99M TETROFOSMIN IV KIT
11.0000 | PACK | Freq: Once | INTRAVENOUS | Status: AC | PRN
  Administered 2021-03-14: 11 via INTRAVENOUS
  Filled 2021-03-14: qty 11

## 2021-03-15 ENCOUNTER — Ambulatory Visit (HOSPITAL_COMMUNITY)
Admission: RE | Admit: 2021-03-15 | Discharge: 2021-03-15 | Disposition: A | Discharge status: Home / Self Care | Payer: Medicare HMO | Source: Ambulatory Visit | Attending: Nurse Practitioner | Admitting: Nurse Practitioner

## 2021-03-15 ENCOUNTER — Encounter (HOSPITAL_COMMUNITY): Payer: Self-pay | Admitting: Nurse Practitioner

## 2021-03-15 VITALS — BP 112/62 | HR 92 | Ht 71.0 in | Wt 271.4 lb

## 2021-03-15 DIAGNOSIS — I2510 Atherosclerotic heart disease of native coronary artery without angina pectoris: Secondary | ICD-10-CM | POA: Diagnosis not present

## 2021-03-15 DIAGNOSIS — I4892 Unspecified atrial flutter: Secondary | ICD-10-CM | POA: Diagnosis not present

## 2021-03-15 DIAGNOSIS — Z7982 Long term (current) use of aspirin: Secondary | ICD-10-CM | POA: Insufficient documentation

## 2021-03-15 DIAGNOSIS — I10 Essential (primary) hypertension: Secondary | ICD-10-CM | POA: Diagnosis not present

## 2021-03-15 DIAGNOSIS — Z87891 Personal history of nicotine dependence: Secondary | ICD-10-CM | POA: Diagnosis not present

## 2021-03-15 DIAGNOSIS — R5383 Other fatigue: Secondary | ICD-10-CM | POA: Diagnosis not present

## 2021-03-15 DIAGNOSIS — Z79899 Other long term (current) drug therapy: Secondary | ICD-10-CM | POA: Insufficient documentation

## 2021-03-15 DIAGNOSIS — R0602 Shortness of breath: Secondary | ICD-10-CM | POA: Insufficient documentation

## 2021-03-15 DIAGNOSIS — Z7901 Long term (current) use of anticoagulants: Secondary | ICD-10-CM | POA: Diagnosis not present

## 2021-03-15 LAB — CBC
HCT: 39.9 % (ref 39.0–52.0)
Hemoglobin: 12.9 g/dL — ABNORMAL LOW (ref 13.0–17.0)
MCH: 31.3 pg (ref 26.0–34.0)
MCHC: 32.3 g/dL (ref 30.0–36.0)
MCV: 96.8 fL (ref 80.0–100.0)
Platelets: 127 10*3/uL — ABNORMAL LOW (ref 150–400)
RBC: 4.12 MIL/uL — ABNORMAL LOW (ref 4.22–5.81)
RDW: 12.9 % (ref 11.5–15.5)
WBC: 4.6 10*3/uL (ref 4.0–10.5)
nRBC: 0 % (ref 0.0–0.2)

## 2021-03-15 LAB — BASIC METABOLIC PANEL
Anion gap: 5 (ref 5–15)
BUN: 14 mg/dL (ref 8–23)
CO2: 23 mmol/L (ref 22–32)
Calcium: 8.5 mg/dL — ABNORMAL LOW (ref 8.9–10.3)
Chloride: 109 mmol/L (ref 98–111)
Creatinine, Ser: 1.07 mg/dL (ref 0.61–1.24)
GFR, Estimated: >60 mL/min (ref >60)
Glucose, Bld: 128 mg/dL — ABNORMAL HIGH (ref 70–99)
Potassium: 4.3 mmol/L (ref 3.5–5.1)
Sodium: 137 mmol/L (ref 135–145)

## 2021-03-15 LAB — TSH: TSH: 4.035 u[IU]/mL (ref 0.350–4.500)

## 2021-03-15 MED ORDER — METOPROLOL SUCCINATE ER 50 MG PO TB24: ORAL_TABLET | ORAL | 3 refills | Status: AC

## 2021-03-15 MED ORDER — ELIQUIS 5 MG PO TABS: 5.0000 mg | ORAL_TABLET | Freq: Two times a day (BID) | ORAL | 3 refills | Status: DC

## 2021-03-15 NOTE — Patient Instructions (Signed)
Stop aspirin  Start Eliquis 5mg  twice a day  Increase metoprolol to 1 tablet in the morning and 1/2 tablet in the evening  Decrease amlodipine to 1/2 tablet once a day

## 2021-03-15 NOTE — Progress Notes (Signed)
Primary Care Physician: Delilah Shan, MD Referring Physician: Dr. Radford Pax( DOD 03/14/21)    AINSLEY SANGUINETTI is a 80 y.o. male with a h/o CAD, s/p CAD, HTN, that recently saw Dr. Burt Knack for 2-3 months of fatigue and shortness of breath on exertion. He was set up for a stress test yesterday and  was found to have atrial flutter. He was referred here. Echo is pending in May. Stress test yesterday was determined indeterminate due to mildly decreased EF at 45-54%. EKG  today shows atrial flutter at 92 bpm, qrs int 90 ms, qtc 509 ms. Pt states that he feels good sitting but is symptomatic with exertion.   Today, he denies symptoms of palpitations, chest pain, shortness of breath, orthopnea, PND, lower extremity edema, dizziness, presyncope, syncope, or neurologic sequela. The patient is tolerating medications without difficulties and is otherwise without complaint today.   Past Medical History:  Diagnosis Date  . Abdominal hernia    pt. states he had it for 10 yrs or longer  . Abnormal findings on cardiac catheterization    ! Do NOT use RIGHT radial access on future caths or PCI.  Marland Kitchen Acute non-ST elevation myocardial infarction (NSTEMI) ([place]) 04/12/2014  . Acute non-ST segment elevation myocardial infarction ([place]) 04/12/2014  . Adiposity 04/14/2014  . AKI (acute kidney injury) ([place]) 04/14/2014  . Anginal pain ([place])   . Arthritis   . Back pain   . Benign essential hypertension 12/20/2016  . CAD (coronary artery disease)    a. NSTEMI 04/12/14 s/p DES x 2 to RCA. Prox 3.5x38 mm Xience Alpine DES, Dist 3.5x28 mm Xience DES; EF 65-70%. Staged PCI to the OM1 with 2.75 x 12 mm Resolute DES     . Chronic coronary artery disease 04/14/2014  . Chronic lower back pain 07/13/2014  . Dyspepsia 03/07/2016  . Dyspnea   . GERD (gastroesophageal reflux disease)   . H/O hiatal hernia   . Hyperlipidemia   . Injury of kidney 04/14/2014  . Muscle soreness 04/27/2014  . NSTEMI (non-ST elevated myocardial infarction)  ([place]) 04/12/2014  . Obesity 04/14/2014  . Obstructive apnea 12/28/2014  . OSA on CPAP   . Osteoarthrosis 09/30/2014  . Primary insomnia 03/07/2016  . Pure hypercholesterolemia 12/28/2014  . SLEEP APNEA 02/24/2009   Qualifier: Diagnosis of  By: Annamaria Boots MD, Kasandra Knudsen    Past Surgical History:  Procedure Laterality Date  . APPENDECTOMY     Remote  . CARPAL TUNNEL RELEASE Bilateral   . CORONARY ANGIOPLASTY  04/12/14   STENT TO RCA  . CORONARY ARTERY BYPASS GRAFT N/A 06/06/2018   Procedure: CORONARY ARTERY BYPASS GRAFTING (CABG) x 3 WITH ENDOSCOPIC HARVESTING OF RIGHT SAPHENOUS VEIN;  Surgeon: Ivin Poot, MD;  Location: [facility];  Service: Open Heart Surgery;  Laterality: N/A;  . KNEE ARTHROSCOPY Bilateral   . LEFT HEART CATH AND CORONARY ANGIOGRAPHY N/A 05/30/2018   Procedure: LEFT HEART CATH AND CORONARY ANGIOGRAPHY;  Surgeon: Sherren Mocha, MD;  Location: [facility];  Service: Cardiovascular;  Laterality: N/A;  . LEFT HEART CATHETERIZATION WITH CORONARY ANGIOGRAM N/A 04/12/2014   Procedure: LEFT HEART CATHETERIZATION WITH CORONARY ANGIOGRAM;  Surgeon: Blane Ohara, MD;  Location: [facility] CATH LAB;  Service: Cardiovascular;  Laterality: N/A;  . PERCUTANEOUS CORONARY STENT INTERVENTION (PCI-S)  04/12/2014   Procedure: PERCUTANEOUS CORONARY STENT INTERVENTION (PCI-S);  Surgeon: Blane Ohara, MD;  Location: [facility] CATH LAB;  Service: Cardiovascular;;  . PERCUTANEOUS CORONARY STENT INTERVENTION (PCI-S) N/A 04/14/2014  Procedure: PERCUTANEOUS CORONARY STENT INTERVENTION (PCI-S);  Surgeon: Peter M Martinique, MD;  Location: [facility] CATH LAB;  Service: Cardiovascular;  Laterality: N/A;  . TEE WITHOUT CARDIOVERSION N/A 06/06/2018   Procedure: TRANSESOPHAGEAL ECHOCARDIOGRAM (TEE);  Surgeon: Prescott Gum, Collier Salina, MD;  Location: [facility];  Service: Open Heart Surgery;  Laterality: N/A;    Current Outpatient Medications  Medication Sig Dispense Refill  . amLODipine (NORVASC) 5 MG tablet Take 1 tablet by mouth daily.     Marland Kitchen aspirin EC 81 MG tablet Take 81 mg by mouth daily.    Marland Kitchen atorvastatin (LIPITOR) 40 MG tablet Take 1 tablet (40 mg total) by mouth daily at 6 PM. 90 tablet 3  . HYDROcodone-acetaminophen (NORCO/VICODIN) 5-325 MG per tablet Take 1 tablet by mouth 3 (three) times daily as needed for moderate pain.     . metoprolol succinate (TOPROL-XL) 50 MG 24 hr tablet Take 1 tablet (50 mg total) by mouth daily. 90 tablet 3  . pantoprazole (PROTONIX) 40 MG tablet Take 1 tablet (40 mg total) by mouth daily. 90 tablet 3  . traMADol (ULTRAM) 50 MG tablet Take 2 tablets (100 mg total) by mouth 2 (two) times daily. 60 tablet 2   No current facility-administered medications for this encounter.    Allergies  Allergen Reactions  . Antihistamines, Diphenhydramine-Type     Other reaction(s): shakey    Social History   Socioeconomic History  . Marital status: Married    Spouse name: Not on file  . Number of children: Not on file  . Years of education: Not on file  . Highest education level: Not on file  Occupational History  . Not on file  Tobacco Use  . Smoking status: Former Smoker    Packs/day: 1.00    Years: 20.00    Pack years: 20.00    Types: Cigarettes    Quit date: 11/26/1978    Years since quitting: 42.3  . Smokeless tobacco: Never Used  Substance and Sexual Activity  . Alcohol use: No  . Drug use: No  . Sexual activity: Not on file  Other Topics Concern  . Not on file  Social History Narrative  . Not on file   Social Determinants of Health   Financial Resource Strain: Not on file  Food Insecurity: Not on file  Transportation Needs: Not on file  Physical Activity: Not on file  Stress: Not on file  Social Connections: Not on file  Intimate Partner Violence: Not on file    Family History  Problem Relation Age of Onset  . Coronary artery disease Mother 29  . Emphysema Father        coal miner    ROS- All systems are reviewed and negative except as per the HPI above  Physical  Exam: Vitals:   03/15/21 0830  BP: 112/62  Pulse: 92  Weight: 123.1 kg  Height: 5\' 11"  (1.803 m)   Wt Readings from Last 3 Encounters:  03/15/21 123.1 kg  03/14/21 120.7 kg  02/28/21 120.7 kg    Labs: Lab Results  Component Value Date   NA 143 06/15/2020   K 4.4 06/15/2020   CL 105 06/15/2020   CO2 26 06/15/2020   GLUCOSE 108 (H) 06/15/2020   BUN 16 06/15/2020   CREATININE 1.18 06/15/2020   CALCIUM 9.2 06/15/2020   MG 2.1 06/11/2018   Lab Results  Component Value Date   INR 1.21 06/06/2018   Lab Results  Component Value Date   CHOL 140 06/15/2020  HDL 45 06/15/2020   LDLCALC 68 06/15/2020   TRIG 156 (H) 06/15/2020     GEN- The patient is well appearing, alert and oriented x 3 today.   Head- normocephalic, atraumatic Eyes-  Sclera clear, conjunctiva pink Ears- hearing intact Oropharynx- clear Neck- supple, no JVP Lymph- no cervical lymphadenopathy Lungs- Clear to ausculation bilaterally, normal work of breathing Heart- Regular rate and rhythm, no murmurs, rubs or gallops, PMI not laterally displaced GI- soft, NT, ND, + BS Extremities- no clubbing, cyanosis, or edema MS- no significant deformity or atrophy Skin- no rash or lesion Psych- euthymic mood, full affect Neuro- strength and sensation are intact  EKG- atrial flutter with variable block at 92 bpm, qrs int 90 ms, qtc 509 ms    Lexi myoview- 03/14/21- The left ventricular ejection fraction is mildly decreased (45-54%) though imaging performed in atrial flutter.  Nuclear stress EF: 46%.  There was no ST segment deviation noted during stress.  No T wave inversion was noted during stress.  This is an intermediate risk study in the setting of decrease LVEF   IMPRESSIONS Atrial Flutter during exam. Sensitivity for accurate LVEF is decreased in the setting of arrhythmia.  RECOMMENDATIONS/CONCLUSIONS Stress test is negative for ischemic or infarction. The study is at most an intermediate risk  study because of LVEF as above. Compared to 2018 study, no change in perfusion.   Assessment and Plan: 1. New onset atrial flutter  General education re afib/flutter  This may be the reason for his symptoms of fatigue and shortness of breath with  activity for the last 2-3 months  He is already on metoprolol succinate 50 mg but will increase to 1/2 tab at hs, for better rate control   2. CHA2DS2VASc score of 4 Denies bleeding history Mild nose bleed intermittently Will stop ASA Start Eliquis 5 mg bid Bleeding precautions discussed   I will see back in one week  May need cardioversion in the near future after adequately anticoagulated    Butch Penny C. Gigi Onstad, [facility] [address] [place], [place] [address] [phone]

## 2021-03-22 ENCOUNTER — Other Ambulatory Visit: Payer: Self-pay

## 2021-03-22 ENCOUNTER — Encounter (HOSPITAL_COMMUNITY): Payer: Self-pay | Admitting: Nurse Practitioner

## 2021-03-22 ENCOUNTER — Ambulatory Visit (HOSPITAL_COMMUNITY)
Admission: RE | Admit: 2021-03-22 | Discharge: 2021-03-22 | Disposition: A | Discharge status: Home / Self Care | Payer: Medicare HMO | Source: Ambulatory Visit | Attending: Nurse Practitioner | Admitting: Nurse Practitioner

## 2021-03-22 VITALS — BP 124/66 | HR 73 | Ht 71.0 in | Wt 262.4 lb

## 2021-03-22 DIAGNOSIS — Z87891 Personal history of nicotine dependence: Secondary | ICD-10-CM | POA: Insufficient documentation

## 2021-03-22 DIAGNOSIS — I252 Old myocardial infarction: Secondary | ICD-10-CM | POA: Insufficient documentation

## 2021-03-22 DIAGNOSIS — Z7901 Long term (current) use of anticoagulants: Secondary | ICD-10-CM | POA: Insufficient documentation

## 2021-03-22 DIAGNOSIS — I4892 Unspecified atrial flutter: Secondary | ICD-10-CM | POA: Diagnosis not present

## 2021-03-22 DIAGNOSIS — E7800 Pure hypercholesterolemia, unspecified: Secondary | ICD-10-CM | POA: Insufficient documentation

## 2021-03-22 DIAGNOSIS — Z79899 Other long term (current) drug therapy: Secondary | ICD-10-CM | POA: Diagnosis not present

## 2021-03-22 DIAGNOSIS — R600 Localized edema: Secondary | ICD-10-CM | POA: Insufficient documentation

## 2021-03-22 DIAGNOSIS — I10 Essential (primary) hypertension: Secondary | ICD-10-CM | POA: Diagnosis not present

## 2021-03-22 DIAGNOSIS — I2510 Atherosclerotic heart disease of native coronary artery without angina pectoris: Secondary | ICD-10-CM | POA: Insufficient documentation

## 2021-03-22 DIAGNOSIS — D6869 Other thrombophilia: Secondary | ICD-10-CM

## 2021-03-22 DIAGNOSIS — Z951 Presence of aortocoronary bypass graft: Secondary | ICD-10-CM | POA: Diagnosis not present

## 2021-03-22 MED ORDER — FUROSEMIDE 20 MG PO TABS: ORAL_TABLET | ORAL | 0 refills | Status: AC

## 2021-03-22 NOTE — Patient Instructions (Signed)
Start lasix 20mg  once a day for the next 3 days then use only as needed for swelling

## 2021-03-22 NOTE — Progress Notes (Signed)
Primary Care Physician: Delilah Shan, MD Referring Physician: Dr. Radford Pax( DOD 03/14/21)  Cardiologist: Dr. Verlon Setting is a 80 y.o. male with a h/o CAD, s/p CAD, HTN, that recently saw Dr. Burt Knack for 2-3 months of fatigue and shortness of breath on exertion. He was set up for a stress test yesterday and  was found to have atrial flutter. He was referred here. Echo is pending in May. Stress test yesterday was determined indeterminate due to mildly decreased EF at 45-54%. EKG  today shows atrial flutter at 92 bpm, qrs int 90 ms, qtc 509 ms. Pt states that he feels good sitting but is symptomatic with exertion.   F/u 4/27. On last visit, he was placed on anticoagulation and BB was increased. He states that he feels better with less exertional dyspnea. He is better rate controlled today with v rates in the 70's. He is being compliant with eliquis, first full day of anticoagulation was 4/21. He is noting LLE.  Will rx diuretic. Will plan on cardioversion after 5/12.   Today, he denies symptoms of palpitations, chest pain, shortness of breath, orthopnea, PND, lower extremity edema, dizziness, presyncope, syncope, or neurologic sequela. The patient is tolerating medications without difficulties and is otherwise without complaint today.   Past Medical History:  Diagnosis Date  . Abdominal hernia    pt. states he had it for 10 yrs or longer  . Abnormal findings on cardiac catheterization    ! Do NOT use RIGHT radial access on future caths or PCI.  Marland Kitchen Acute non-ST elevation myocardial infarction (NSTEMI) ([place]) 04/12/2014  . Acute non-ST segment elevation myocardial infarction ([place]) 04/12/2014  . Adiposity 04/14/2014  . AKI (acute kidney injury) ([place]) 04/14/2014  . Anginal pain ([place])   . Arthritis   . Back pain   . Benign essential hypertension 12/20/2016  . CAD (coronary artery disease)    a. NSTEMI 04/12/14 s/p DES x 2 to RCA. Prox 3.5x38 mm Xience Alpine DES, Dist 3.5x28 mm Xience DES;  EF 65-70%. Staged PCI to the OM1 with 2.75 x 12 mm Resolute DES     . Chronic coronary artery disease 04/14/2014  . Chronic lower back pain 07/13/2014  . Dyspepsia 03/07/2016  . Dyspnea   . GERD (gastroesophageal reflux disease)   . H/O hiatal hernia   . Hyperlipidemia   . Injury of kidney 04/14/2014  . Muscle soreness 04/27/2014  . NSTEMI (non-ST elevated myocardial infarction) ([place]) 04/12/2014  . Obesity 04/14/2014  . Obstructive apnea 12/28/2014  . OSA on CPAP   . Osteoarthrosis 09/30/2014  . Primary insomnia 03/07/2016  . Pure hypercholesterolemia 12/28/2014  . SLEEP APNEA 02/24/2009   Qualifier: Diagnosis of  By: Annamaria Boots MD, Kasandra Knudsen    Past Surgical History:  Procedure Laterality Date  . APPENDECTOMY     Remote  . CARPAL TUNNEL RELEASE Bilateral   . CORONARY ANGIOPLASTY  04/12/14   STENT TO RCA  . CORONARY ARTERY BYPASS GRAFT N/A 06/06/2018   Procedure: CORONARY ARTERY BYPASS GRAFTING (CABG) x 3 WITH ENDOSCOPIC HARVESTING OF RIGHT SAPHENOUS VEIN;  Surgeon: Ivin Poot, MD;  Location: [facility];  Service: Open Heart Surgery;  Laterality: N/A;  . KNEE ARTHROSCOPY Bilateral   . LEFT HEART CATH AND CORONARY ANGIOGRAPHY N/A 05/30/2018   Procedure: LEFT HEART CATH AND CORONARY ANGIOGRAPHY;  Surgeon: Sherren Mocha, MD;  Location: [facility];  Service: Cardiovascular;  Laterality: N/A;  . LEFT HEART CATHETERIZATION WITH CORONARY ANGIOGRAM  N/A 04/12/2014   Procedure: LEFT HEART CATHETERIZATION WITH CORONARY ANGIOGRAM;  Surgeon: Blane Ohara, MD;  Location: [facility] CATH LAB;  Service: Cardiovascular;  Laterality: N/A;  . PERCUTANEOUS CORONARY STENT INTERVENTION (PCI-S)  04/12/2014   Procedure: PERCUTANEOUS CORONARY STENT INTERVENTION (PCI-S);  Surgeon: Blane Ohara, MD;  Location: [facility] CATH LAB;  Service: Cardiovascular;;  . PERCUTANEOUS CORONARY STENT INTERVENTION (PCI-S) N/A 04/14/2014   Procedure: PERCUTANEOUS CORONARY STENT INTERVENTION (PCI-S);  Surgeon: Peter M Martinique, MD;  Location: [facility]  CATH LAB;  Service: Cardiovascular;  Laterality: N/A;  . TEE WITHOUT CARDIOVERSION N/A 06/06/2018   Procedure: TRANSESOPHAGEAL ECHOCARDIOGRAM (TEE);  Surgeon: Prescott Gum, Collier Salina, MD;  Location: [place];  Service: Open Heart Surgery;  Laterality: N/A;    Current Outpatient Medications  Medication Sig Dispense Refill  . amLODipine (NORVASC) 5 MG tablet Take 0.5 tablets by mouth daily.    Marland Kitchen apixaban (ELIQUIS) 5 MG TABS tablet Take 1 tablet (5 mg total) by mouth 2 (two) times daily. 60 tablet 3  . atorvastatin (LIPITOR) 40 MG tablet Take 1 tablet (40 mg total) by mouth daily at 6 PM. 90 tablet 3  . furosemide (LASIX) 20 MG tablet Take 1 tablet by mouth daily for 3 days then only as needed for swelling 10 tablet 0  . HYDROcodone-acetaminophen (NORCO/VICODIN) 5-325 MG per tablet Take 1 tablet by mouth 3 (three) times daily as needed for moderate pain.     . metoprolol succinate (TOPROL-XL) 50 MG 24 hr tablet Take 1 tablet in the AM and 1/2 tablet in the PM 90 tablet 3  . pantoprazole (PROTONIX) 40 MG tablet Take 1 tablet (40 mg total) by mouth daily. 90 tablet 3  . traMADol (ULTRAM) 50 MG tablet Take 2 tablets (100 mg total) by mouth 2 (two) times daily. 60 tablet 2   No current facility-administered medications for this encounter.    Allergies  Allergen Reactions  . Antihistamines, Diphenhydramine-Type     Other reaction(s): shakey    Social History   Socioeconomic History  . Marital status: Married    Spouse name: Not on file  . Number of children: Not on file  . Years of education: Not on file  . Highest education level: Not on file  Occupational History  . Not on file  Tobacco Use  . Smoking status: Former Smoker    Packs/day: 1.00    Years: 20.00    Pack years: 20.00    Types: Cigarettes    Quit date: 11/26/1978    Years since quitting: 42.3  . Smokeless tobacco: Never Used  Substance and Sexual Activity  . Alcohol use: No  . Drug use: No  . Sexual activity: Not on file  Other  Topics Concern  . Not on file  Social History Narrative  . Not on file   Social Determinants of Health   Financial Resource Strain: Not on file  Food Insecurity: Not on file  Transportation Needs: Not on file  Physical Activity: Not on file  Stress: Not on file  Social Connections: Not on file  Intimate Partner Violence: Not on file    Family History  Problem Relation Age of Onset  . Coronary artery disease Mother 33  . Emphysema Father        coal miner    ROS- All systems are reviewed and negative except as per the HPI above  Physical Exam: Vitals:   03/22/21 0932  BP: 124/66  Pulse: 73  Weight: 119 kg  Height:  5\' 11"  (1.803 m)   Wt Readings from Last 3 Encounters:  03/22/21 119 kg  03/15/21 123.1 kg  03/14/21 120.7 kg    Labs: Lab Results  Component Value Date   NA 137 03/15/2021   K 4.3 03/15/2021   CL 109 03/15/2021   CO2 23 03/15/2021   GLUCOSE 128 (H) 03/15/2021   BUN 14 03/15/2021   CREATININE 1.07 03/15/2021   CALCIUM 8.5 (L) 03/15/2021   MG 2.1 06/11/2018   Lab Results  Component Value Date   INR 1.21 06/06/2018   Lab Results  Component Value Date   CHOL 140 06/15/2020   HDL 45 06/15/2020   LDLCALC 68 06/15/2020   TRIG 156 (H) 06/15/2020     GEN- The patient is well appearing, alert and oriented x 3 today.   Head- normocephalic, atraumatic Eyes-  Sclera clear, conjunctiva pink Ears- hearing intact Oropharynx- clear Neck- supple, no JVP Lymph- no cervical lymphadenopathy Lungs- Clear to ausculation bilaterally, normal work of breathing Heart- irregular rate and rhythm, no murmurs, rubs or gallops, PMI not laterally displaced GI- soft, NT, ND, + BS Extremities- no clubbing, cyanosis, or edema, 1+ LLE MS- no significant deformity or atrophy Skin- no rash or lesion Psych- euthymic mood, full affect Neuro- strength and sensation are intact  EKG-  appears to be typical flutter at 73 bpm, qrs int 90 ms, qtc 462 ms, compared to last  weeks EKG, not as clear cut by may be typical flutter as well    Lexi myoview- 03/14/21- The left ventricular ejection fraction is mildly decreased (45-54%) though imaging performed in atrial flutter.  Nuclear stress EF: 46%.  There was no ST segment deviation noted during stress.  No T wave inversion was noted during stress.  This is an intermediate risk study in the setting of decrease LVEF   IMPRESSIONS Atrial Flutter during exam. Sensitivity for accurate LVEF is decreased in the setting of arrhythmia.  RECOMMENDATIONS/CONCLUSIONS Stress test is negative for ischemic or infarction. The study is at most an intermediate risk study because of LVEF as above. Compared to 2018 study, no change in perfusion.   Assessment and Plan: 1. New onset atrial flutter (? Typical)  General education re afib/flutter  This may be the reason for his symptoms of fatigue and shortness of breath with  activity for the last 2-3 months  He feels improved on the higher dose of BB, with better rate control, continue  on metoprolol succinate 50 mg 1/2 tab at hs   2. CHA2DS2VASc score of 4 Denies bleeding history Mild nose bleed intermittently Off ASA Continue  Eliquis 5 mg bid, first full day was 4/21, eligible for CV after 5/12  3. LLE Will RX lasix 20 mg daily x 3 days then as needed for LLE, wight gain of 3-5 lbs   I will see back in one week to get set up for cardioversion     Butch Penny C. Alessia Gonsalez, [facility] [address] [place], [place] [address] [phone]

## 2021-03-29 ENCOUNTER — Encounter (HOSPITAL_COMMUNITY): Payer: Self-pay | Admitting: Nurse Practitioner

## 2021-03-29 ENCOUNTER — Other Ambulatory Visit (HOSPITAL_COMMUNITY): Payer: Self-pay | Admitting: *Deleted

## 2021-03-29 ENCOUNTER — Ambulatory Visit (HOSPITAL_COMMUNITY)
Admission: RE | Admit: 2021-03-29 | Discharge: 2021-03-29 | Disposition: A | Discharge status: Home / Self Care | Payer: Medicare HMO | Source: Ambulatory Visit | Attending: Nurse Practitioner | Admitting: Nurse Practitioner

## 2021-03-29 ENCOUNTER — Other Ambulatory Visit: Payer: Self-pay

## 2021-03-29 VITALS — BP 114/64 | HR 67 | Ht 71.0 in | Wt 262.6 lb

## 2021-03-29 DIAGNOSIS — I2510 Atherosclerotic heart disease of native coronary artery without angina pectoris: Secondary | ICD-10-CM | POA: Insufficient documentation

## 2021-03-29 DIAGNOSIS — I4892 Unspecified atrial flutter: Secondary | ICD-10-CM | POA: Diagnosis not present

## 2021-03-29 DIAGNOSIS — I10 Essential (primary) hypertension: Secondary | ICD-10-CM | POA: Insufficient documentation

## 2021-03-29 DIAGNOSIS — I483 Typical atrial flutter: Secondary | ICD-10-CM | POA: Diagnosis not present

## 2021-03-29 DIAGNOSIS — Z87891 Personal history of nicotine dependence: Secondary | ICD-10-CM | POA: Diagnosis not present

## 2021-03-29 DIAGNOSIS — R0602 Shortness of breath: Secondary | ICD-10-CM | POA: Insufficient documentation

## 2021-03-29 DIAGNOSIS — Z7901 Long term (current) use of anticoagulants: Secondary | ICD-10-CM | POA: Diagnosis not present

## 2021-03-29 DIAGNOSIS — Z791 Long term (current) use of non-steroidal anti-inflammatories (NSAID): Secondary | ICD-10-CM | POA: Insufficient documentation

## 2021-03-29 DIAGNOSIS — R5383 Other fatigue: Secondary | ICD-10-CM | POA: Insufficient documentation

## 2021-03-29 DIAGNOSIS — Z79899 Other long term (current) drug therapy: Secondary | ICD-10-CM | POA: Insufficient documentation

## 2021-03-29 LAB — BASIC METABOLIC PANEL
Anion gap: 6 (ref 5–15)
BUN: 17 mg/dL (ref 8–23)
CO2: 23 mmol/L (ref 22–32)
Calcium: 8.7 mg/dL — ABNORMAL LOW (ref 8.9–10.3)
Chloride: 107 mmol/L (ref 98–111)
Creatinine, Ser: 1.15 mg/dL (ref 0.61–1.24)
GFR, Estimated: >60 mL/min (ref >60)
Glucose, Bld: 136 mg/dL — ABNORMAL HIGH (ref 70–99)
Potassium: 4.2 mmol/L (ref 3.5–5.1)
Sodium: 136 mmol/L (ref 135–145)

## 2021-03-29 LAB — CBC
HCT: 41.6 % (ref 39.0–52.0)
Hemoglobin: 13.4 g/dL (ref 13.0–17.0)
MCH: 31.2 pg (ref 26.0–34.0)
MCHC: 32.2 g/dL (ref 30.0–36.0)
MCV: 97 fL (ref 80.0–100.0)
Platelets: 173 10*3/uL (ref 150–400)
RBC: 4.29 MIL/uL (ref 4.22–5.81)
RDW: 13.1 % (ref 11.5–15.5)
WBC: 4.6 10*3/uL (ref 4.0–10.5)
nRBC: 0 % (ref 0.0–0.2)

## 2021-03-29 NOTE — Progress Notes (Signed)
Primary Care Physician: Delilah Shan, MD Referring Physician: Dr. Radford Pax( DOD 03/14/21)  Cardiologist: Dr. Verlon Setting is a 80 y.o. male with a h/o CAD, s/p CAD, HTN, that recently saw Dr. Burt Knack for 2-3 months of fatigue and shortness of breath on exertion. He was set up for a stress test yesterday and  was found to have atrial flutter. He was referred here. Echo is pending in May. Stress test yesterday was determined indeterminate due to mildly decreased EF at 45-54%. EKG  today shows atrial flutter at 92 bpm, qrs int 90 ms, qtc 509 ms. Pt states that he feels good sitting but is symptomatic with exertion.   F/u 4/27. On last visit, he was placed on anticoagulation and BB was increased. He states that he feels better with less exertional dyspnea. He is better rate controlled today with v rates in the 70's. He is being compliant with eliquis, first full day of anticoagulation was 4/21. He is noting LLE.  Will rx diuretic. Will plan on cardioversion after 5/12.   F/u afib clinic, 03/29/21. He has been compliant with anticoagulation.Marland Kitchen He took lasix x 3 days when I last saw him and ankle edema is resolved.He had to go back on meloxicam as he could not get around without it 2/2 aching joints. He said that was worse than being out of rhythm.  He is at increased bleeding taking it but he will have to continue DOAC and QOL is poor with out taking NSAIDS. I was considering an cardioversion, but EKG's all reviewed with Dr. Lovena Le and he agrees typical flutter. If he has a typical flutter ablation then he could stop anticoagulation after 4 weeks and then will be lower risk from bleeding from NSAIDs.  Pt would prefer this approach.   Today, he denies symptoms of palpitations, chest pain, shortness of breath, orthopnea, PND, lower extremity edema, dizziness, presyncope, syncope, or neurologic sequela. The patient is tolerating medications without difficulties and is otherwise without complaint  today.   Past Medical History:  Diagnosis Date  . Abdominal hernia    pt. states he had it for 10 yrs or longer  . Abnormal findings on cardiac catheterization    ! Do NOT use RIGHT radial access on future caths or PCI.  Marland Kitchen Acute non-ST elevation myocardial infarction (NSTEMI) ([place]) 04/12/2014  . Acute non-ST segment elevation myocardial infarction ([place]) 04/12/2014  . Adiposity 04/14/2014  . AKI (acute kidney injury) ([place]) 04/14/2014  . Anginal pain ([place])   . Arthritis   . Back pain   . Benign essential hypertension 12/20/2016  . CAD (coronary artery disease)    a. NSTEMI 04/12/14 s/p DES x 2 to RCA. Prox 3.5x38 mm Xience Alpine DES, Dist 3.5x28 mm Xience DES; EF 65-70%. Staged PCI to the OM1 with 2.75 x 12 mm Resolute DES     . Chronic coronary artery disease 04/14/2014  . Chronic lower back pain 07/13/2014  . Dyspepsia 03/07/2016  . Dyspnea   . GERD (gastroesophageal reflux disease)   . H/O hiatal hernia   . Hyperlipidemia   . Injury of kidney 04/14/2014  . Muscle soreness 04/27/2014  . NSTEMI (non-ST elevated myocardial infarction) ([place]) 04/12/2014  . Obesity 04/14/2014  . Obstructive apnea 12/28/2014  . OSA on CPAP   . Osteoarthrosis 09/30/2014  . Primary insomnia 03/07/2016  . Pure hypercholesterolemia 12/28/2014  . SLEEP APNEA 02/24/2009   Qualifier: Diagnosis of  By: Annamaria Boots MD, Kasandra Knudsen  Past Surgical History:  Procedure Laterality Date  . APPENDECTOMY     Remote  . CARPAL TUNNEL RELEASE Bilateral   . CORONARY ANGIOPLASTY  04/12/14   STENT TO RCA  . CORONARY ARTERY BYPASS GRAFT N/A 06/06/2018   Procedure: CORONARY ARTERY BYPASS GRAFTING (CABG) x 3 WITH ENDOSCOPIC HARVESTING OF RIGHT SAPHENOUS VEIN;  Surgeon: Ivin Poot, MD;  Location: [place];  Service: Open Heart Surgery;  Laterality: N/A;  . KNEE ARTHROSCOPY Bilateral   . LEFT HEART CATH AND CORONARY ANGIOGRAPHY N/A 05/30/2018   Procedure: LEFT HEART CATH AND CORONARY ANGIOGRAPHY;  Surgeon: Sherren Mocha, MD;  Location: [place] CV LAB;  Service: Cardiovascular;  Laterality: N/A;  . LEFT HEART CATHETERIZATION WITH CORONARY ANGIOGRAM N/A 04/12/2014   Procedure: LEFT HEART CATHETERIZATION WITH CORONARY ANGIOGRAM;  Surgeon: Blane Ohara, MD;  [facility] C[facility] LAB;  Service: Cardiovascular;  Laterality: N/A;  . PERCUTANEOUS CORONARY STENT INTERVENTION (PCI-S)  04/12/2014   Procedure: PERCUTANEOUS CORONARY STENT INTERVENTION (PCI-S);  Surgeon: Blane Ohara, MD;  Location: [facility] CATH LAB;  Service: Cardiovascular;;  . PERCUTANEOUS CORONARY STENT INTERVENTION (PCI-S) N/A 04/14/2014   Procedure: PERCUTANEOUS CORONARY STENT INTERVENTION (PCI-S);  Surgeon: Peter M Martinique, MD;  Location: [facility] CATH LAB;  Service: Cardiovascular;  Laterality: N/A;  . TEE WITHOUT CARDIOVERSION N/A 06/06/2018   Procedure: TRANSESOPHAGEAL ECHOCARDIOGRAM (TEE);  Surgeon: Prescott Gum, Collier Salina, MD;  Location: [place];  Service: Open Heart Surgery;  Laterality: N/A;    Current Outpatient Medications  Medication Sig Dispense Refill  . amLODipine (NORVASC) 5 MG tablet Take 0.5 tablets by mouth daily.    Marland Kitchen apixaban (ELIQUIS) 5 MG TABS tablet Take 1 tablet (5 mg total) by mouth 2 (two) times daily. 60 tablet 3  . atorvastatin (LIPITOR) 40 MG tablet Take 1 tablet (40 mg total) by mouth daily at 6 PM. 90 tablet 3  . furosemide (LASIX) 20 MG tablet Take 1 tablet by mouth daily for 3 days then only as needed for swelling 10 tablet 0  . HYDROcodone-acetaminophen (NORCO/VICODIN) 5-325 MG per tablet Take 1 tablet by mouth 3 (three) times daily as needed for moderate pain.     . meloxicam (MOBIC) 7.5 MG tablet Take 7.5 mg by mouth daily.    . metoprolol succinate (TOPROL-XL) 50 MG 24 hr tablet Take 1 tablet in the AM and 1/2 tablet in the PM 90 tablet 3  . pantoprazole (PROTONIX) 40 MG tablet Take 1 tablet (40 mg total) by mouth daily. 90 tablet 3  . traMADol (ULTRAM) 50 MG tablet Take 2 tablets (100 mg total) by mouth 2 (two) times daily. 60 tablet 2   No  current facility-administered medications for this encounter.    Allergies  Allergen Reactions  . Antihistamines, Diphenhydramine-Type     Other reaction(s): shakey    Social History   Socioeconomic History  . Marital status: Married    Spouse name: Not on file  . Number of children: Not on file  . Years of education: Not on file  . Highest education level: Not on file  Occupational History  . Not on file  Tobacco Use  . Smoking status: Former Smoker    Packs/day: 1.00    Years: 20.00    Pack years: 20.00    Types: Cigarettes    Quit date: 11/26/1978    Years since quitting: 42.3  . Smokeless tobacco: Never Used  Substance and Sexual Activity  . Alcohol use: No  . Drug use: No  .  Sexual activity: Not on file  Other Topics Concern  . Not on file  Social History Narrative  . Not on file   Social Determinants of Health   Financial Resource Strain: Not on file  Food Insecurity: Not on file  Transportation Needs: Not on file  Physical Activity: Not on file  Stress: Not on file  Social Connections: Not on file  Intimate Partner Violence: Not on file    Family History  Problem Relation Age of Onset  . Coronary artery disease Mother 1  . Emphysema Father        coal miner    ROS- All systems are reviewed and negative except as per the HPI above  Physical Exam: Vitals:   03/29/21 1059  BP: 114/64  Pulse: 67  Weight: 119.1 kg  Height: 5\' 11"  (1.803 m)   Wt Readings from Last 3 Encounters:  03/29/21 119.1 kg  03/22/21 119 kg  03/15/21 123.1 kg    Labs: Lab Results  Component Value Date   NA 136 03/29/2021   K 4.2 03/29/2021   CL 107 03/29/2021   CO2 23 03/29/2021   GLUCOSE 136 (H) 03/29/2021   BUN 17 03/29/2021   CREATININE 1.15 03/29/2021   CALCIUM 8.7 (L) 03/29/2021   MG 2.1 06/11/2018   Lab Results  Component Value Date   INR 1.21 06/06/2018   Lab Results  Component Value Date   CHOL 140 06/15/2020   HDL 45 06/15/2020   LDLCALC 68  06/15/2020   TRIG 156 (H) 06/15/2020     GEN- The patient is well appearing, alert and oriented x 3 today.   Head- normocephalic, atraumatic Eyes-  Sclera clear, conjunctiva pink Ears- hearing intact Oropharynx- clear Neck- supple, no JVP Lymph- no cervical lymphadenopathy Lungs- Clear to ausculation bilaterally, normal work of breathing Heart- irregular rate and rhythm, no murmurs, rubs or gallops, PMI not laterally displaced GI- soft, NT, ND, + BS Extremities- no clubbing, cyanosis, or edema, 1+ LLE MS- no significant deformity or atrophy Skin- no rash or lesion Psych- euthymic mood, full affect Neuro- strength and sensation are intact  EKG-  appears to be typical flutter at 67 bpm, qrs int 86 ms, qtc 462 ms, compared to last weeks EKG  EKG's all reviewed by Dr. Lovena Le and agee all typical; flutter    Lexi myoview- 03/14/21- The left ventricular ejection fraction is mildly decreased (45-54%) though imaging performed in atrial flutter.  Nuclear stress EF: 46%.  There was no ST segment deviation noted during stress.  No T wave inversion was noted during stress.  This is an intermediate risk study in the setting of decrease LVEF   IMPRESSIONS Atrial Flutter during exam. Sensitivity for accurate LVEF is decreased in the setting of arrhythmia.  RECOMMENDATIONS/CONCLUSIONS Stress test is negative for ischemic or infarction. The study is at most an intermediate risk study because of LVEF as above. Compared to 2018 study, no change in perfusion.   Assessment and Plan: 1. New onset atrial flutter ( Typical)  General education re afib/flutter  This may be the reason for his symptoms of fatigue and shortness of breath with  activity for the last 2-3 months  He feels improved on the higher dose of BB, with better rate control, continue  on metoprolol succinate 50 mg am and 1/2 tab at hs  Will plan  on typical a flutter ablation so that pt can get off anticoagulation to lower  risk of bleeding with daily meloxicam  2. CHA2DS2VASc score of 4 Continue eliquis 5 mg bid Has not missed any doses since start 4/21   3. LLE Rx'ed lasix 20 mg daily x 3 days then as needed for LLE, weight gain of 3-5 lbs  Resolved after 3 days of diuretic   To Dr. Lovena Le to discuss typical atrial flutter ablation     Butch Penny C. Lynnley Doddridge, [facility] [address] [place], [place] [address] [phone]

## 2021-03-29 NOTE — Patient Instructions (Signed)
Cardioversion scheduled for Friday, May 13th  - Arrive at the [facility] and go to admitting at [facility] not eat or drink anything after midnight the night prior to your procedure.  - Take all your morning medication (except diabetic medications) with a sip of water prior to arrival.  - You will not be able to drive home after your procedure.  - Do NOT miss any doses of your blood thinner - if you should miss a dose please notify our office immediately.  - If you feel as if you go back into normal rhythm prior to scheduled cardioversion, please notify our office immediately. If your procedure is canceled in the cardioversion suite you will be charged a cancellation fee.   Day of cardioversion return to normal dosing of metoprolol

## 2021-03-30 ENCOUNTER — Ambulatory Visit: Payer: Medicare HMO | Admitting: Internal Medicine

## 2021-03-30 ENCOUNTER — Encounter: Payer: Self-pay | Admitting: Internal Medicine

## 2021-03-30 DIAGNOSIS — I4892 Unspecified atrial flutter: Secondary | ICD-10-CM | POA: Insufficient documentation

## 2021-03-30 NOTE — H&P (View-Only) (Signed)
    HPI Allen James is referred today by Donna Carroll for evaluation of atrial flutter. He has a h/o HTN, and has had atrial flutter for several weeks and has been on systemic anti-coagulation. Since he has been in atrial flutter, he notes worsening swelling in his lower extremities. He also has had worsening dyspnea.  Allergies  Allergen Reactions  . Antihistamines, Diphenhydramine-Type     Other reaction(s): shakey     Current Outpatient Medications  Medication Sig Dispense Refill  . amLODipine (NORVASC) 5 MG tablet Take 0.5 tablets by mouth daily.    . apixaban (ELIQUIS) 5 MG TABS tablet Take 1 tablet (5 mg total) by mouth 2 (two) times daily. 60 tablet 3  . atorvastatin (LIPITOR) 40 MG tablet Take 1 tablet (40 mg total) by mouth daily at 6 PM. 90 tablet 3  . furosemide (LASIX) 20 MG tablet Take 1 tablet by mouth daily for 3 days then only as needed for swelling 10 tablet 0  . HYDROcodone-acetaminophen (NORCO/VICODIN) 5-325 MG per tablet Take 1 tablet by mouth 3 (three) times daily as needed for moderate pain.     . meloxicam (MOBIC) 7.5 MG tablet Take 7.5 mg by mouth daily.    . metoprolol succinate (TOPROL-XL) 50 MG 24 hr tablet Take 1 tablet in the AM and 1/2 tablet in the PM 90 tablet 3  . traMADol (ULTRAM) 50 MG tablet Take 2 tablets (100 mg total) by mouth 2 (two) times daily. 60 tablet 2  . pantoprazole (PROTONIX) 40 MG tablet Take 1 tablet (40 mg total) by mouth daily. 90 tablet 3   No current facility-administered medications for this visit.     Past Medical History:  Diagnosis Date  . Abdominal hernia    pt. states he had it for 10 yrs or longer  . Abnormal findings on cardiac catheterization    ! Do NOT use RIGHT radial access on future caths or PCI.  . Acute non-ST elevation myocardial infarction (NSTEMI) ([facility]) 04/12/2014  . Acute non-ST segment elevation myocardial infarction ([facility]) 04/12/2014  . Adiposity 04/14/2014  . AKI (acute kidney injury) ([facility]) 04/14/2014   . Anginal pain ([facility])   . Arthritis   . Back pain   . Benign essential hypertension 12/20/2016  . CAD (coronary artery disease)    a. NSTEMI 04/12/14 s/p DES x 2 to RCA. Prox 3.5x38 mm Xience Alpine DES, Dist 3.5x28 mm Xience DES; EF 65-70%. Staged PCI to the OM1 with 2.75 x 12 mm Resolute DES     . Chronic coronary artery disease 04/14/2014  . Chronic lower back pain 07/13/2014  . Dyspepsia 03/07/2016  . Dyspnea   . GERD (gastroesophageal reflux disease)   . H/O hiatal hernia   . Hyperlipidemia   . Injury of kidney 04/14/2014  . Muscle soreness 04/27/2014  . NSTEMI (non-ST elevated myocardial infarction) ([facility]) 04/12/2014  . Obesity 04/14/2014  . Obstructive apnea 12/28/2014  . OSA on CPAP   . Osteoarthrosis 09/30/2014  . Primary insomnia 03/07/2016  . Pure hypercholesterolemia 12/28/2014  . SLEEP APNEA 02/24/2009   Qualifier: Diagnosis of  By: Young MD, Clinton D     ROS:   All systems reviewed and negative except as noted in the HPI.   Past Surgical History:  Procedure Laterality Date  . APPENDECTOMY     Remote  . CARPAL TUNNEL RELEASE Bilateral   . CORONARY ANGIOPLASTY  04/12/14   STENT TO RCA  . CORONARY ARTERY BYPASS GRAFT N/A 06/06/2018     Procedure: CORONARY ARTERY BYPASS GRAFTING (CABG) x 3 WITH ENDOSCOPIC HARVESTING OF RIGHT SAPHENOUS VEIN;  Surgeon: Ivin Poot, MD;  Location: [place];  Service: Open Heart Surgery;  Laterality: N/A;  . KNEE ARTHROSCOPY Bilateral   . LEFT HEART CATH AND CORONARY ANGIOGRAPHY N/A 05/30/2018   Procedure: LEFT HEART CATH AND CORONARY ANGIOGRAPHY;  Surgeon: Sherren Mocha, MD;  Location: [place] CV LAB;  Service: Cardiovascular;  Laterality: N/A;  . LEFT HEART CATHETERIZATION WITH CORONARY ANGIOGRAM N/A 04/12/2014   Procedure: LEFT HEART CATHETERIZATION WITH CORONARY ANGIOGRAM;  Surgeon: Blane Ohara, MD;  Location[facility] CATH LAB;  Service: Cardiovascular;  Laterality: N/A;  . PERCUTANEOUS CORONARY STENT INTERVENTION (PCI-S)  04/12/2014    Procedure: PERCUTANEOUS CORONARY STENT INTERVENTION (PCI-S);  Surgeon: Blane Ohara, MD;  Location: [facility] CATH LAB;  Service: Cardiovascular;;  . PERCUTANEOUS CORONARY STENT INTERVENTION (PCI-S) N/A 04/14/2014   Procedure: PERCUTANEOUS CORONARY STENT INTERVENTION (PCI-S);  Surgeon: Peter M Martinique, MD;  Location: [facility] CATH LAB;  Service: Cardiovascular;  Laterality: N/A;  . TEE WITHOUT CARDIOVERSION N/A 06/06/2018   Procedure: TRANSESOPHAGEAL ECHOCARDIOGRAM (TEE);  Surgeon: Prescott Gum, Collier Salina, MD;  Location: [place];  Service: Open Heart Surgery;  Laterality: N/A;     Family History  Problem Relation Age of Onset  . Coronary artery disease Mother 65  . Emphysema Father        Ecologist     Social History   Socioeconomic History  . Marital status: Married    Spouse name: Not on file  . Number of children: Not on file  . Years of education: Not on file  . Highest education level: Not on file  Occupational History  . Not on file  Tobacco Use  . Smoking status: Former Smoker    Packs/day: 1.00    Years: 20.00    Pack years: 20.00    Types: Cigarettes    Quit date: 11/26/1978    Years since quitting: 42.3  . Smokeless tobacco: Never Used  Substance and Sexual Activity  . Alcohol use: No  . Drug use: No  . Sexual activity: Not on file  Other Topics Concern  . Not on file  Social History Narrative  . Not on file   Social Determinants of Health   Financial Resource Strain: Not on file  Food Insecurity: Not on file  Transportation Needs: Not on file  Physical Activity: Not on file  Stress: Not on file  Social Connections: Not on file  Intimate Partner Violence: Not on file     BP 110/64   Pulse 60   Ht 5\' 11"  (1.803 m)   Wt 264 lb (119.7 kg)   SpO2 97%   BMI 36.82 kg/m   Physical Exam:  Well appearing NAD HEENT: Unremarkable Neck:  No JVD, no thyromegally Lymphatics:  No adenopathy Back:  No CVA tenderness Lungs:  Clear with no wheezes HEART:  Regular rate  rhythm, no murmurs, no rubs, no clicks Abd:  soft, positive bowel sounds, no organomegally, no rebound, no guarding Ext:  2 plus pulses, no edema, no cyanosis, no clubbing Skin:  No rashes no nodules Neuro:  CN II through XII intact, motor grossly intact  EKG - reviewed. atrial flutter with a CVR   Assess/Plan: 1. Atrial flutter - he does not have palpitations but is symptomatic. I have discussed the indications/risks/benefits/goals/expectations of catheter ablation and he wishes to proceed.  2. Diastolic heart failure - his EF is down a bit but I  suspect that this is related to his being out of rhythm. We will check a 2D echo after he is back in rhythm.  Lynnox Girten, MD 2. Coags  3. Diastolic heart failure 4. Dyslipidemia 

## 2021-03-30 NOTE — Patient Instructions (Addendum)
Medication Instructions:  Your physician recommends that you continue on your current medications as directed. Please refer to the Current Medication list given to you today.  Labwork: None ordered.  Testing/Procedures: None ordered.  Follow-Up:  SEE INSTRUCTION LETTER  Any Other Special Instructions Will Be Listed Below (If Applicable).  If you need a refill on your cardiac medications before your next appointment, please call your pharmacy.     Cardiac electrophysiology: From cell to bedside (7th ed., pp. 1239-1252). [place], [place]: Elsevier.">  Cardiac Ablation Cardiac ablation is a procedure to destroy, or ablate, a small amount of heart tissue in very specific places. The heart has many electrical connections. Sometimes these connections are abnormal and can cause the heart to beat very fast or irregularly. Ablating some of the areas that cause problems can improve the heart's rhythm or return it to normal. Ablation may be done for people who:  Have Wolff-Parkinson-White syndrome.  Have fast heart rhythms (tachycardia).  Have taken medicines for an abnormal heart rhythm (arrhythmia) that were not effective or caused side effects.  Have a high-risk heartbeat that may be life-threatening. During the procedure, a small incision is made in the neck or the groin, and a long, thin tube (catheter) is inserted into the incision and moved to the heart. Small devices (electrodes) on the tip of the catheter will send out electrical currents. A type of X-ray (fluoroscopy) will be used to help guide the catheter and to provide images of the heart. Tell a health care provider about:  Any allergies you have.  All medicines you are taking, including vitamins, herbs, eye drops, creams, and over-the-counter medicines.  Any problems you or family members have had with anesthetic medicines.  Any blood disorders you have.  Any surgeries you have had.  Any medical conditions you have,  such as kidney failure.  Whether you are pregnant or may be pregnant. What are the risks? Generally, this is a safe procedure. However, problems may occur, including:  Infection.  Bruising and bleeding at the catheter insertion site.  Bleeding into the chest, especially into the sac that surrounds the heart. This is a serious complication.  Stroke or blood clots.  Damage to nearby structures or organs.  Allergic reaction to medicines or dyes.  Need for a permanent pacemaker if the normal electrical system is damaged. A pacemaker is a small computer that sends electrical signals to the heart and helps your heart beat normally.  The procedure not being fully effective. This may not be recognized until months later. Repeat ablation procedures are sometimes done. What happens before the procedure? Medicines Ask your health care provider about:  Changing or stopping your regular medicines. This is especially important if you are taking diabetes medicines or blood thinners.  Taking medicines such as aspirin and ibuprofen. These medicines can thin your blood. Do not take these medicines unless your health care provider tells you to take them.  Taking over-the-counter medicines, vitamins, herbs, and supplements. General instructions  Follow instructions from your health care provider about eating or drinking restrictions.  Plan to have someone take you home from the hospital or clinic.  If you will be going home right after the procedure, plan to have someone with you for 24 hours.  Ask your health care provider what steps will be taken to prevent infection. What happens during the procedure?  An IV will be inserted into one of your veins.  You will be given a medicine to help you relax (  sedative).  The skin on your neck or groin will be numbed.  An incision will be made in your neck or your groin.  A needle will be inserted through the incision and into a large vein in your  neck or groin.  A catheter will be inserted into the needle and moved to your heart.  Dye may be injected through the catheter to help your surgeon see the area of the heart that needs treatment.  Electrical currents will be sent from the catheter to ablate heart tissue in desired areas. There are three types of energy that may be used to do this: ? Heat (radiofrequency energy). ? Laser energy. ? Extreme cold (cryoablation).  When the tissue has been ablated, the catheter will be removed.  Pressure will be held on the insertion area to prevent a lot of bleeding.  A bandage (dressing) will be placed over the insertion area. The exact procedure may vary among health care providers and hospitals.   What happens after the procedure?  Your blood pressure, heart rate, breathing rate, and blood oxygen level will be monitored until you leave the hospital or clinic.  Your insertion area will be monitored for bleeding. You will need to lie still for a few hours to ensure that you do not bleed from the insertion area.  Do not drive for 24 hours or as long as told by your health care provider. Summary  Cardiac ablation is a procedure to destroy, or ablate, a small amount of heart tissue using an electrical current. This procedure can improve the heart rhythm or return it to normal.  Tell your health care provider about any medical conditions you may have and all medicines you are taking to treat them.  This is a safe procedure, but problems may occur. Problems may include infection, bruising, damage to nearby organs or structures, or allergic reactions to medicines.  Follow your health care provider's instructions about eating and drinking before the procedure. You may also be told to change or stop some of your medicines.  After the procedure, do not drive for 24 hours or as long as told by your health care provider. This information is not intended to replace advice given to you by your  health care provider. Make sure you discuss any questions you have with your health care provider. Document Revised: 09/21/2019 Document Reviewed: 09/21/2019 Elsevier Patient Education  2021 Elsevier Inc.      

## 2021-03-30 NOTE — Progress Notes (Signed)
HPI Allen James is referred today by Roderic Palau for evaluation of atrial flutter. He has a h/o HTN, and has had atrial flutter for several weeks and has been on systemic anti-coagulation. Since he has been in atrial flutter, he notes worsening swelling in his lower extremities. He also has had worsening dyspnea.  Allergies  Allergen Reactions  . Antihistamines, Diphenhydramine-Type     Other reaction(s): shakey     Current Outpatient Medications  Medication Sig Dispense Refill  . amLODipine (NORVASC) 5 MG tablet Take 0.5 tablets by mouth daily.    Marland Kitchen apixaban (ELIQUIS) 5 MG TABS tablet Take 1 tablet (5 mg total) by mouth 2 (two) times daily. 60 tablet 3  . atorvastatin (LIPITOR) 40 MG tablet Take 1 tablet (40 mg total) by mouth daily at 6 PM. 90 tablet 3  . furosemide (LASIX) 20 MG tablet Take 1 tablet by mouth daily for 3 days then only as needed for swelling 10 tablet 0  . HYDROcodone-acetaminophen (NORCO/VICODIN) 5-325 MG per tablet Take 1 tablet by mouth 3 (three) times daily as needed for moderate pain.     . meloxicam (MOBIC) 7.5 MG tablet Take 7.5 mg by mouth daily.    . metoprolol succinate (TOPROL-XL) 50 MG 24 hr tablet Take 1 tablet in the AM and 1/2 tablet in the PM 90 tablet 3  . traMADol (ULTRAM) 50 MG tablet Take 2 tablets (100 mg total) by mouth 2 (two) times daily. 60 tablet 2  . pantoprazole (PROTONIX) 40 MG tablet Take 1 tablet (40 mg total) by mouth daily. 90 tablet 3   No current facility-administered medications for this visit.     Past Medical History:  Diagnosis Date  . Abdominal hernia    pt. states he had it for 10 yrs or longer  . Abnormal findings on cardiac catheterization    ! Do NOT use RIGHT radial access on future caths or PCI.  Marland Kitchen Acute non-ST elevation myocardial infarction (NSTEMI) ([place]) 04/12/2014  . Acute non-ST segment elevation myocardial infarction ([place]) 04/12/2014  . Adiposity 04/14/2014  . AKI (acute kidney injury) ([place]) 04/14/2014   . Anginal pain ([place])   . Arthritis   . Back pain   . Benign essential hypertension 12/20/2016  . CAD (coronary artery disease)    a. NSTEMI 04/12/14 s/p DES x 2 to RCA. Prox 3.5x38 mm Xience Alpine DES, Dist 3.5x28 mm Xience DES; EF 65-70%. Staged PCI to the OM1 with 2.75 x 12 mm Resolute DES     . Chronic coronary artery disease 04/14/2014  . Chronic lower back pain 07/13/2014  . Dyspepsia 03/07/2016  . Dyspnea   . GERD (gastroesophageal reflux disease)   . H/O hiatal hernia   . Hyperlipidemia   . Injury of kidney 04/14/2014  . Muscle soreness 04/27/2014  . NSTEMI (non-ST elevated myocardial infarction) ([place]) 04/12/2014  . Obesity 04/14/2014  . Obstructive apnea 12/28/2014  . OSA on CPAP   . Osteoarthrosis 09/30/2014  . Primary insomnia 03/07/2016  . Pure hypercholesterolemia 12/28/2014  . SLEEP APNEA 02/24/2009   Qualifier: Diagnosis of  By: Annamaria Boots MD, Clinton D     ROS:   All systems reviewed and negative except as noted in the HPI.   Past Surgical History:  Procedure Laterality Date  . APPENDECTOMY     Remote  . CARPAL TUNNEL RELEASE Bilateral   . CORONARY ANGIOPLASTY  04/12/14   STENT TO RCA  . CORONARY ARTERY BYPASS GRAFT N/A 06/06/2018  Procedure: CORONARY ARTERY BYPASS GRAFTING (CABG) x 3 WITH ENDOSCOPIC HARVESTING OF RIGHT SAPHENOUS VEIN;  Surgeon: Ivin Poot, MD;  Location: [place];  Service: Open Heart Surgery;  Laterality: N/A;  . KNEE ARTHROSCOPY Bilateral   . LEFT HEART CATH AND CORONARY ANGIOGRAPHY N/A 05/30/2018   Procedure: LEFT HEART CATH AND CORONARY ANGIOGRAPHY;  Surgeon: Sherren Mocha, MD;  Location: [facility];  Service: Cardiovascular;  Laterality: N/A;  . LEFT HEART CATHETERIZATION WITH CORONARY ANGIOGRAM N/A 04/12/2014   Procedure: LEFT HEART CATHETERIZATION WITH CORONARY ANGIOGRAM;  Surgeon: Blane Ohara, MD;  Location: [facility] CATH LAB;  Service: Cardiovascular;  Laterality: N/A;  . PERCUTANEOUS CORONARY STENT INTERVENTION (PCI-S)  04/12/2014    Procedure: PERCUTANEOUS CORONARY STENT INTERVENTION (PCI-S);  Surgeon: Blane Ohara, MD;  Location: [facility] CATH LAB;  Service: Cardiovascular;;  . PERCUTANEOUS CORONARY STENT INTERVENTION (PCI-S) N/A 04/14/2014   Procedure: PERCUTANEOUS CORONARY STENT INTERVENTION (PCI-S);  Surgeon: Peter M Martinique, MD;  Location: [facility] CATH LAB;  Service: Cardiovascular;  Laterality: N/A;  . TEE WITHOUT CARDIOVERSION N/A 06/06/2018   Procedure: TRANSESOPHAGEAL ECHOCARDIOGRAM (TEE);  Surgeon: Prescott Gum, Collier Salina, MD;  Location: [place];  Service: Open Heart Surgery;  Laterality: N/A;     Family History  Problem Relation Age of Onset  . Coronary artery disease Mother 65  . Emphysema Father        Ecologist     Social History   Socioeconomic History  . Marital status: Married    Spouse name: Not on file  . Number of children: Not on file  . Years of education: Not on file  . Highest education level: Not on file  Occupational History  . Not on file  Tobacco Use  . Smoking status: Former Smoker    Packs/day: 1.00    Years: 20.00    Pack years: 20.00    Types: Cigarettes    Quit date: 11/26/1978    Years since quitting: 42.3  . Smokeless tobacco: Never Used  Substance and Sexual Activity  . Alcohol use: No  . Drug use: No  . Sexual activity: Not on file  Other Topics Concern  . Not on file  Social History Narrative  . Not on file   Social Determinants of Health   Financial Resource Strain: Not on file  Food Insecurity: Not on file  Transportation Needs: Not on file  Physical Activity: Not on file  Stress: Not on file  Social Connections: Not on file  Intimate Partner Violence: Not on file     BP 110/64   Pulse 60   Ht 5\' 11"  (1.803 m)   Wt 264 lb (119.7 kg)   SpO2 97%   BMI 36.82 kg/m   Physical Exam:  Well appearing NAD HEENT: Unremarkable Neck:  No JVD, no thyromegally Lymphatics:  No adenopathy Back:  No CVA tenderness Lungs:  Clear with no wheezes HEART:  Regular rate  rhythm, no murmurs, no rubs, no clicks Abd:  soft, positive bowel sounds, no organomegally, no rebound, no guarding Ext:  2 plus pulses, no edema, no cyanosis, no clubbing Skin:  No rashes no nodules Neuro:  CN II through XII intact, motor grossly intact  EKG - reviewed. atrial flutter with a CVR   Assess/Plan: 1. Atrial flutter - he does not have palpitations but is symptomatic. I have discussed the indications/risks/benefits/goals/expectations of catheter ablation and he wishes to proceed.  2. Diastolic heart failure - his EF is down a bit but I  suspect that this is related to his being out of rhythm. We will check a 2D echo after he is back in rhythm.  [name], MD 2. Coags  3. Diastolic heart failure 4. Dyslipidemia

## 2021-04-05 ENCOUNTER — Other Ambulatory Visit (HOSPITAL_COMMUNITY): Payer: Medicare HMO

## 2021-04-07 ENCOUNTER — Ambulatory Visit (HOSPITAL_COMMUNITY): Admit: 2021-04-07 | Payer: Medicare HMO | Admitting: Internal Medicine

## 2021-04-07 ENCOUNTER — Encounter (HOSPITAL_COMMUNITY): Payer: Self-pay

## 2021-04-07 SURGERY — CARDIOVERSION
Anesthesia: Monitor Anesthesia Care

## 2021-04-10 NOTE — Addendum Note (Signed)
Encounter addended by: Isidor Holts, [facility] on: 04/10/2021 2:32 PM  Actions taken: Order Reconciliation Section accessed

## 2021-04-14 NOTE — Pre-Procedure Instructions (Signed)
Instructed patient on the following items: Arrival time 0830 Nothing to eat or drink after midnight No meds AM of procedure Responsible person to drive you home and stay with you for 24 hrs  Have you missed any doses of anti-coagulant Eliquis- hasn't missed any doses   

## 2021-04-17 ENCOUNTER — Ambulatory Visit (HOSPITAL_COMMUNITY): Payer: Medicare HMO | Admitting: Certified Registered Nurse Anesthetist

## 2021-04-17 ENCOUNTER — Other Ambulatory Visit (HOSPITAL_COMMUNITY): Payer: Medicare HMO

## 2021-04-17 ENCOUNTER — Ambulatory Visit (HOSPITAL_COMMUNITY)
Admission: RE | Admit: 2021-04-17 | Discharge: 2021-04-17 | Disposition: A | Discharge status: Home / Self Care | Payer: Medicare HMO | Attending: Internal Medicine | Admitting: Internal Medicine

## 2021-04-17 ENCOUNTER — Other Ambulatory Visit: Payer: Self-pay

## 2021-04-17 ENCOUNTER — Encounter (HOSPITAL_COMMUNITY)
Admission: RE | Disposition: A | Discharge status: Home / Self Care | Payer: Self-pay | Source: Home / Self Care | Attending: Internal Medicine

## 2021-04-17 ENCOUNTER — Ambulatory Visit (HOSPITAL_COMMUNITY): Admit: 2021-04-17 | Payer: Medicare HMO | Admitting: Internal Medicine

## 2021-04-17 DIAGNOSIS — Z87891 Personal history of nicotine dependence: Secondary | ICD-10-CM | POA: Insufficient documentation

## 2021-04-17 DIAGNOSIS — Z7901 Long term (current) use of anticoagulants: Secondary | ICD-10-CM | POA: Diagnosis not present

## 2021-04-17 DIAGNOSIS — I252 Old myocardial infarction: Secondary | ICD-10-CM | POA: Diagnosis not present

## 2021-04-17 DIAGNOSIS — Z951 Presence of aortocoronary bypass graft: Secondary | ICD-10-CM | POA: Diagnosis not present

## 2021-04-17 DIAGNOSIS — E7800 Pure hypercholesterolemia, unspecified: Secondary | ICD-10-CM | POA: Insufficient documentation

## 2021-04-17 DIAGNOSIS — N179 Acute kidney failure, unspecified: Secondary | ICD-10-CM | POA: Insufficient documentation

## 2021-04-17 DIAGNOSIS — E785 Hyperlipidemia, unspecified: Secondary | ICD-10-CM | POA: Insufficient documentation

## 2021-04-17 DIAGNOSIS — Z955 Presence of coronary angioplasty implant and graft: Secondary | ICD-10-CM | POA: Diagnosis not present

## 2021-04-17 DIAGNOSIS — Z888 Allergy status to other drugs, medicaments and biological substances status: Secondary | ICD-10-CM | POA: Insufficient documentation

## 2021-04-17 DIAGNOSIS — I4892 Unspecified atrial flutter: Secondary | ICD-10-CM | POA: Diagnosis not present

## 2021-04-17 DIAGNOSIS — I5030 Unspecified diastolic (congestive) heart failure: Secondary | ICD-10-CM | POA: Diagnosis not present

## 2021-04-17 DIAGNOSIS — Z6836 Body mass index (BMI) 36.0-36.9, adult: Secondary | ICD-10-CM | POA: Diagnosis not present

## 2021-04-17 DIAGNOSIS — Z79899 Other long term (current) drug therapy: Secondary | ICD-10-CM | POA: Diagnosis not present

## 2021-04-17 DIAGNOSIS — I483 Typical atrial flutter: Secondary | ICD-10-CM | POA: Insufficient documentation

## 2021-04-17 DIAGNOSIS — Z9989 Dependence on other enabling machines and devices: Secondary | ICD-10-CM | POA: Diagnosis not present

## 2021-04-17 DIAGNOSIS — Z9049 Acquired absence of other specified parts of digestive tract: Secondary | ICD-10-CM | POA: Insufficient documentation

## 2021-04-17 DIAGNOSIS — Z791 Long term (current) use of non-steroidal anti-inflammatories (NSAID): Secondary | ICD-10-CM | POA: Diagnosis not present

## 2021-04-17 DIAGNOSIS — E669 Obesity, unspecified: Secondary | ICD-10-CM | POA: Diagnosis not present

## 2021-04-17 DIAGNOSIS — I2510 Atherosclerotic heart disease of native coronary artery without angina pectoris: Secondary | ICD-10-CM | POA: Insufficient documentation

## 2021-04-17 DIAGNOSIS — Z8249 Family history of ischemic heart disease and other diseases of the circulatory system: Secondary | ICD-10-CM | POA: Insufficient documentation

## 2021-04-17 DIAGNOSIS — G4733 Obstructive sleep apnea (adult) (pediatric): Secondary | ICD-10-CM | POA: Insufficient documentation

## 2021-04-17 DIAGNOSIS — I110 Hypertensive heart disease with heart failure: Secondary | ICD-10-CM | POA: Insufficient documentation

## 2021-04-17 HISTORY — PX: A-FLUTTER ABLATION: EP1230

## 2021-04-17 SURGERY — A-FLUTTER ABLATION

## 2021-04-17 SURGERY — A-FLUTTER ABLATION
Anesthesia: General

## 2021-04-17 MED ORDER — HEPARIN SODIUM (PORCINE) 1000 UNIT/ML IJ SOLN
INTRAMUSCULAR | Status: AC
  Filled 2021-04-17: qty 1

## 2021-04-17 MED ORDER — BUPIVACAINE HCL (PF) 0.25 % IJ SOLN
INTRAMUSCULAR | Status: AC
  Filled 2021-04-17: qty 30

## 2021-04-17 MED ORDER — PHENYLEPHRINE 40 MCG/ML (10ML) SYRINGE FOR IV PUSH (FOR BLOOD PRESSURE SUPPORT)
PREFILLED_SYRINGE | INTRAVENOUS | Status: DC | PRN
  Administered 2021-04-17: 120 ug via INTRAVENOUS
  Administered 2021-04-17: 80 ug via INTRAVENOUS
  Administered 2021-04-17: 160 ug via INTRAVENOUS
  Administered 2021-04-17: 80 ug via INTRAVENOUS

## 2021-04-17 MED ORDER — PROPOFOL 10 MG/ML IV BOLUS
INTRAVENOUS | Status: DC | PRN
  Administered 2021-04-17: 100 mg via INTRAVENOUS

## 2021-04-17 MED ORDER — LIDOCAINE 2% (20 MG/ML) 5 ML SYRINGE
INTRAMUSCULAR | Status: DC | PRN
  Administered 2021-04-17: 100 mg via INTRAVENOUS

## 2021-04-17 MED ORDER — ACETAMINOPHEN 325 MG PO TABS
650.0000 mg | ORAL_TABLET | ORAL | Status: DC | PRN
  Filled 2021-04-17: qty 2

## 2021-04-17 MED ORDER — ONDANSETRON HCL 4 MG/2ML IJ SOLN
INTRAMUSCULAR | Status: DC | PRN
  Administered 2021-04-17: 4 mg via INTRAVENOUS

## 2021-04-17 MED ORDER — BUPIVACAINE HCL (PF) 0.25 % IJ SOLN
INTRAMUSCULAR | Status: DC | PRN
  Administered 2021-04-17: 30 mL

## 2021-04-17 MED ORDER — SODIUM CHLORIDE 0.9% FLUSH: 3.0000 mL | Freq: Two times a day (BID) | INTRAVENOUS | Status: DC

## 2021-04-17 MED ORDER — ONDANSETRON HCL 4 MG/2ML IJ SOLN: 4.0000 mg | Freq: Four times a day (QID) | INTRAMUSCULAR | Status: DC | PRN

## 2021-04-17 MED ORDER — SODIUM CHLORIDE 0.9 % IV SOLN: 250.0000 mL | INTRAVENOUS | Status: DC | PRN

## 2021-04-17 MED ORDER — PHENYLEPHRINE HCL-NACL 10-0.9 MG/250ML-% IV SOLN
INTRAVENOUS | Status: DC | PRN
  Administered 2021-04-17: 25 ug/min via INTRAVENOUS

## 2021-04-17 MED ORDER — HEPARIN SODIUM (PORCINE) 1000 UNIT/ML IJ SOLN
INTRAMUSCULAR | Status: DC | PRN
  Administered 2021-04-17: 1000 [IU] via INTRAVENOUS

## 2021-04-17 MED ORDER — SUGAMMADEX SODIUM 200 MG/2ML IV SOLN
INTRAVENOUS | Status: DC | PRN
  Administered 2021-04-17: 200 mg via INTRAVENOUS

## 2021-04-17 MED ORDER — HEPARIN (PORCINE) IN NACL 1000-0.9 UT/500ML-% IV SOLN
INTRAVENOUS | Status: DC | PRN
  Administered 2021-04-17: 500 mL

## 2021-04-17 MED ORDER — SODIUM CHLORIDE 0.9% FLUSH: 3.0000 mL | INTRAVENOUS | Status: DC | PRN

## 2021-04-17 MED ORDER — ROCURONIUM BROMIDE 10 MG/ML (PF) SYRINGE
PREFILLED_SYRINGE | INTRAVENOUS | Status: DC | PRN
  Administered 2021-04-17: 80 mg via INTRAVENOUS

## 2021-04-17 MED ORDER — SODIUM CHLORIDE 0.9 % IV SOLN: INTRAVENOUS | Status: DC

## 2021-04-17 MED ORDER — FENTANYL CITRATE (PF) 100 MCG/2ML IJ SOLN
INTRAMUSCULAR | Status: DC | PRN
  Administered 2021-04-17: 50 ug via INTRAVENOUS

## 2021-04-17 SURGICAL SUPPLY — 14 items
BAG SNAP BAND KOVER 36X36 (MISCELLANEOUS) ×1 IMPLANT
BLANKET WARM UNDERBOD FULL ACC (MISCELLANEOUS) ×1 IMPLANT
CATH JOSEPH QUAD ALLRED 6F REP (CATHETERS) ×1 IMPLANT
CATH SMTCH THERMOCOOL SF FJ (CATHETERS) ×1 IMPLANT
CATH WEB BI DIR CSDF CRV REPRO (CATHETERS) ×1 IMPLANT
MAT PREVALON FULL STRYKER (MISCELLANEOUS) ×1 IMPLANT
PACK EP LATEX FREE (CUSTOM PROCEDURE TRAY) ×2
PACK EP LF (CUSTOM PROCEDURE TRAY) ×1 IMPLANT
PAD PRO RADIOLUCENT 2001M-C (PAD) ×2 IMPLANT
PATCH CARTO3 (PAD) ×1 IMPLANT
SHEATH PINNACLE 6F 10CM (SHEATH) ×1 IMPLANT
SHEATH PINNACLE 7F 10CM (SHEATH) ×1 IMPLANT
SHEATH PINNACLE 8F 10CM (SHEATH) ×1 IMPLANT
TUBING SMART ABLATE COOLFLOW (TUBING) ×1 IMPLANT

## 2021-04-17 NOTE — Interval H&P Note (Signed)
History and Physical Interval Note:  04/17/2021 10:10 AM  Allen James  has presented today for surgery, with the diagnosis of aflutter.  The various methods of treatment have been discussed with the patient and family. After consideration of risks, benefits and other options for treatment, the patient has consented to  Procedure(s): A-FLUTTER ABLATION (N/A) as a surgical intervention.  The patient's history has been reviewed, patient examined, no change in status, stable for surgery.  I have reviewed the patient's chart and labs.  Questions were answered to the patient's satisfaction.     [name]

## 2021-04-17 NOTE — Anesthesia Preprocedure Evaluation (Signed)
Anesthesia Evaluation  Patient identified by MRN, date of birth, ID band Patient awake    Reviewed: Allergy & Precautions, NPO status , Patient's Chart, lab work & pertinent test results  Airway Mallampati: IV  TM Distance: <3 FB Neck ROM: Full    Dental  (+) Missing,    Pulmonary sleep apnea , former smoker,    Pulmonary exam normal        Cardiovascular hypertension, + CAD, + Cardiac Stents and + CABG   Rhythm:Irregular Rate:Normal     Neuro/Psych negative neurological ROS  negative psych ROS   GI/Hepatic Neg liver ROS, hiatal hernia, GERD  ,  Endo/Other  negative endocrine ROS  Renal/GU Renal disease     Musculoskeletal  (+) Arthritis ,   Abdominal Normal abdominal exam  (+)   Peds  Hematology negative hematology ROS (+)   Anesthesia Other Findings   Reproductive/Obstetrics                             Anesthesia Physical Anesthesia Plan  ASA: III  Anesthesia Plan: General   Post-op Pain Management:    Induction: Intravenous  PONV Risk Score and Plan: 2 and Ondansetron and Dexamethasone  Airway Management Planned: Oral ETT and Video Laryngoscope Planned  Additional Equipment: None  Intra-op Plan:   Post-operative Plan: Extubation in OR  Informed Consent: I have reviewed the patients History and Physical, chart, labs and discussed the procedure including the risks, benefits and alternatives for the proposed anesthesia with the patient or authorized representative who has indicated his/her understanding and acceptance.     Dental advisory given  Plan Discussed with: CRNA  Anesthesia Plan Comments: (Echo:  1. Left ventricular ejection fraction, by visual estimation, is 55 to  60%. The left ventricle has normal function. There is mildly increased  left ventricular hypertrophy.  2. Abnormal septal motion consistent with post-operative status.  3. Left ventricular  diastolic parameters are consistent with Grade I  diastolic dysfunction (impaired relaxation).  4. The left ventricle has no regional wall motion abnormalities.  5. Global right ventricle has normal systolic function.The right  ventricular size is normal. No increase in right ventricular wall  thickness.  6. Left atrial size was normal.  7. Right atrial size was normal.  8. Presence of pericardial fat pad.  9. Trivial pericardial effusion is present.  10. Mild mitral annular calcification.  11. The mitral valve is degenerative. Trivial mitral valve regurgitation.  No evidence of mitral stenosis.  12. The tricuspid valve is grossly normal.  13. The aortic valve is tricuspid. Aortic valve regurgitation is mild. No  evidence of aortic valve sclerosis or stenosis.  14. Pulmonic regurgitation is mild.  15. The pulmonic valve was grossly normal. Pulmonic valve regurgitation is  mild.  16. Aortic dilatation noted.  17. 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  18. No significant change from prior study. )        Anesthesia Quick Evaluation

## 2021-04-17 NOTE — Progress Notes (Signed)
Site area: rt groin fv sheaths x3 pulled by Wynonia Sours Site Prior to Removal:  Level 0 Pressure Applied For: 12 minutes Manual:   yes Patient Status During Pull:  stable Post Pull Site:  Level 0 Post Pull Instructions Given:  yes Post Pull Pulses Present: rt dp palpable Dressing Applied:  Gauze  Bedrest begins @ 1240 Comments:

## 2021-04-17 NOTE — Transfer of Care (Signed)
Immediate Anesthesia Transfer of Care Note  Patient: Allen James  Procedure(s) Performed: A-FLUTTER ABLATION (N/A )  Patient Location: Cath Lab  Anesthesia Type:General  Level of Consciousness: awake, alert  and oriented  Airway & Oxygen Therapy: Patient Spontanous Breathing and Patient connected to nasal cannula oxygen  Post-op Assessment: Report given to RN and Post -op Vital signs reviewed and stable  Post vital signs: Reviewed and stable  Last Vitals:  Vitals Value Taken Time  BP 118/52 04/17/21 1223  Temp 36.3 C 04/17/21 1224  Pulse 81 04/17/21 1226  Resp 21 04/17/21 1226  SpO2 96 % 04/17/21 1226  Vitals shown include unvalidated device data.  Last Pain:  Vitals:   04/17/21 1224  TempSrc: Temporal  PainSc: 0-No pain         Complications: No complications documented.

## 2021-04-17 NOTE — Anesthesia Postprocedure Evaluation (Signed)
Anesthesia Post Note  Patient: Allen James  Procedure(s) Performed: A-FLUTTER ABLATION (N/A )     Patient location during evaluation: PACU Anesthesia Type: General Level of consciousness: awake and alert Pain management: pain level controlled Vital Signs Assessment: post-procedure vital signs reviewed and stable Respiratory status: spontaneous breathing, nonlabored ventilation, respiratory function stable and patient connected to nasal cannula oxygen Cardiovascular status: blood pressure returned to baseline and stable Postop Assessment: no apparent nausea or vomiting Anesthetic complications: no   No complications documented.  Last Vitals:  Vitals:   04/17/21 1254 04/17/21 1255  BP:  (!) 133/54  Pulse:  73  Resp:  16  Temp: (!) 36.3 C   SpO2:  98%    Last Pain:  Vitals:   04/17/21 1525  TempSrc:   PainSc: 0-No pain                 Effie Berkshire

## 2021-04-17 NOTE — Anesthesia Procedure Notes (Signed)
Procedure Name: Intubation Performed by: Valda Favia, CRNA Pre-anesthesia Checklist: Patient identified, Emergency Drugs available, Suction available and Patient being monitored Patient Re-evaluated:Patient Re-evaluated prior to induction Oxygen Delivery Method: Circle System Utilized Preoxygenation: Pre-oxygenation with 100% oxygen Induction Type: IV induction Ventilation: Mask ventilation without difficulty Laryngoscope Size: Glidescope and 4 Grade View: Grade I Tube type: Oral Tube size: 7.5 mm Number of attempts: 1 Airway Equipment and Method: Stylet and Oral airway Placement Confirmation: ETT inserted through vocal cords under direct vision,  positive ETCO2 and breath sounds checked- equal and bilateral Secured at: 22 cm Tube secured with: Tape Dental Injury: Teeth and Oropharynx as per pre-operative assessment

## 2021-04-17 NOTE — Discharge Instructions (Signed)
Femoral Site Care  This sheet gives you information about how to care for yourself after your procedure. Your health care provider may also give you more specific instructions. If you have problems or questions, contact your health care provider. What can I expect after the procedure? After the procedure, it is common to have:  Bruising that usually fades within 1-2 weeks.  Tenderness at the site. Follow these instructions at home: Wound care  Follow instructions from your health care provider about how to take care of your insertion site. Make sure you: ? Wash your hands with soap and water before you change your bandage (dressing). If soap and water are not available, use hand sanitizer. ? Change your dressing as told by your health care provider. ? Leave stitches (sutures), skin glue, or adhesive strips in place. These skin closures may need to stay in place for 2 weeks or longer. If adhesive strip edges start to loosen and curl up, you may trim the loose edges. Do not remove adhesive strips completely unless your health care provider tells you to do that.  Do not take baths, swim, or use a hot tub until your health care provider approves.  You may shower 24-48 hours after the procedure or as told by your health care provider. ? Gently wash the site with plain soap and water. ? Pat the area dry with a clean towel. ? Do not rub the site. This may cause bleeding.  Do not apply powder or lotion to the site. Keep the site clean and dry.  Check your femoral site every day for signs of infection. Check for: ? Redness, swelling, or pain. ? Fluid or blood. ? Warmth. ? Pus or a bad smell. Activity  For the first 2-3 days after your procedure, or as long as directed: ? Avoid climbing stairs as much as possible. ? Do not squat.  Do not lift anything that is heavier than 10 lb (4.5 kg), or the limit that you are told, until your health care provider says that it is safe.  Rest as  directed. ? Avoid sitting for a long time without moving. Get up to take short walks every 1-2 hours.  Do not drive for 24 hours if you were given a medicine to help you relax (sedative). General instructions  Take over-the-counter and prescription medicines only as told by your health care provider.  Keep all follow-up visits as told by your health care provider. This is important. Contact a health care provider if you have:  A fever or chills.  You have redness, swelling, or pain around your insertion site. Get help right away if:  The catheter insertion area swells very fast.  You pass out.  You suddenly start to sweat or your skin gets clammy.  The catheter insertion area is bleeding, and the bleeding does not stop when you hold steady pressure on the area.  The area near or just beyond the catheter insertion site becomes pale, cool, tingly, or numb. These symptoms may represent a serious problem that is an emergency. Do not wait to see if the symptoms will go away. Get medical help right away. Call your local emergency services (911 in the [place] Do not drive yourself to the hospital. Summary  After the procedure, it is common to have bruising that usually fades within 1-2 weeks.  Check your femoral site every day for signs of infection.  Do not lift anything that is heavier than 10 lb (4.5 kg), or   the limit that you are told, until your health care provider says that it is safe. This information is not intended to replace advice given to you by your health care provider. Make sure you discuss any questions you have with your health care provider. Document Revised: 07/15/2020 Document Reviewed: 07/15/2020 Elsevier Patient Education  [place] procedure care instructions No driving for 4 days. No lifting over 5 lbs for 1 week. No vigorous or sexual activity for 1 week. You may return to work/your usual activities on 04/25/21. Keep procedure site clean & dry. If  you notice increased pain, swelling, bleeding or pus, call/return!  You may shower after 24 hours, but no soaking in baths/hot tubs/pools for 1 week.

## 2021-04-18 ENCOUNTER — Encounter (HOSPITAL_COMMUNITY): Payer: Self-pay | Admitting: Internal Medicine

## 2021-04-20 ENCOUNTER — Ambulatory Visit (HOSPITAL_COMMUNITY): Payer: Medicare HMO | Admitting: Nurse Practitioner

## 2021-04-27 DIAGNOSIS — I10 Essential (primary) hypertension: Secondary | ICD-10-CM | POA: Diagnosis not present

## 2021-04-27 DIAGNOSIS — I25810 Atherosclerosis of coronary artery bypass graft(s) without angina pectoris: Secondary | ICD-10-CM | POA: Diagnosis not present

## 2021-04-27 DIAGNOSIS — M48062 Spinal stenosis, lumbar region with neurogenic claudication: Secondary | ICD-10-CM | POA: Diagnosis not present

## 2021-04-27 DIAGNOSIS — Z8679 Personal history of other diseases of the circulatory system: Secondary | ICD-10-CM | POA: Diagnosis not present

## 2021-04-27 DIAGNOSIS — E7800 Pure hypercholesterolemia, unspecified: Secondary | ICD-10-CM | POA: Diagnosis not present

## 2021-05-17 ENCOUNTER — Ambulatory Visit: Payer: Medicare HMO | Admitting: Internal Medicine

## 2021-05-17 ENCOUNTER — Other Ambulatory Visit: Payer: Self-pay

## 2021-05-17 VITALS — BP 120/60 | HR 75 | Ht 71.0 in | Wt 264.4 lb

## 2021-05-17 DIAGNOSIS — I4892 Unspecified atrial flutter: Secondary | ICD-10-CM | POA: Diagnosis not present

## 2021-05-17 DIAGNOSIS — I5030 Unspecified diastolic (congestive) heart failure: Secondary | ICD-10-CM | POA: Insufficient documentation

## 2021-05-17 MED ORDER — ASPIRIN EC 81 MG PO TBEC: 81.0000 mg | DELAYED_RELEASE_TABLET | Freq: Every day | ORAL | 3 refills | Status: AC

## 2021-05-17 NOTE — Progress Notes (Signed)
HPI Allen James returns today after undergoing catheter ablation of atrial flutter. He is a pleasant 80 yo man with HTN, CAD, and dyslipidemia who was found to have atrial flutter several months ago and underwent EP study and catheter ablation. Since then he has done well. He feels better. He denies palpitations. He has not had to take lasix since his ablation.  Allergies  Allergen Reactions   Antihistamines, Diphenhydramine-Type Anxiety    Jittery     Current Outpatient Medications  Medication Sig Dispense Refill   amLODipine (NORVASC) 5 MG tablet Take 2.5 mg by mouth daily.     apixaban (ELIQUIS) 5 MG TABS tablet Take 1 tablet (5 mg total) by mouth 2 (two) times daily. 60 tablet 3   atorvastatin (LIPITOR) 40 MG tablet Take 1 tablet (40 mg total) by mouth daily at 6 PM. 90 tablet 3   diclofenac Sodium (VOLTAREN) 1 % GEL Apply 1 application topically 4 (four) times daily as needed (pain).     furosemide (LASIX) 20 MG tablet Take 1 tablet by mouth daily for 3 days then only as needed for swelling (Patient taking differently: Take by mouth daily as needed for edema.) 10 tablet 0   HYDROcodone-acetaminophen (NORCO/VICODIN) 5-325 MG per tablet Take 1 tablet by mouth 3 (three) times daily as needed for moderate pain.      meloxicam (MOBIC) 15 MG tablet Take 7.5 mg by mouth daily.     metoprolol succinate (TOPROL-XL) 50 MG 24 hr tablet Take 1 tablet in the AM and 1/2 tablet in the PM (Patient taking differently: Take 25-50 mg by mouth See admin instructions. Take 50 mg in the morning and 25 mg in the evening) 90 tablet 3   traMADol (ULTRAM) 50 MG tablet Take 2 tablets (100 mg total) by mouth 2 (two) times daily. (Patient taking differently: Take 150 mg by mouth 2 (two) times daily.) 60 tablet 2   pantoprazole (PROTONIX) 40 MG tablet Take 1 tablet (40 mg total) by mouth daily. 90 tablet 3   No current facility-administered medications for this visit.     Past Medical History:   Diagnosis Date   Abdominal hernia    pt. states he had it for 10 yrs or longer   Abnormal findings on cardiac catheterization    ! Do NOT use RIGHT radial access on future caths or PCI.   Acute non-ST elevation myocardial infarction (NSTEMI) ([place]) 04/12/2014   Acute non-ST segment elevation myocardial infarction ([place]) 04/12/2014   Adiposity 04/14/2014   AKI (acute kidney injury) ([place]) 04/14/2014   Anginal pain (HCC)    Arthritis    Back pain    Benign essential hypertension 12/20/2016   CAD (coronary artery disease)    a. NSTEMI 04/12/14 s/p DES x 2 to RCA. Prox 3.5x38 mm Xience Alpine DES, Dist 3.5x28 mm Xience DES; EF 65-70%. Staged PCI to the OM1 with 2.75 x 12 mm Resolute DES      Chronic coronary artery disease 04/14/2014   Chronic lower back pain 07/13/2014   Dyspepsia 03/07/2016   Dyspnea    GERD (gastroesophageal reflux disease)    H/O hiatal hernia    Hyperlipidemia    Injury of kidney 04/14/2014   Muscle soreness 04/27/2014   NSTEMI (non-ST elevated myocardial infarction) ([place]) 04/12/2014   Obesity 04/14/2014   Obstructive apnea 12/28/2014   OSA on CPAP    Osteoarthrosis 09/30/2014   Primary insomnia 03/07/2016   Pure hypercholesterolemia 12/28/2014   SLEEP  APNEA 02/24/2009   Qualifier: Diagnosis of  By: Annamaria Boots MD, Clinton D     ROS:   All systems reviewed and negative except as noted in the HPI.   Past Surgical History:  Procedure Laterality Date   A-FLUTTER ABLATION N/A 04/17/2021   Procedure: A-FLUTTER ABLATION;  Surgeon: Evans Lance, MD;  Location: [place] [facility];  Service: Cardiovascular;  Laterality: N/A;   APPENDECTOMY     Remote   CARPAL TUNNEL RELEASE Bilateral    CORONARY ANGIOPLASTY  04/12/14   STENT TO RCA   CORONARY ARTERY BYPASS GRAFT N/A 06/06/2018   Procedure: CORONARY ARTERY BYPASS GRAFTING (CABG) x 3 WITH ENDOSCOPIC HARVESTING OF RIGHT SAPHENOUS VEIN;  Surgeon: Ivin Poot, MD;  Location: [place];  Service: Open Heart Surgery;  Laterality: N/A;   KNEE  ARTHROSCOPY Bilateral    LEFT HEART CATH AND CORONARY ANGIOGRAPHY N/A 05/30/2018   Procedure: LEFT HEART CATH AND CORONARY ANGIOGRAPHY;  Surgeon: Sherren Mocha, MD;  Location: [place] [facility];  Service: Cardiovascular;  Laterality: N/A;   LEFT HEART CATHETERIZATION WITH CORONARY ANGIOGRAM N/A 04/12/2014   Procedure: LEFT HEART CATHETERIZATION WITH CORONARY ANGIOGRAM;  Surgeon: Blane Ohara, MD;  Location: [facility] C[facility];  Service: Cardiovascular;  Laterality: N/A;   PERCUTANEOUS CORONARY STENT INTERVENTION (PCI-S)  04/12/2014   Procedure: PERCUTANEOUS CORONARY STENT INTERVENTION (PCI-S);  Surgeon: Blane Ohara, MD;  Location: [facility] CATH LAB;  Service: Cardiovascular;;   PERCUTANEOUS CORONARY STENT INTERVENTION (PCI-S) N/A 04/14/2014   Procedure: PERCUTANEOUS CORONARY STENT INTERVENTION (PCI-S);  Surgeon: Peter M Martinique, MD;  Location: [facility] CATH LAB;  Service: Cardiovascular;  Laterality: N/A;   TEE WITHOUT CARDIOVERSION N/A 06/06/2018   Procedure: TRANSESOPHAGEAL ECHOCARDIOGRAM (TEE);  Surgeon: [place] Gum, Collier Salina, MD;  Location: [place];  Service: Open Heart Surgery;  Laterality: N/A;     Family History  Problem Relation Age of Onset   Coronary artery disease Mother 71   Emphysema Father        Ecologist     Social History   Socioeconomic History   Marital status: Married    Spouse name: Not on file   Number of children: Not on file   Years of education: Not on file   Highest education level: Not on file  Occupational History   Not on file  Tobacco Use   Smoking status: Former    Packs/day: 1.00    Years: 20.00    Pack years: 20.00    Types: Cigarettes    Quit date: 11/26/1978    Years since quitting: 42.5   Smokeless tobacco: Never  Substance and Sexual Activity   Alcohol use: No   Drug use: No   Sexual activity: Not on file  Other Topics Concern   Not on file  Social History Narrative   Not on file   Social Determinants of Health   Financial Resource Strain: Not on  file  Food Insecurity: Not on file  Transportation Needs: Not on file  Physical Activity: Not on file  Stress: Not on file  Social Connections: Not on file  Intimate Partner Violence: Not on file     BP 120/60   Pulse 75   Ht 5\' 11"  (1.803 m)   Wt 264 lb 6.4 oz (119.9 kg)   SpO2 96%   BMI 36.88 kg/m   Physical Exam:  Well appearing NAD HEENT: Unremarkable Neck:  No JVD, no thyromegally Lymphatics:  No adenopathy Back:  No CVA tenderness Lungs:  Clear with no wheezes HEART:  Regular rate rhythm, no murmurs, no rubs, no clicks Abd:  soft, positive bowel sounds, no organomegally, no rebound, no guarding Ext:  2 plus pulses, 2+ edema, no cyanosis, no clubbing Skin:  No rashes no nodules Neuro:  CN II through XII intact, motor grossly intact  EKG - nsr   Assess/Plan:  Atrial flutter - he has undergone catheter ablation. He is maintaining NSR. Coags - he will stop eliquis and restart the ASA. CAD - he denies anginal symptoms.  Diastolic heart failure - he appears to be class 1 now that he is back in NSR. Allen James Allen Adamson,MD

## 2021-05-17 NOTE — Patient Instructions (Addendum)
Medication Instructions:  Your physician has recommended you make the following change in your medication:    STOP Eliquis  2.  START taking aspirin 81 mg-  Take one tablet by mouth daily   Labwork: None ordered.  Testing/Procedures: None ordered.  Follow-Up: Your physician wants you to follow-up in: as needed with [name], MD    Any Other Special Instructions Will Be Listed Below (If Applicable).  If you need a refill on your cardiac medications before your next appointment, please call your pharmacy.

## 2021-08-01 DIAGNOSIS — E7800 Pure hypercholesterolemia, unspecified: Secondary | ICD-10-CM | POA: Diagnosis not present

## 2021-08-01 DIAGNOSIS — I25810 Atherosclerosis of coronary artery bypass graft(s) without angina pectoris: Secondary | ICD-10-CM | POA: Diagnosis not present

## 2021-08-01 DIAGNOSIS — R7301 Impaired fasting glucose: Secondary | ICD-10-CM | POA: Diagnosis not present

## 2021-08-01 DIAGNOSIS — M48062 Spinal stenosis, lumbar region with neurogenic claudication: Secondary | ICD-10-CM | POA: Diagnosis not present

## 2021-08-01 DIAGNOSIS — Z23 Encounter for immunization: Secondary | ICD-10-CM | POA: Diagnosis not present

## 2021-08-01 DIAGNOSIS — I10 Essential (primary) hypertension: Secondary | ICD-10-CM | POA: Diagnosis not present

## 2022-01-19 DIAGNOSIS — Z85828 Personal history of other malignant neoplasm of skin: Secondary | ICD-10-CM | POA: Diagnosis not present

## 2022-01-19 DIAGNOSIS — C44329 Squamous cell carcinoma of skin of other parts of face: Secondary | ICD-10-CM | POA: Diagnosis not present

## 2022-01-19 DIAGNOSIS — C44319 Basal cell carcinoma of skin of other parts of face: Secondary | ICD-10-CM | POA: Diagnosis not present

## 2022-01-19 DIAGNOSIS — L814 Other melanin hyperpigmentation: Secondary | ICD-10-CM | POA: Diagnosis not present

## 2022-01-19 DIAGNOSIS — L821 Other seborrheic keratosis: Secondary | ICD-10-CM | POA: Diagnosis not present

## 2022-01-19 DIAGNOSIS — L570 Actinic keratosis: Secondary | ICD-10-CM | POA: Diagnosis not present

## 2022-02-12 DIAGNOSIS — R7303 Prediabetes: Secondary | ICD-10-CM | POA: Diagnosis not present

## 2022-02-12 DIAGNOSIS — I10 Essential (primary) hypertension: Secondary | ICD-10-CM | POA: Diagnosis not present

## 2022-02-12 DIAGNOSIS — I25810 Atherosclerosis of coronary artery bypass graft(s) without angina pectoris: Secondary | ICD-10-CM | POA: Diagnosis not present

## 2022-02-12 DIAGNOSIS — M4800 Spinal stenosis, site unspecified: Secondary | ICD-10-CM | POA: Diagnosis not present

## 2022-02-12 DIAGNOSIS — Z0000 Encounter for general adult medical examination without abnormal findings: Secondary | ICD-10-CM | POA: Diagnosis not present

## 2022-02-12 DIAGNOSIS — Z79899 Other long term (current) drug therapy: Secondary | ICD-10-CM | POA: Diagnosis not present

## 2022-02-12 DIAGNOSIS — E7800 Pure hypercholesterolemia, unspecified: Secondary | ICD-10-CM | POA: Diagnosis not present

## 2022-02-12 DIAGNOSIS — Z23 Encounter for immunization: Secondary | ICD-10-CM | POA: Diagnosis not present

## 2022-02-19 DIAGNOSIS — C441122 Basal cell carcinoma of skin of right lower eyelid, including canthus: Secondary | ICD-10-CM | POA: Diagnosis not present

## 2022-02-23 ENCOUNTER — Ambulatory Visit: Payer: Medicare HMO | Admitting: Cardiovascular Disease

## 2022-02-23 ENCOUNTER — Encounter: Payer: Self-pay | Admitting: Cardiovascular Disease

## 2022-02-23 VITALS — BP 130/60 | HR 83 | Ht 71.0 in | Wt 276.8 lb

## 2022-02-23 DIAGNOSIS — I483 Typical atrial flutter: Secondary | ICD-10-CM

## 2022-02-23 DIAGNOSIS — I10 Essential (primary) hypertension: Secondary | ICD-10-CM | POA: Diagnosis not present

## 2022-02-23 DIAGNOSIS — R0609 Other forms of dyspnea: Secondary | ICD-10-CM | POA: Diagnosis not present

## 2022-02-23 DIAGNOSIS — I25119 Atherosclerotic heart disease of native coronary artery with unspecified angina pectoris: Secondary | ICD-10-CM

## 2022-02-23 DIAGNOSIS — E782 Mixed hyperlipidemia: Secondary | ICD-10-CM | POA: Diagnosis not present

## 2022-02-23 NOTE — Progress Notes (Signed)
?Cardiology Office Note:   ? ?Date:  02/23/2022  ? ?ID:  Allen James, DOB 1941-01-29, MRN [id number] ? ?PCP:  Caren Macadam, MD ?  ?[facility] Providers ?Cardiologist:  Sherren Mocha, MD    ? ?Referring MD: Caren Macadam, MD  ? ?Chief Complaint  ?Patient presents with  ? Coronary Artery Disease  ? ? ?History of Present Illness:   ? ?Allen James is a 81 y.o. male with a hx of CAD s/p CABG in 2019, presenting for follow-up evaluation.  During last year's evaluation he was found to have atrial flutter.  He ultimately underwent a flutter ablation with success.  This significantly improved his exercise tolerance.  Stress Myoview was performed and showed no ischemia.  He is doing fairly well at present.  He does note some recurrence of shortness of breath with activity, especially when he is just getting started.  No chest pain or pressure.  No heart palpitations, orthopnea, or PND.  He does admit to mild leg swelling states that he has been pretty inactive over the winter and we note that he has gained 10 to 15 pounds.  He is compliant with his medications.  Overall he feels that he is doing pretty well. ? ?Past Medical History:  ?Diagnosis Date  ? Abdominal hernia   ? pt. states he had it for 10 yrs or longer  ? Abnormal findings on cardiac catheterization   ? ! Do NOT use RIGHT radial access on future caths or PCI.  ? Acute non-ST elevation myocardial infarction (NSTEMI) ([place]) 04/12/2014  ? Acute non-ST segment elevation myocardial infarction ([place]) 04/12/2014  ? Adiposity 04/14/2014  ? AKI (acute kidney injury) ([place]) 04/14/2014  ? Anginal pain ([place])   ? Arthritis   ? Back pain   ? Benign essential hypertension 12/20/2016  ? CAD (coronary artery disease)   ? a. NSTEMI 04/12/14 s/p DES x 2 to RCA. Prox 3.5x38 mm Xience Alpine DES, Dist 3.5x28 mm Xience DES; EF 65-70%. Staged PCI to the OM1 with 2.75 x 12 mm Resolute DES     ? Chronic coronary artery disease 04/14/2014  ? Chronic lower back pain 07/13/2014  ?  Dyspepsia 03/07/2016  ? Dyspnea   ? GERD (gastroesophageal reflux disease)   ? H/O hiatal hernia   ? Hyperlipidemia   ? Injury of kidney 04/14/2014  ? Muscle soreness 04/27/2014  ? NSTEMI (non-ST elevated myocardial infarction) ([place]) 04/12/2014  ? Obesity 04/14/2014  ? Obstructive apnea 12/28/2014  ? OSA on CPAP   ? Osteoarthrosis 09/30/2014  ? Primary insomnia 03/07/2016  ? Pure hypercholesterolemia 12/28/2014  ? SLEEP APNEA 02/24/2009  ? Qualifier: Diagnosis of  By: Annamaria Boots MD, Tarri Fuller D   ? ? ?Past Surgical History:  ?Procedure Laterality Date  ? A-FLUTTER ABLATION N/A 04/17/2021  ? Procedure: A-FLUTTER ABLATION;  Surgeon: Evans Lance, MD;  Location: [place] [facility];  Service: Cardiovascular;  Laterality: N/A;  ? APPENDECTOMY    ? Remote  ? CARPAL TUNNEL RELEASE Bilateral   ? CORONARY ANGIOPLASTY  04/12/14  ? STENT TO RCA  ? CORONARY ARTERY BYPASS GRAFT N/A 06/06/2018  ? Procedure: CORONARY ARTERY BYPASS GRAFTING (CABG) x 3 WITH ENDOSCOPIC HARVESTING OF RIGHT SAPHENOUS VEIN;  Surgeon: Ivin Poot, MD;  Location: [place];  Service: Open Heart Surgery;  Laterality: N/A;  ? KNEE ARTHROSCOPY Bilateral   ? LEFT HEART CATH AND CORONARY ANGIOGRAPHY N/A 05/30/2018  ? Procedure: LEFT HEART CATH AND CORONARY ANGIOGRAPHY;  Surgeon: Sherren Mocha,  MD;  Location: [place] [facility];  Service: Cardiovascular;  Laterality: N/A;  ? LEFT HEART CATHETERIZATION WITH CORONARY ANGIOGRAM N/A 04/12/2014  ? Procedure: LEFT HEART CATHETERIZATION WITH CORONARY ANGIOGRAM;  Surgeon: Blane Ohara, MD;  Location: [facility] CATH LAB;  Service: Cardiovascular;  Laterality: N/A;  ? PERCUTANEOUS CORONARY STENT INTERVENTION (PCI-S)  04/12/2014  ? Procedure: PERCUTANEOUS CORONARY STENT INTERVENTION (PCI-S);  Surgeon: Blane Ohara, MD;  Location: [facility] CATH LAB;  Service: Cardiovascular;;  ? PERCUTANEOUS CORONARY STENT INTERVENTION (PCI-S) N/A 04/14/2014  ? Procedure: PERCUTANEOUS CORONARY STENT INTERVENTION (PCI-S);  Surgeon: Peter M Martinique, MD;  Location: [facility]  CATH LAB;  Service: Cardiovascular;  Laterality: N/A;  ? TEE WITHOUT CARDIOVERSION N/A 06/06/2018  ? Procedure: TRANSESOPHAGEAL ECHOCARDIOGRAM (TEE);  Surgeon: [place] Gum, Collier Salina, MD;  Location: [place];  Service: Open Heart Surgery;  Laterality: N/A;  ? ? ?Current Medications: ?Current Meds  ?Medication Sig  ? amLODipine (NORVASC) 5 MG tablet Take 2.5 mg by mouth daily.  ? aspirin EC 81 MG tablet Take 1 tablet (81 mg total) by mouth daily. Swallow whole.  ? atorvastatin (LIPITOR) 40 MG tablet Take 1 tablet (40 mg total) by mouth daily at 6 PM.  ? diclofenac Sodium (VOLTAREN) 1 % GEL Apply 1 application topically 4 (four) times daily as needed (pain).  ? furosemide (LASIX) 20 MG tablet Take 1 tablet by mouth daily for 3 days then only as needed for swelling (Patient taking differently: Take by mouth daily as needed for edema.)  ? HYDROcodone-acetaminophen (NORCO/VICODIN) 5-325 MG per tablet Take 1 tablet by mouth as needed for moderate pain. Per patient taking 1 tablet as needed  ? meloxicam (MOBIC) 15 MG tablet Take 7.5 mg by mouth daily.  ? metoprolol succinate (TOPROL-XL) 50 MG 24 hr tablet Take 1 tablet in the AM and 1/2 tablet in the PM (Patient taking differently: Take 25-50 mg by mouth See admin instructions. Take 50 mg in the morning and 25 mg in the evening)  ? traMADol (ULTRAM) 50 MG tablet Take 2 tablets (100 mg total) by mouth 2 (two) times daily. (Patient taking differently: Take 150 mg by mouth 2 (two) times daily.)  ?  ? ?Allergies:   Antihistamines, diphenhydramine-type  ? ?Social History  ? ?Socioeconomic History  ? Marital status: Married  ?  Spouse name: Not on file  ? Number of children: Not on file  ? Years of education: Not on file  ? Highest education level: Not on file  ?Occupational History  ? Not on file  ?Tobacco Use  ? Smoking status: Former  ?  Packs/day: 1.00  ?  Years: 20.00  ?  Pack years: 20.00  ?  Types: Cigarettes  ?  Quit date: 11/26/1978  ?  Years since quitting: 43.2  ? Smokeless  tobacco: Never  ?Substance and Sexual Activity  ? Alcohol use: No  ? Drug use: No  ? Sexual activity: Not on file  ?Other Topics Concern  ? Not on file  ?Social History Narrative  ? Not on file  ? ?Social Determinants of Health  ? ?Financial Resource Strain: Not on file  ?Food Insecurity: Not on file  ?Transportation Needs: Not on file  ?Physical Activity: Not on file  ?Stress: Not on file  ?Social Connections: Not on file  ?  ? ?Family History: ?The patient's family history includes Coronary artery disease (age of onset: 90) in his mother; Emphysema in his father. ? ?ROS:   ?Please see the history of present  illness.    ?All other systems reviewed and are negative. ? ?EKGs/Labs/Other Studies Reviewed:   ? ?The following studies were reviewed today: ?Echo 11/24/2019: ? 1. Left ventricular ejection fraction, by visual estimation, is 55 to  ?60%. The left ventricle has normal function. There is mildly increased  ?left ventricular hypertrophy.  ? 2. Abnormal septal motion consistent with post-operative status.  ? 3. Left ventricular diastolic parameters are consistent with Grade I  ?diastolic dysfunction (impaired relaxation).  ? 4. The left ventricle has no regional wall motion abnormalities.  ? 5. Global right ventricle has normal systolic function.The right  ?ventricular size is normal. No increase in right ventricular wall  ?thickness.  ? 6. Left atrial size was normal.  ? 7. Right atrial size was normal.  ? 8. Presence of pericardial fat pad.  ? 9. Trivial pericardial effusion is present.  ?10. Mild mitral annular calcification.  ?11. The mitral valve is degenerative. Trivial mitral valve regurgitation.  ?No evidence of mitral stenosis.  ?12. The tricuspid valve is grossly normal.  ?13. The aortic valve is tricuspid. Aortic valve regurgitation is mild. No  ?evidence of aortic valve sclerosis or stenosis.  ?14. Pulmonic regurgitation is mild.  ?15. The pulmonic valve was grossly normal. Pulmonic valve regurgitation  is  ?mild.  ?16. Aortic dilatation noted.  ?17. 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  ?18. No significant change from prior study.  ? ?Myocardial Perfusion Study 03/14/21: ?The left ventric

## 2022-02-23 NOTE — Patient Instructions (Signed)
Medication Instructions:  ?Your physician recommends that you continue on your current medications as directed. Please refer to the Current Medication list given to you today. ? ?*If you need a refill on your cardiac medications before your next appointment, please call your pharmacy* ? ? ?Lab Work: ?NONE ?If you have labs (blood work) drawn today and your tests are completely normal, you will receive your results only by: ?MyChart Message (if you have MyChart) OR ?A paper copy in the mail ?If you have any lab test that is abnormal or we need to change your treatment, we will call you to review the results. ? ? ?Testing/Procedures: ?ECHO ?Your physician has requested that you have an echocardiogram. Echocardiography is a painless test that uses sound waves to create images of your heart. It provides your doctor with information about the size and shape of your heart and how well your heart?s chambers and valves are working. This procedure takes approximately one hour. There are no restrictions for this procedure. ? ? ? ?Follow-Up: ?At [facility], you and your health needs are our priority.  As part of our continuing mission to provide you with exceptional heart care, we have created designated Provider Care Teams.  These Care Teams include your primary Cardiologist (physician) and Advanced Practice Providers (APPs -  Physician Assistants and Nurse Practitioners) who all work together to provide you with the care you need, when you need it. ? ?Your next appointment:   ?1 year(s) ? ?The format for your next appointment:   ?In Person ? ?Provider:   ?Sherren Mocha, MD   ? ? ?  ?

## 2022-03-13 ENCOUNTER — Ambulatory Visit (HOSPITAL_COMMUNITY): Payer: Medicare HMO | Attending: Cardiology

## 2022-03-13 DIAGNOSIS — R0609 Other forms of dyspnea: Secondary | ICD-10-CM | POA: Diagnosis not present

## 2022-03-13 LAB — ECHOCARDIOGRAM COMPLETE
Area-P 1/2: 3.3 cm2
P 1/2 time: 425 msec
S' Lateral: 2.9 cm

## 2022-05-14 ENCOUNTER — Telehealth: Payer: Self-pay | Admitting: Cardiovascular Disease

## 2022-05-14 DIAGNOSIS — R0602 Shortness of breath: Secondary | ICD-10-CM

## 2022-05-14 NOTE — Telephone Encounter (Signed)
Pt c/o Shortness Of Breath: STAT if SOB developed within the last 24 hours or pt is noticeably SOB on the phone  1. Are you currently SOB (can you hear that pt is SOB on the phone)? Unsure   2. How long have you been experiencing SOB? Since ablation   3. Are you SOB when sitting or when up moving around? Moving around   4. Are you currently experiencing any other symptoms? Not that she is aware of    Patient is scheduled to see Sharyn Lull on 06/22 regarding this.

## 2022-05-14 NOTE — Telephone Encounter (Signed)
Returned call to wife Hassan Rowan who states that patient is still having SOB. Pt was seen here in march of this year with same complaint and Burt Knack ordered ECHO which was normal. Per pt, he felt that Dr Burt Knack almost certainly ruled out heart problems as a cause for his shortness of breath. Wife is now asking if we will refer him to a pulmonologist for evaluation. Will route to MD for approval.

## 2022-05-15 ENCOUNTER — Ambulatory Visit: Payer: Medicare HMO | Admitting: Cardiovascular Disease

## 2022-05-16 NOTE — Telephone Encounter (Signed)
Please refer for pulmonary evaluation. thanks

## 2022-05-17 ENCOUNTER — Ambulatory Visit: Payer: Medicare HMO | Admitting: Nurse Practitioner

## 2022-05-17 NOTE — Addendum Note (Signed)
Addended by: Ma Hillock on: 05/17/2022 09:21 AM   Modules accepted: Orders

## 2022-05-17 NOTE — Telephone Encounter (Signed)
Called home number and spoke with wife Hassan Rowan (on [place]) to inform her that referral to pulmonology had been placed and someone will be in contact to schedule the appt. No questions at this time.

## 2022-08-15 DIAGNOSIS — I25810 Atherosclerosis of coronary artery bypass graft(s) without angina pectoris: Secondary | ICD-10-CM | POA: Diagnosis not present

## 2022-08-15 DIAGNOSIS — Z23 Encounter for immunization: Secondary | ICD-10-CM | POA: Diagnosis not present

## 2022-08-15 DIAGNOSIS — I10 Essential (primary) hypertension: Secondary | ICD-10-CM | POA: Diagnosis not present

## 2022-08-15 DIAGNOSIS — R7303 Prediabetes: Secondary | ICD-10-CM | POA: Diagnosis not present

## 2022-08-15 DIAGNOSIS — L989 Disorder of the skin and subcutaneous tissue, unspecified: Secondary | ICD-10-CM | POA: Diagnosis not present

## 2022-08-15 DIAGNOSIS — I5032 Chronic diastolic (congestive) heart failure: Secondary | ICD-10-CM | POA: Diagnosis not present

## 2022-08-15 DIAGNOSIS — E7800 Pure hypercholesterolemia, unspecified: Secondary | ICD-10-CM | POA: Diagnosis not present

## 2022-08-15 DIAGNOSIS — M48062 Spinal stenosis, lumbar region with neurogenic claudication: Secondary | ICD-10-CM | POA: Diagnosis not present

## 2022-08-22 DIAGNOSIS — L821 Other seborrheic keratosis: Secondary | ICD-10-CM | POA: Diagnosis not present

## 2022-08-22 DIAGNOSIS — D044 Carcinoma in situ of skin of scalp and neck: Secondary | ICD-10-CM | POA: Diagnosis not present

## 2022-08-22 DIAGNOSIS — C44722 Squamous cell carcinoma of skin of right lower limb, including hip: Secondary | ICD-10-CM | POA: Diagnosis not present

## 2022-11-05 DIAGNOSIS — Z936 Other artificial openings of urinary tract status: Secondary | ICD-10-CM | POA: Diagnosis not present

## 2023-01-03 ENCOUNTER — Encounter (HOSPITAL_COMMUNITY): Payer: Self-pay | Admitting: *Deleted

## 2023-01-22 DIAGNOSIS — D2261 Melanocytic nevi of right upper limb, including shoulder: Secondary | ICD-10-CM | POA: Diagnosis not present

## 2023-01-22 DIAGNOSIS — C44722 Squamous cell carcinoma of skin of right lower limb, including hip: Secondary | ICD-10-CM | POA: Diagnosis not present

## 2023-01-22 DIAGNOSIS — L814 Other melanin hyperpigmentation: Secondary | ICD-10-CM | POA: Diagnosis not present

## 2023-01-22 DIAGNOSIS — L821 Other seborrheic keratosis: Secondary | ICD-10-CM | POA: Diagnosis not present

## 2023-01-22 DIAGNOSIS — L570 Actinic keratosis: Secondary | ICD-10-CM | POA: Diagnosis not present

## 2023-01-22 DIAGNOSIS — D1801 Hemangioma of skin and subcutaneous tissue: Secondary | ICD-10-CM | POA: Diagnosis not present

## 2023-01-22 DIAGNOSIS — Z85828 Personal history of other malignant neoplasm of skin: Secondary | ICD-10-CM | POA: Diagnosis not present

## 2023-01-22 DIAGNOSIS — D225 Melanocytic nevi of trunk: Secondary | ICD-10-CM | POA: Diagnosis not present

## 2023-01-22 DIAGNOSIS — D2262 Melanocytic nevi of left upper limb, including shoulder: Secondary | ICD-10-CM | POA: Diagnosis not present

## 2023-02-19 DIAGNOSIS — C44722 Squamous cell carcinoma of skin of right lower limb, including hip: Secondary | ICD-10-CM | POA: Diagnosis not present

## 2023-03-07 DIAGNOSIS — E7800 Pure hypercholesterolemia, unspecified: Secondary | ICD-10-CM | POA: Diagnosis not present

## 2023-03-07 DIAGNOSIS — R5383 Other fatigue: Secondary | ICD-10-CM | POA: Diagnosis not present

## 2023-03-07 DIAGNOSIS — R0609 Other forms of dyspnea: Secondary | ICD-10-CM | POA: Diagnosis not present

## 2023-03-07 DIAGNOSIS — M48062 Spinal stenosis, lumbar region with neurogenic claudication: Secondary | ICD-10-CM | POA: Diagnosis not present

## 2023-03-07 DIAGNOSIS — Z0000 Encounter for general adult medical examination without abnormal findings: Secondary | ICD-10-CM | POA: Diagnosis not present

## 2023-03-07 DIAGNOSIS — Z79899 Other long term (current) drug therapy: Secondary | ICD-10-CM | POA: Diagnosis not present

## 2023-03-07 DIAGNOSIS — I5032 Chronic diastolic (congestive) heart failure: Secondary | ICD-10-CM | POA: Diagnosis not present

## 2023-03-07 DIAGNOSIS — R7303 Prediabetes: Secondary | ICD-10-CM | POA: Diagnosis not present

## 2023-03-07 DIAGNOSIS — I110 Hypertensive heart disease with heart failure: Secondary | ICD-10-CM | POA: Diagnosis not present

## 2023-03-07 DIAGNOSIS — I10 Essential (primary) hypertension: Secondary | ICD-10-CM | POA: Diagnosis not present

## 2023-03-07 DIAGNOSIS — I25810 Atherosclerosis of coronary artery bypass graft(s) without angina pectoris: Secondary | ICD-10-CM | POA: Diagnosis not present

## 2023-03-27 ENCOUNTER — Encounter: Payer: Self-pay | Admitting: Cardiovascular Disease

## 2023-03-27 ENCOUNTER — Ambulatory Visit: Payer: Medicare HMO | Attending: Cardiovascular Disease | Admitting: Cardiovascular Disease

## 2023-03-27 VITALS — BP 130/70 | HR 76 | Ht 71.0 in | Wt 273.0 lb

## 2023-03-27 DIAGNOSIS — R0609 Other forms of dyspnea: Secondary | ICD-10-CM

## 2023-03-27 DIAGNOSIS — E782 Mixed hyperlipidemia: Secondary | ICD-10-CM | POA: Diagnosis not present

## 2023-03-27 DIAGNOSIS — I10 Essential (primary) hypertension: Secondary | ICD-10-CM | POA: Diagnosis not present

## 2023-03-27 DIAGNOSIS — I2510 Atherosclerotic heart disease of native coronary artery without angina pectoris: Secondary | ICD-10-CM | POA: Diagnosis not present

## 2023-03-27 NOTE — Patient Instructions (Signed)
Medication Instructions:  Your physician recommends that you continue on your current medications as directed. Please refer to the Current Medication list given to you today.  *If you need a refill on your cardiac medications before your next appointment, please call your pharmacy*   Lab Work: NONE If you have labs (blood work) drawn today and your tests are completely normal, you will receive your results only by: MyChart Message (if you have MyChart) OR A paper copy in the mail If you have any lab test that is abnormal or we need to change your treatment, we will call you to review the results.   Testing/Procedures: NONE   Follow-Up: At  [facility]Care, you and your health needs are our priority.  As part of our continuing mission to provide you with exceptional heart care, we have created designated Provider Care Teams.  These Care Teams include your primary Cardiologist (physician) and Advanced Practice Providers (APPs -  Physician Assistants and Nurse Practitioners) who all work together to provide you with the care you need, when you need it.  We recommend signing up for the patient portal called "MyChart".  Sign up information is provided on this After Visit Summary.  MyChart is used to connect with patients for Virtual Visits (Telemedicine).  Patients are able to view lab/test results, encounter notes, upcoming appointments, etc.  Non-urgent messages can be sent to your provider as well.   To learn more about what you can do with MyChart, go to [email or web address].    Your next appointment:   1 year(s)  Provider:   Michael Cooper, MD      

## 2023-03-27 NOTE — Progress Notes (Signed)
Cardiology Office Note:    Date:  03/27/2023   ID:  Allen James, DOB October 29, 1941, MRN [id number]  PCP:  Aliene Beams, MD   [facility] Providers Cardiologist:  Tonny Bollman, MD     Referring MD: Aliene Beams, MD   Chief Complaint  Patient presents with   Coronary Artery Disease    History of Present Illness:    Allen James is a 82 y.o. male with a hx of CAD s/p CABG in 2019, presenting for follow-up evaluation.  During last year's evaluation he was found to have atrial flutter.  He ultimately underwent a flutter ablation with success.  This significantly improved his exercise tolerance.  Stress Myoview was performed and showed no ischemia.  At the time of his visit 1 year ago he complained of exertional dyspnea.  An echocardiogram was done and showed normal LV function and no significant valvular disease.  He thinks his shortness of breath is likely related to deconditioning and being overweight.  He does not have orthopnea, PND, or leg swelling.  He not required any use of Lasix in the past year.  He denies any chest pain or chest pressure.  Overall doing pretty well.  Past Medical History:  Diagnosis Date   Abdominal hernia    pt. states he had it for 10 yrs or longer   Abnormal findings on cardiac catheterization    ! Do NOT use RIGHT radial access on future caths or PCI.   Acute non-ST elevation myocardial infarction (NSTEMI) (HCC) 04/12/2014   Acute non-ST segment elevation myocardial infarction (HCC) 04/12/2014   Adiposity 04/14/2014   AKI (acute kidney injury) (HCC) 04/14/2014   Anginal pain (HCC)    Arthritis    Back pain    Benign essential hypertension 12/20/2016   CAD (coronary artery disease)    a. NSTEMI 04/12/14 s/p DES x 2 to RCA. Prox 3.5x38 mm Xience Alpine DES, Dist 3.5x28 mm Xience DES; EF 65-70%. Staged PCI to the OM1 with 2.75 x 12 mm Resolute DES      Chronic coronary artery disease 04/14/2014   Chronic lower back pain 07/13/2014   Dyspepsia  03/07/2016   Dyspnea    GERD (gastroesophageal reflux disease)    H/O hiatal hernia    Hyperlipidemia    Injury of kidney 04/14/2014   Muscle soreness 04/27/2014   NSTEMI (non-ST elevated myocardial infarction) (HCC) 04/12/2014   Obesity 04/14/2014   Obstructive apnea 12/28/2014   OSA on CPAP    Osteoarthrosis 09/30/2014   Primary insomnia 03/07/2016   Pure hypercholesterolemia 12/28/2014   SLEEP APNEA 02/24/2009   Qualifier: Diagnosis of  By: Maple Hudson MD, Rennis Chris     Past Surgical History:  Procedure Laterality Date   A-FLUTTER ABLATION N/A 04/17/2021   Procedure: A-FLUTTER ABLATION;  Surgeon: Marinus Maw, MD;  Location: [facility] INVASIVE CV LAB;  Service: Cardiovascular;  Laterality: N/A;   APPENDECTOMY     Remote   CARPAL TUNNEL RELEASE Bilateral    CORONARY ANGIOPLASTY  04/12/14   STENT TO RCA   CORONARY ARTERY BYPASS GRAFT N/A 06/06/2018   Procedure: CORONARY ARTERY BYPASS GRAFTING (CABG) x 3 WITH ENDOSCOPIC HARVESTING OF RIGHT SAPHENOUS VEIN;  Surgeon: Kerin Perna, MD;  Location[facility] OR;  Service: Open Heart Surgery;  Laterality: N/A;   KNEE ARTHROSCOPY Bilateral    LEFT HEART CATH AND CORONARY ANGIOGRAPHY N/A 05/30/2018   Procedure: LEFT HEART CATH AND CORONARY ANGIOGRAPHY;  Surgeon: Tonny Bollman, MD;  Location: [facility]  INVASIVE CV LAB;  Service: Cardiovascular;  Laterality: N/A;   LEFT HEART CATHETERIZATION WITH CORONARY ANGIOGRAM N/A 04/12/2014   Procedure: LEFT HEART CATHETERIZATION WITH CORONARY ANGIOGRAM;  Surgeon: Micheline Chapman, MD;  Location: [facility] CATH LAB;  Service: Cardiovascular;  Laterality: N/A;   PERCUTANEOUS CORONARY STENT INTERVENTION (PCI-S)  04/12/2014   Procedure: PERCUTANEOUS CORONARY STENT INTERVENTION (PCI-S);  Surgeon: Micheline Chapman, MD;  Location[facility] CATH LAB;  Service: Cardiovascular;;   PERCUTANEOUS CORONARY STENT INTERVENTION (PCI-S) N/A 04/14/2014   Procedure: PERCUTANEOUS CORONARY STENT INTERVENTION (PCI-S);  Surgeon: Peter M Swaziland, MD;  Location: [facility] CATH LAB;   Service: Cardiovascular;  Laterality: N/A;   TEE WITHOUT CARDIOVERSION N/A 06/06/2018   Procedure: TRANSESOPHAGEAL ECHOCARDIOGRAM (TEE);  Surgeon: Donata Clay, Theron Arista, MD;  Location: [facility] OR;  Service: Open Heart Surgery;  Laterality: N/A;    Current Medications: Current Meds  Medication Sig   Alpha-D-Galactosidase (BEANO ULTRA 800) TABS    amLODipine (NORVASC) 5 MG tablet Take 2.5 mg by mouth daily.   aspirin EC 81 MG tablet Take 1 tablet (81 mg total) by mouth daily. Swallow whole.   atorvastatin (LIPITOR) 40 MG tablet Take 1 tablet (40 mg total) by mouth daily at 6 PM.   diclofenac Sodium (VOLTAREN) 1 % GEL Apply 1 application topically 4 (four) times daily as needed (pain).   furosemide (LASIX) 20 MG tablet Take 1 tablet by mouth daily for 3 days then only as needed for swelling (Patient taking differently: Take by mouth daily as needed for edema.)   HYDROcodone-acetaminophen (NORCO/VICODIN) 5-325 MG per tablet Take 1 tablet by mouth as needed for moderate pain. Per patient taking 1 tablet as needed   meloxicam (MOBIC) 15 MG tablet Take 1 tablet by mouth daily.   metoprolol succinate (TOPROL-XL) 50 MG 24 hr tablet Take 1 tablet in the AM and 1/2 tablet in the PM (Patient taking differently: Take 25-50 mg by mouth See admin instructions. Take 50 mg in the morning and 25 mg in the evening)   traMADol (ULTRAM) 50 MG tablet Take 2 tablets (100 mg total) by mouth 2 (two) times daily. (Patient taking differently: Take 150 mg by mouth as needed.)   [DISCONTINUED] doxycycline (VIBRAMYCIN) 100 MG capsule Take 100 mg by mouth 2 (two) times daily.   [DISCONTINUED] meloxicam (MOBIC) 15 MG tablet Take 7.5 mg by mouth daily.     Allergies:   Antihistamines, diphenhydramine-type   Social History   Socioeconomic History   Marital status: Married    Spouse name: Not on file   Number of children: Not on file   Years of education: Not on file   Highest education level: Not on file  Occupational History    Not on file  Tobacco Use   Smoking status: Former    Packs/day: 1.00    Years: 20.00    Additional pack years: 0.00    Total pack years: 20.00    Types: Cigarettes    Quit date: 11/26/1978    Years since quitting: 44.3   Smokeless tobacco: Never  Substance and Sexual Activity   Alcohol use: No   Drug use: No   Sexual activity: Not on file  Other Topics Concern   Not on file  Social History Narrative   Not on file   Social Determinants of Health   Financial Resource Strain: Not on file  Food Insecurity: Not on file  Transportation Needs: Not on file  Physical Activity: Not on file  Stress: Not on file  Social Connections: Not on file     Family History: The patient's family history includes Coronary artery disease (age of onset: 68) in his mother; Emphysema in his father.  ROS:   Please see the history of present illness.    All other systems reviewed and are negative.  EKGs/Labs/Other Studies Reviewed:    The following studies were reviewed today: Cardiac Studies & Procedures   CARDIAC CATHETERIZATION  CARDIAC CATHETERIZATION 05/30/2018  Narrative  The left ventricular systolic function is normal.  LV end diastolic pressure is mildly elevated.  The left ventricular ejection fraction is 55-65% by visual estimate.  Ost LAD lesion is 90% stenosed.  Prox LAD lesion is 50% stenosed.  Mid LAD lesion is 40% stenosed.  2nd Diag lesion is 40% stenosed.  Ost 1st Mrg lesion is 50% stenosed.  Previously placed 1st Mrg stent (unknown type) is widely patent.  Previously placed Prox RCA to Mid RCA stent (unknown type) is widely patent.  Previously placed Mid RCA to Dist RCA stent (unknown type) is widely patent.  Mid RCA lesion is 50% stenosed.  1.  Severe ostial LAD stenosis with mild to moderate proximal and mid LAD stenosis 2.  Patent left main with no significant stenosis 3.  Patent left circumflex stent (first OM) with moderate stenosis proximal to the  stented segment 4.  Patent RCA stents with mild to moderate stenosis just between the stents in the mid RCA 5.  Normal LV function  Recommendations: Recommend cardiac surgical consultation for consideration of CABG.  Patient has diffuse coronary artery disease and severe stenosis involving the true ostium of the LAD.  I think he would benefit from surgical revascularization.  He will be instructed to discontinue clopidogrel in anticipation of CABG.  Will refer for outpatient surgical consult.    Findings Coronary Findings Diagnostic  Dominance: Right  Left Anterior Descending The LAD is diffusely diseased.  The ostium has 90% stenosis followed by mild to moderate diffuse stenosis in the mid vessel.  The diagonal branches are patent with no high-grade stenoses. Ost LAD lesion is 90% stenosed. True ostial lesion, eccentric stenosis estimated at 90%. Prox LAD lesion is 50% stenosed. Mid LAD lesion is 40% stenosed.  Second Diagonal Branch 2nd Diag lesion is 40% stenosed.  Left Circumflex  First Obtuse Marginal Branch Ost 1st Mrg lesion is 50% stenosed. The lesion is located just proximal to a patent stent in the first OM Previously placed 1st Mrg stent (unknown type) is widely patent.  Right Coronary Artery Previously placed Prox RCA to Mid RCA stent (unknown type) is widely patent. Mid RCA lesion is 50% stenosed. Previously placed Mid RCA to Dist RCA stent (unknown type) is widely patent.  Intervention  No interventions have been documented.   STRESS TESTS  MYOCARDIAL PERFUSION IMAGING 03/14/2021  Narrative  The left ventricular ejection fraction is mildly decreased (45-54%) though imaging performed in atrial flutter.  Nuclear stress EF: 46%.  There was no ST segment deviation noted during stress.  No T wave inversion was noted during stress.  This is an intermediate risk study in the setting of decrease LVEF  IMPRESSIONS Atrial Flutter during exam. Sensitivity for  accurate LVEF is decreased in the setting of arrhythmia.  RECOMMENDATIONS/CONCLUSIONS Stress test is negative for ischemic or infarction. The study is at most an intermediate risk study because of LVEF as above. Compared to 2018 study, no change in perfusion.   ECHOCARDIOGRAM  ECHOCARDIOGRAM COMPLETE 03/13/2022  Narrative ECHOCARDIOGRAM REPORT  Patient Name:   ODEAN FESTER Date of Exam: 03/13/2022 Medical Rec #:  [id number]        Height:       71.0 in Accession #:    [id number]       Weight:       276.8 lb Date of Birth:  [birth year]         BSA:          2.421 m Patient Age:    80 years         BP:           116/68 mmHg Patient Gender: M                HR:           67 bpm. Exam Location:  [address]  Procedure: 2D Echo, Cardiac Doppler, Color Doppler and Strain Analysis  Indications:    R06.09 SOB  History:        Patient has prior history of Echocardiogram examinations, most recent 11/24/2019. CAD and NSTEMI, Prior CABG, Arrythmias:Atrial Fibrillation; Risk Factors:Sleep Apnea and HLD, AKI.  Sonographer:    Clearence Ped RCS Referring Phys: [phone] Zandyr Barnhill  IMPRESSIONS   1. Left ventricular ejection fraction, by estimation, is 55 to 60%. The left ventricle has normal function. The left ventricle has no regional wall motion abnormalities. There is mild left ventricular hypertrophy. Left ventricular diastolic parameters are consistent with Grade I diastolic dysfunction (impaired relaxation). The average left ventricular global longitudinal strain is -18.8 %. The global longitudinal strain is normal. 2. Right ventricular systolic function is normal. The right ventricular size is normal. 3. Left atrial size was mildly dilated. 4. The mitral valve is normal in structure. Trivial mitral valve regurgitation. No evidence of mitral stenosis. 5. The aortic valve is tricuspid. Aortic valve regurgitation is mild. Aortic valve sclerosis/calcification is present, without any  evidence of aortic stenosis. 6. Aortic dilatation noted. There is borderline dilatation of the aortic root, measuring 39 mm. There is mild dilatation of the ascending aorta, measuring 40 mm.  FINDINGS Left Ventricle: Left ventricular ejection fraction, by estimation, is 55 to 60%. The left ventricle has normal function. The left ventricle has no regional wall motion abnormalities. The average left ventricular global longitudinal strain is -18.8 %. The global longitudinal strain is normal. The left ventricular internal cavity size was normal in size. There is mild left ventricular hypertrophy. Left ventricular diastolic parameters are consistent with Grade I diastolic dysfunction (impaired relaxation).  Right Ventricle: The right ventricular size is normal. Right ventricular systolic function is normal.  Left Atrium: Left atrial size was mildly dilated.  Right Atrium: Right atrial size was normal in size.  Pericardium: There is no evidence of pericardial effusion.  Mitral Valve: The mitral valve is normal in structure. Mild mitral annular calcification. Trivial mitral valve regurgitation. No evidence of mitral valve stenosis.  Tricuspid Valve: The tricuspid valve is normal in structure. Tricuspid valve regurgitation is trivial. No evidence of tricuspid stenosis.  Aortic Valve: The aortic valve is tricuspid. Aortic valve regurgitation is mild. Aortic regurgitation PHT measures 425 msec. Aortic valve sclerosis/calcification is present, without any evidence of aortic stenosis.  Pulmonic Valve: The pulmonic valve was normal in structure. Pulmonic valve regurgitation is not visualized. No evidence of pulmonic stenosis.  Aorta: Aortic dilatation noted. There is borderline dilatation of the aortic root, measuring 39 mm. There is mild dilatation of the ascending aorta, measuring 40 mm.  Venous: The inferior vena  cava was not well visualized.  IAS/Shunts: The interatrial septum was not well  visualized.   LEFT VENTRICLE PLAX 2D LVIDd:         4.60 cm   Diastology LVIDs:         2.90 cm   LV e' medial:    7.72 cm/s LV PW:         1.30 cm   LV E/e' medial:  11.9 LV IVS:        1.20 cm   LV e' lateral:   5.55 cm/s LVOT diam:     1.70 cm   LV E/e' lateral: 16.6 LV SV:         87 LV SV Index:   36        2D Longitudinal Strain LVOT Area:     2.27 cm  2D Strain GLS (A2C):   -15.2 % 2D Strain GLS (A3C):   -18.1 % 2D Strain GLS (A4C):   -23.1 % 2D Strain GLS Avg:     -18.8 %  RIGHT VENTRICLE RV Basal diam:  4.10 cm RV Mid diam:    2.60 cm RV S prime:     7.51 cm/s TAPSE (M-mode): 1.7 cm RVSP:           29.6 mmHg  LEFT ATRIUM             Index        RIGHT ATRIUM           Index LA diam:        5.10 cm 2.11 cm/m   RA Pressure: 3.00 mmHg LA Vol (A2C):   88.4 ml 36.52 ml/m  RA Area:     18.60 cm LA Vol (A4C):   70.4 ml 29.08 ml/m  RA Volume:   51.80 ml  21.40 ml/m LA Biplane Vol: 77.3 ml 31.93 ml/m AORTIC VALVE LVOT Vmax:   155.00 cm/s LVOT Vmean:  114.000 cm/s LVOT VTI:    0.383 m AI PHT:      425 msec  AORTA Ao Root diam: 3.90 cm Ao Asc diam:  4.00 cm  MITRAL VALVE               TRICUSPID VALVE MV Area (PHT):             TR Peak grad:   26.6 mmHg MV Decel Time:             TR Vmax:        258.00 cm/s MV E velocity: 92.20 cm/s  Estimated RAP:  3.00 mmHg MV A velocity: 84.40 cm/s  RVSP:           29.6 mmHg MV E/A ratio:  1.09 SHUNTS Systemic VTI:  0.38 m Systemic Diam: 1.70 cm  Olga Millers MD Electronically signed by Olga Millers MD Signature Date/Time: 03/13/2022/2:00:13 PM    Final   TEE  ECHO TEE 06/06/2018  Interpretation Summary  Septum: No Patent Foramen Ovale present. Lipomatous atrial septal hypertrophy present.  Left atrium: Lipomatous hypertrophy (normal variant) of the interatrial septum is noted. Patent foramen ovale not present.  Aortic valve: Mild valve thickening present. Mild valve calcification present. No stenosis. Mild  to moderate regurgitation.  Mitral valve: Mild mitral annular calcification. Mild regurgitation.  Pericardium: Trivial circumferential pericardial effusion.             EKG:  EKG is ordered today.  The ekg ordered today demonstrates normal sinus rhythm 76 bpm, first-degree AV block, ST and  T wave abnormality consider anterolateral ischemia.  Recent Labs: No results found for requested labs within last 365 days.  Recent Lipid Panel    Component Value Date/Time   CHOL 140 06/15/2020 1529   TRIG 156 (H) 06/15/2020 1529   HDL 45 06/15/2020 1529   CHOLHDL 3.1 06/15/2020 1529   CHOLHDL 4.7 05/03/2016 0859   VLDL 29 05/03/2016 0859   LDLCALC 68 06/15/2020 1529     Risk Assessment/Calculations:                Physical Exam:    VS:  BP 130/70   Pulse 76   Ht 5\' 11"  (1.803 m)   Wt 273 lb (123.8 kg)   SpO2 95%   BMI 38.08 kg/m     Wt Readings from Last 3 Encounters:  03/27/23 273 lb (123.8 kg)  02/23/22 276 lb 12.8 oz (125.6 kg)  05/17/21 264 lb 6.4 oz (119.9 kg)     GEN:  Well nourished, well developed obese male in no acute distress HEENT: Normal NECK: No JVD; No carotid bruits LYMPHATICS: No lymphadenopathy CARDIAC: RRR, no murmurs, rubs, gallops RESPIRATORY:  Clear to auscultation without rales, wheezing or rhonchi  ABDOMEN: Soft, non-tender, non-distended MUSCULOSKELETAL: Trace bilateral pretibial edema; No deformity  SKIN: Warm and dry NEUROLOGIC:  Alert and oriented x 3 PSYCHIATRIC:  Normal affect   ASSESSMENT:    1. Mixed hyperlipidemia   2. Coronary artery disease involving native coronary artery of native heart without angina pectoris   3. DOE (dyspnea on exertion)   4. Essential hypertension    PLAN:    In order of problems listed above:  Treated with atorvastatin 40 mg daily.  Lifestyle modification discussed with the focus on weight loss.  Cholesterol is 138, HDL 42, LDL 73. Stable with no symptoms of angina.  Continue aspirin for  antiplatelet therapy, amlodipine and metoprolol for antianginal therapy, and high intensity statin drug. Evaluated in the past, no structural heart disease noted with normal LVEF and no valvular problems.  When he had coronary issues in the past, he experienced angina.  He is not having any chest pain or pressure now.  He has been able to lose weight in the spring and summer but always gains weight over the winter.  His weight is up about 10 pounds over the past few years.  He will work on lifestyle modification. Well-controlled on amlodipine and metoprolol succinate.     Medication Adjustments/Labs and Tests Ordered: Current medicines are reviewed at length with the patient today.  Concerns regarding medicines are outlined above.  Orders Placed This Encounter  Procedures   EKG 12-Lead   No orders of the defined types were placed in this encounter.   Patient Instructions  Medication Instructions:  Your physician recommends that you continue on your current medications as directed. Please refer to the Current Medication list given to you today.  *If you need a refill on your cardiac medications before your next appointment, please call your pharmacy*   Lab Work: NONE If you have labs (blood work) drawn today and your tests are completely normal, you will receive your results only by: MyChart Message (if you have MyChart) OR A paper copy in the mail If you have any lab test that is abnormal or we need to change your treatment, we will call you to review the results.   Testing/Procedures: NONE   Follow-Up: At [facility], you and your health needs are our priority.  As part  of our continuing mission to provide you with exceptional heart care, we have created designated Provider Care Teams.  These Care Teams include your primary Cardiologist (physician) and Advanced Practice Providers (APPs -  Physician Assistants and Nurse Practitioners) who all work together to provide you  with the care you need, when you need it.  We recommend signing up for the patient portal called "MyChart".  Sign up information is provided on this After Visit Summary.  MyChart is used to connect with patients for Virtual Visits (Telemedicine).  Patients are able to view lab/test results, encounter notes, upcoming appointments, etc.  Non-urgent messages can be sent to your provider as well.   To learn more about what you can do with MyChart, go to [email or web address].    Your next appointment:   1 year(s)  Provider:   Tonny Bollman, MD        Signed, Tonny Bollman, MD  03/27/2023 1:12 PM    [facility]

## 2023-04-08 DIAGNOSIS — D696 Thrombocytopenia, unspecified: Secondary | ICD-10-CM | POA: Diagnosis not present

## 2023-06-13 DIAGNOSIS — I483 Typical atrial flutter: Secondary | ICD-10-CM | POA: Diagnosis not present

## 2023-06-13 DIAGNOSIS — I25810 Atherosclerosis of coronary artery bypass graft(s) without angina pectoris: Secondary | ICD-10-CM | POA: Diagnosis not present

## 2023-06-13 DIAGNOSIS — R69 Illness, unspecified: Secondary | ICD-10-CM | POA: Diagnosis not present

## 2023-06-13 DIAGNOSIS — M48062 Spinal stenosis, lumbar region with neurogenic claudication: Secondary | ICD-10-CM | POA: Diagnosis not present

## 2023-06-13 DIAGNOSIS — I10 Essential (primary) hypertension: Secondary | ICD-10-CM | POA: Diagnosis not present

## 2024-03-26 DIAGNOSIS — I5032 Chronic diastolic (congestive) heart failure: Secondary | ICD-10-CM | POA: Diagnosis not present

## 2024-03-26 DIAGNOSIS — D7589 Other specified diseases of blood and blood-forming organs: Secondary | ICD-10-CM | POA: Diagnosis not present

## 2024-03-26 DIAGNOSIS — Z7409 Other reduced mobility: Secondary | ICD-10-CM | POA: Diagnosis not present

## 2024-04-03 DIAGNOSIS — E538 Deficiency of other specified B group vitamins: Secondary | ICD-10-CM | POA: Diagnosis not present

## 2024-04-10 DIAGNOSIS — E538 Deficiency of other specified B group vitamins: Secondary | ICD-10-CM | POA: Diagnosis not present

## 2024-04-17 DIAGNOSIS — E538 Deficiency of other specified B group vitamins: Secondary | ICD-10-CM | POA: Diagnosis not present

## 2024-04-24 DIAGNOSIS — E538 Deficiency of other specified B group vitamins: Secondary | ICD-10-CM | POA: Diagnosis not present

## 2024-05-25 DIAGNOSIS — E538 Deficiency of other specified B group vitamins: Secondary | ICD-10-CM | POA: Diagnosis not present

## 2024-06-30 DIAGNOSIS — M48062 Spinal stenosis, lumbar region with neurogenic claudication: Secondary | ICD-10-CM | POA: Diagnosis not present

## 2024-06-30 DIAGNOSIS — E7800 Pure hypercholesterolemia, unspecified: Secondary | ICD-10-CM | POA: Diagnosis not present

## 2024-06-30 DIAGNOSIS — I25810 Atherosclerosis of coronary artery bypass graft(s) without angina pectoris: Secondary | ICD-10-CM | POA: Diagnosis not present

## 2024-06-30 DIAGNOSIS — I10 Essential (primary) hypertension: Secondary | ICD-10-CM | POA: Diagnosis not present

## 2024-06-30 DIAGNOSIS — E538 Deficiency of other specified B group vitamins: Secondary | ICD-10-CM | POA: Diagnosis not present

## 2024-07-31 DIAGNOSIS — E538 Deficiency of other specified B group vitamins: Secondary | ICD-10-CM | POA: Diagnosis not present

## 2024-08-31 DIAGNOSIS — Z23 Encounter for immunization: Secondary | ICD-10-CM | POA: Diagnosis not present

## 2024-08-31 DIAGNOSIS — E538 Deficiency of other specified B group vitamins: Secondary | ICD-10-CM | POA: Diagnosis not present

## 2024-09-30 DIAGNOSIS — E538 Deficiency of other specified B group vitamins: Secondary | ICD-10-CM | POA: Diagnosis not present

## 2024-10-20 ENCOUNTER — Ambulatory Visit: Admitting: Cardiovascular Disease

## 2024-10-22 ENCOUNTER — Emergency Department (HOSPITAL_COMMUNITY)
Admission: EM | Admit: 2024-10-22 | Discharge: 2024-10-22 | Disposition: A | Discharge status: Home / Self Care | Attending: Emergency Medicine | Admitting: Emergency Medicine

## 2024-10-22 ENCOUNTER — Encounter (HOSPITAL_COMMUNITY): Payer: Self-pay

## 2024-10-22 ENCOUNTER — Emergency Department (HOSPITAL_COMMUNITY)

## 2024-10-22 DIAGNOSIS — K5900 Constipation, unspecified: Secondary | ICD-10-CM | POA: Diagnosis not present

## 2024-10-22 DIAGNOSIS — Z7982 Long term (current) use of aspirin: Secondary | ICD-10-CM | POA: Diagnosis not present

## 2024-10-22 DIAGNOSIS — I10 Essential (primary) hypertension: Secondary | ICD-10-CM | POA: Diagnosis not present

## 2024-10-22 LAB — CBC WITH DIFFERENTIAL/PLATELET
Abs Immature Granulocytes: 0.02 K/uL (ref 0.00–0.07)
Basophils Absolute: 0 K/uL (ref 0.0–0.1)
Basophils Relative: 1 %
Eosinophils Absolute: 0.1 K/uL (ref 0.0–0.5)
Eosinophils Relative: 1 %
HCT: 42.5 % (ref 39.0–52.0)
Hemoglobin: 14.5 g/dL (ref 13.0–17.0)
Immature Granulocytes: 0 %
Lymphocytes Relative: 17 %
Lymphs Abs: 1.4 K/uL (ref 0.7–4.0)
MCH: 33.3 pg (ref 26.0–34.0)
MCHC: 34.1 g/dL (ref 30.0–36.0)
MCV: 97.7 fL (ref 80.0–100.0)
Monocytes Absolute: 0.7 K/uL (ref 0.1–1.0)
Monocytes Relative: 8 %
Neutro Abs: 6 K/uL (ref 1.7–7.7)
Neutrophils Relative %: 73 %
Platelets: 169 K/uL (ref 150–400)
RBC: 4.35 MIL/uL (ref 4.22–5.81)
RDW: 12.3 % (ref 11.5–15.5)
WBC: 8.2 K/uL (ref 4.0–10.5)
nRBC: 0 % (ref 0.0–0.2)

## 2024-10-22 LAB — URINALYSIS, ROUTINE W REFLEX MICROSCOPIC
Bilirubin Urine: NEGATIVE
Glucose, UA: NEGATIVE mg/dL
Hgb urine dipstick: NEGATIVE
Ketones, ur: NEGATIVE mg/dL
Leukocytes,Ua: NEGATIVE
Nitrite: NEGATIVE
Protein, ur: NEGATIVE mg/dL
Specific Gravity, Urine: 1.009 (ref 1.005–1.030)
pH: 7 (ref 5.0–8.0)

## 2024-10-22 LAB — COMPREHENSIVE METABOLIC PANEL WITH GFR
ALT: 19 U/L (ref 0–44)
AST: 26 U/L (ref 15–41)
Albumin: 4.2 g/dL (ref 3.5–5.0)
Alkaline Phosphatase: 97 U/L (ref 38–126)
Anion gap: 11 (ref 5–15)
BUN: 13 mg/dL (ref 8–23)
CO2: 24 mmol/L (ref 22–32)
Calcium: 9.2 mg/dL (ref 8.9–10.3)
Chloride: 106 mmol/L (ref 98–111)
Creatinine, Ser: 1.09 mg/dL (ref 0.61–1.24)
GFR, Estimated: >60 mL/min (ref >60)
Glucose, Bld: 106 mg/dL — ABNORMAL HIGH (ref 70–99)
Potassium: 4.6 mmol/L (ref 3.5–5.1)
Sodium: 141 mmol/L (ref 135–145)
Total Bilirubin: 0.9 mg/dL (ref 0.0–1.2)
Total Protein: 7.6 g/dL (ref 6.5–8.1)

## 2024-10-22 LAB — LIPASE, BLOOD: Lipase: 42 U/L (ref 11–51)

## 2024-10-22 MED ORDER — POLYETHYLENE GLYCOL 3350 17 GM/SCOOP PO POWD: 17.0000 g | Freq: Every day | ORAL | 0 refills | Status: AC

## 2024-10-22 MED ORDER — IOHEXOL 300 MG/ML  SOLN
100.0000 mL | Freq: Once | INTRAMUSCULAR | Status: AC | PRN
  Administered 2024-10-22: 100 mL via INTRAVENOUS

## 2024-10-22 NOTE — ED Provider Notes (Signed)
 I provided a substantive portion of the care of this patient.  I personally made/approved the management plan for this patient and take responsibility for the patient management.      63 male presents with constipation.  Labs are reassuring here.  Abdominal CT shows no sign of obstruction.  Will give enema and reassess   Dasie Faden, MD 10/22/24 [phone]

## 2024-10-22 NOTE — ED Provider Notes (Signed)
 [place] EMERGENCY DEPARTMENT AT [facility] Provider Note   CSN: [id number] Arrival date & time: 10/22/24  1741     Patient presents with: Constipation   Allen James is a 83 y.o. male presents today for 2 weeks of constipation with associated rectal pain and hemorrhoids from straining.  Patient has tried stool softeners and other medications without relief.    Constipation      Prior to Admission medications   Medication Sig Start Date End Date Taking? Authorizing Provider  Alpha-D-Galactosidase (BEANO ULTRA 800) TABS Take 2 tablets by mouth See admin instructions. Swallow 2 tablets right before the first bite or immediately after the meal (up to 30 minutes after the first bite)   Yes [provider]  amLODipine  (NORVASC ) 5 MG tablet Take 5 mg by mouth daily. 01/29/20  Yes [provider]  aspirin  EC 81 MG tablet Take 1 tablet (81 mg total) by mouth daily. Swallow whole. 05/17/21  Yes Waddell Danelle ORN, MD  atorvastatin  (LIPITOR ) 40 MG tablet Take 1 tablet (40 mg total) by mouth daily at 6 PM. Patient taking differently: Take 40 mg by mouth daily. 07/15/18  Yes Army Katheryn BROCKS, NP  HYDROcodone -acetaminophen  (NORCO) 10-325 MG tablet Take 0.5 tablets by mouth 2 (two) times daily as needed (for pain).   Yes [provider]  meloxicam (MOBIC) 15 MG tablet Take 15 mg by mouth in the morning.   Yes [provider]  metoprolol  succinate (TOPROL -XL) 50 MG 24 hr tablet Take 1 tablet in the AM and 1/2 tablet in the PM Patient taking differently: Take 50 mg by mouth daily. 03/15/21  Yes Carroll, Donna C, NP  polyethylene glycol powder (GLYCOLAX /MIRALAX ) 17 GM/SCOOP powder Take 17 g by mouth daily. Dissolve 1 capful (17g) in 4-8 ounces of liquid and take by mouth daily. 10/22/24  Yes Francis Ileana SAILOR, PA-C  Pramoxine HCl (TUCKS HEMORRHOIDAL RE) Place 1 application  rectally See admin instructions. Tucks Triple Relief Hemorrhoidal Cream - Apply to  hemorrhoids (but NOT INSIDE OF the rectum) up to 6 times a day as needed for pain or swelling   Yes [provider]  traMADol  (ULTRAM ) 50 MG tablet Take 2 tablets (100 mg total) by mouth 2 (two) times daily. Patient taking differently: Take 50-100 mg by mouth 2 (two) times daily as needed (for pain). 07/23/18  Yes Obadiah Coy, MD  TUMS 500 MG chewable tablet Chew 1 tablet by mouth 2 (two) times daily as needed for indigestion or heartburn.   Yes [provider]  furosemide  (LASIX ) 20 MG tablet Take 1 tablet by mouth daily for 3 days then only as needed for swelling Patient not taking: Reported on 10/22/2024 03/22/21   Dow Arland BROCKS, NP  pantoprazole  (PROTONIX ) 40 MG tablet Take 1 tablet (40 mg total) by mouth daily. Patient not taking: Reported on 10/22/2024 05/19/18 10/22/24  Wonda Sharper, MD    Allergies: Diphenhydramine and Antihistamines, diphenhydramine-type    Review of Systems  Gastrointestinal:  Positive for constipation and rectal pain.    Updated Vital Signs BP (!) 149/83 (BP Location: Left Arm)   Pulse 68   Temp 98.3 F (36.8 C) (Oral)   Resp 20   Ht 5' 11 (1.803 m)   Wt 111.1 kg   SpO2 100%   BMI 34.17 kg/m   Physical Exam Vitals and nursing note reviewed. Exam conducted with a chaperone present.  Constitutional:      General: He is not in acute  distress.    Appearance: He is well-developed. He is not toxic-appearing.  HENT:     Head: Normocephalic and atraumatic.     Right Ear: External ear normal.     Left Ear: External ear normal.     Mouth/Throat:     Mouth: Mucous membranes are moist.     Pharynx: Oropharynx is clear.  Eyes:     Extraocular Movements: Extraocular movements intact.     Conjunctiva/sclera: Conjunctivae normal.  Cardiovascular:     Rate and Rhythm: Normal rate and regular rhythm.     Pulses: Normal pulses.     Heart sounds: Normal heart sounds. No murmur heard. Pulmonary:     Effort: Pulmonary effort is normal.  No respiratory distress.     Breath sounds: Normal breath sounds.  Abdominal:     General: There is no distension.     Palpations: Abdomen is soft.     Tenderness: There is abdominal tenderness.  Genitourinary:    Rectum: No anal fissure, external hemorrhoid or internal hemorrhoid.     Comments: Liquid stool noted around rectum Musculoskeletal:        General: No swelling.     Cervical back: Neck supple.  Skin:    General: Skin is warm and dry.     Capillary Refill: Capillary refill takes less than 2 seconds.  Neurological:     General: No focal deficit present.     Mental Status: He is alert and oriented to person, place, and time.  Psychiatric:        Mood and Affect: Mood normal.     (all labs ordered are listed, but only abnormal results are displayed) Labs Reviewed  COMPREHENSIVE METABOLIC PANEL WITH GFR - Abnormal; Notable for the following components:      Result Value   Glucose, Bld 106 (*)    All other components within normal limits  URINALYSIS, ROUTINE W REFLEX MICROSCOPIC - Abnormal; Notable for the following components:   Color, Urine STRAW (*)    All other components within normal limits  LIPASE, BLOOD  CBC WITH DIFFERENTIAL/PLATELET    EKG: None  Radiology: CT ABDOMEN PELVIS W CONTRAST Result Date: 10/22/2024 EXAM: CT ABDOMEN AND PELVIS WITH CONTRAST 10/22/2024 07:42:00 PM TECHNIQUE: CT of the abdomen and pelvis was performed with the administration of 100 mL of iohexol  (OMNIPAQUE ) 300 MG/ML solution. Multiplanar reformatted images are provided for review. Automated exposure control, iterative reconstruction, and/or weight-based adjustment of the mA/kV was utilized to reduce the radiation dose to as low as reasonably achievable. COMPARISON: None available. CLINICAL HISTORY: Abdominal pain, acute, nonlocalized; Bowel obstruction suspected. FINDINGS: LOWER CHEST: Small hiatal hernia. LIVER: Low density throughout the liver compatible with fatty infiltration.  GALLBLADDER AND BILE DUCTS: Gallbladder is unremarkable. No biliary ductal dilatation. SPLEEN: No acute abnormality. PANCREAS: No acute abnormality. ADRENAL GLANDS: No acute abnormality. KIDNEYS, URETERS AND BLADDER: 7 mm nonobstructing stone in the upper pole of the left kidney. No hydronephrosis. No perinephric or periureteral stranding. Urinary bladder is unremarkable. GI AND BOWEL: Stomach demonstrates no acute abnormality. Left colonic diverticulosis. No active diverticulitis. There is no bowel obstruction. PERITONEUM AND RETROPERITONEUM: No ascites. No free air. VASCULATURE: Aorta is normal in caliber. Aortic atherosclerosis. LYMPH NODES: No lymphadenopathy. REPRODUCTIVE ORGANS: No acute abnormality. BONES AND SOFT TISSUES: No acute osseous abnormality. No focal soft tissue abnormality. IMPRESSION: 1. No acute findings in the abdomen or pelvis. 2. Left colonic diverticulosis without evidence of diverticulitis. 3. Nonobstructing left upper pole nephrolithiasis. 4. Hepatic steatosis.  Electronically signed by: Franky Crease MD 10/22/2024 08:00 PM EST RP Workstation: [id number]     Procedures   Medications Ordered in the ED  iohexol  (OMNIPAQUE ) 300 MG/ML solution 100 mL (100 mLs Intravenous Contrast Given 10/22/24 1929)                                    Medical Decision Making  This patient presents to the ED for concern of constipation and rectal pain differential diagnosis includes hemorrhoids, anal fissure, impaction, SBO, constipation    Additional history obtained   Additional history obtained from Electronic Medical Record External records from outside source obtained and reviewed including Care Everywhere   Lab Tests:  I Ordered, and personally interpreted labs.  The pertinent results include: CBC unremarkable, CMP unremarkable, lipase 42, UA unremarkable   Imaging Studies ordered:  I ordered imaging studies including CT abdomen pelvis with contrast I independently  visualized and interpreted imaging which showed no acute findings in the abdomen or pelvis.  Left colonic diverticulosis without evidence of diverticulitis.  Nonobstructing left upper pole nephrolithiasis.  Hepatic steatosis. I agree with the radiologist interpretation   Medicines ordered and prescription drug management:  I ordered medication including soap suds enema    I have reviewed the patients home medicines and have made adjustments as needed   Problem List / ED Course:  Patient reporting some bowel movement since enema and requesting discharge home Considered for admission or further workup however patient's vital signs, physical exam, labs, and imaging are reassuring.  Patient started on MiraLAX  and given gastroenterology contact information in case symptoms persist.  Patient given return precautions.  I feel patient safer discharge at this time.       Final diagnoses:  Constipation, unspecified constipation type    ED Discharge Orders          Ordered    polyethylene glycol powder (GLYCOLAX /MIRALAX ) 17 GM/SCOOP powder  Daily        10/22/24 2205               Francis Ileana SAILOR, PA-C 10/22/24 2208    Dasie Faden, MD 10/24/24 1549

## 2024-10-22 NOTE — ED Triage Notes (Signed)
 Pt BIB ems for having constipation for 2 weeks now, that is causing rectal pain and hemorrhoids from trying to push. Has tried stool softeners and other meds. VS stable, A&Ox4

## 2024-10-22 NOTE — Discharge Instructions (Addendum)
 Today you were seen for constipation.  Your workup on the emergency department was reassuring.  You may take MiraLAX  as needed for constipation.  Please follow-up with gastroenterology if your symptoms persist.  Thank you for letting us  treat you today. After reviewing your labs and imaging, I feel you are safe to go home. Please follow up with your PCP in the next several days and provide them with your records from this visit. Return to the Emergency Room if pain becomes severe or symptoms worsen.

## 2024-11-04 DIAGNOSIS — I10 Essential (primary) hypertension: Secondary | ICD-10-CM | POA: Diagnosis not present

## 2024-11-04 DIAGNOSIS — I25810 Atherosclerosis of coronary artery bypass graft(s) without angina pectoris: Secondary | ICD-10-CM | POA: Diagnosis not present

## 2024-11-04 DIAGNOSIS — M48062 Spinal stenosis, lumbar region with neurogenic claudication: Secondary | ICD-10-CM | POA: Diagnosis not present

## 2024-11-04 DIAGNOSIS — R7303 Prediabetes: Secondary | ICD-10-CM | POA: Diagnosis not present

## 2024-11-04 DIAGNOSIS — D7589 Other specified diseases of blood and blood-forming organs: Secondary | ICD-10-CM | POA: Diagnosis not present

## 2024-11-04 DIAGNOSIS — E538 Deficiency of other specified B group vitamins: Secondary | ICD-10-CM | POA: Diagnosis not present

## 2024-12-07 ENCOUNTER — Encounter: Payer: Self-pay | Admitting: Cardiovascular Disease

## 2024-12-07 ENCOUNTER — Ambulatory Visit: Attending: Cardiovascular Disease | Admitting: Cardiovascular Disease

## 2024-12-07 VITALS — BP 110/46 | HR 62 | Ht 70.0 in | Wt 252.2 lb

## 2024-12-07 DIAGNOSIS — I25118 Atherosclerotic heart disease of native coronary artery with other forms of angina pectoris: Secondary | ICD-10-CM | POA: Diagnosis not present

## 2024-12-07 DIAGNOSIS — E782 Mixed hyperlipidemia: Secondary | ICD-10-CM | POA: Diagnosis not present

## 2024-12-07 DIAGNOSIS — I10 Essential (primary) hypertension: Secondary | ICD-10-CM

## 2024-12-07 DIAGNOSIS — R0609 Other forms of dyspnea: Secondary | ICD-10-CM

## 2024-12-07 NOTE — Assessment & Plan Note (Addendum)
 Treated with atorvastatin .  Lipids followed by PCP.  Goal LDL cholesterol is less than 70 mg/dL.

## 2024-12-07 NOTE — Patient Instructions (Signed)
 Medication Instructions:  No medication changes were made at this visit. Continue current regimen.   *If you need a refill on your cardiac medications before your next appointment, please call your pharmacy*  Lab Work: None ordered today. If you have labs (blood work) drawn today and your tests are completely normal, you will receive your results only by: MyChart Message (if you have MyChart) OR A paper copy in the mail If you have any lab test that is abnormal or we need to change your treatment, we will call you to review the results.  Testing/Procedures: Your physician has requested that you have a lexiscan  myoview . For further information please visit [email or web address] Please follow instruction sheet, as given.  Your physician has requested that you have an echocardiogram. Echocardiography is a painless test that uses sound waves to create images of your heart. It provides your doctor with information about the size and shape of your heart and how well your hearts chambers and valves are working. This procedure takes approximately one hour. There are no restrictions for this procedure. Please do NOT wear cologne, perfume, aftershave, or lotions (deodorant is allowed). Please arrive 15 minutes prior to your appointment time.  Please note: We ask at that you not bring children with you during ultrasound (echo/ vascular) testing. Due to room size and safety concerns, children are not allowed in the ultrasound rooms during exams. Our front office staff cannot provide observation of children in our lobby area while testing is being conducted. An adult accompanying a patient to their appointment will only be allowed in the ultrasound room at the discretion of the ultrasound technician under special circumstances. We apologize for any inconvenience.   Follow-Up: At [facility], you and your health needs are our priority.  As part of our continuing mission to provide you with  exceptional heart care, our providers are all part of one team.  This team includes your primary Cardiologist (physician) and Advanced Practice Providers or APPs (Physician Assistants and Nurse Practitioners) who all work together to provide you with the care you need, when you need it.  Your next appointment:   1 year(s)  Provider:   Ozell Fell, MD    We recommend signing up for the patient portal called MyChart.  Sign up information is provided on this After Visit Summary.  MyChart is used to connect with patients for Virtual Visits (Telemedicine).  Patients are able to view lab/test results, encounter notes, upcoming appointments, etc.  Non-urgent messages can be sent to your provider as well.   To learn more about what you can do with MyChart, go to [email or web address].   Other Instructions Lexiscan  Myoview  (Stress Test) Instructions  Please arrive 15 minutes prior to your appointment time for registration and insurance purposes.   The test will take approximately 3 to 4 hours to complete; you may bring reading material.  If someone comes with you to your appointment, they will need to remain in the main lobby due to limited space in the testing area. **If you are pregnant or breastfeeding, please notify the nuclear lab prior to your appointment**   How to prepare for your Myocardial Perfusion Test: Do not eat or drink 3 hours prior to your test, except you may have water. Do not consume products containing caffeine (regular or decaffeinated) 12 hours prior to your test. (ex: coffee, chocolate, sodas, tea). Do bring a list of your current medications with you.  If not listed below, you may take your medications  as normal. Do wear comfortable clothes (no dresses or overalls) and walking shoes, tennis shoes preferred (No heels or open toe shoes are allowed). Do NOT wear cologne, perfume, aftershave, or lotions (deodorant is allowed). If these instructions are not followed, your  test will have to be rescheduled.   Please report to [address] [place], [place] [address] for your test.  If you have questions or concerns about your appointment, you can call the Nuclear Lab at [phone].   If you cannot keep your appointment, please provide 24 hours notification to the Nuclear Lab, to avoid a possible $50 charge to your account.

## 2024-12-07 NOTE — Progress Notes (Signed)
 " Cardiology Office Note:    Date:  12/07/2024   ID:  Allen James, DOB 1941/10/29, MRN [id number]  PCP:  Rolinda Millman, MD   [facility] Providers Cardiologist:  Ozell Fell, MD     Referring MD: Rolinda Millman, MD   Chief Complaint  Patient presents with   Coronary Artery Disease    History of Present Illness:    Allen James is a 84 y.o. male with a hx of coronary artery disease status post CABG in 2019, atrial flutter status post ablation in 2023, and chronic exertional dyspnea.  The patient is here alone today.  He continues to complain of exertional dyspnea which has been a chronic complaint but he feels that it has slowly gotten worse.  He states that he feels similar to the way he did before bypass surgery.  He denies chest pain or pressure.  He denies orthopnea, PND, or leg swelling.  He has slowed down and has to rest more frequently when doing yard work.  Current Medications: Active Medications[1]   Allergies:   Diphenhydramine and Antihistamines, diphenhydramine-type   ROS:   Please see the history of present illness.    All other systems reviewed and are negative.  EKGs/Labs/Other Studies Reviewed:    The following studies were reviewed today: Cardiac Studies & Procedures   ______________________________________________________________________________________________ CARDIAC CATHETERIZATION  CARDIAC CATHETERIZATION 05/30/2018  Conclusion  The left ventricular systolic function is normal.  LV end diastolic pressure is mildly elevated.  The left ventricular ejection fraction is 55-65% by visual estimate.  Ost LAD lesion is 90% stenosed.  Prox LAD lesion is 50% stenosed.  Mid LAD lesion is 40% stenosed.  2nd Diag lesion is 40% stenosed.  Ost 1st Mrg lesion is 50% stenosed.  Previously placed 1st Mrg stent (unknown type) is widely patent.  Previously placed Prox RCA to Mid RCA stent (unknown type) is widely patent.  Previously  placed Mid RCA to Dist RCA stent (unknown type) is widely patent.  Mid RCA lesion is 50% stenosed.  1.  Severe ostial LAD stenosis with mild to moderate proximal and mid LAD stenosis 2.  Patent left main with no significant stenosis 3.  Patent left circumflex stent (first OM) with moderate stenosis proximal to the stented segment 4.  Patent RCA stents with mild to moderate stenosis just between the stents in the mid RCA 5.  Normal LV function  Recommendations: Recommend cardiac surgical consultation for consideration of CABG.  Patient has diffuse coronary artery disease and severe stenosis involving the true ostium of the LAD.  I think he would benefit from surgical revascularization.  He will be instructed to discontinue clopidogrel  in anticipation of CABG.  Will refer for outpatient surgical consult.    Findings Coronary Findings Diagnostic  Dominance: Right  Left Anterior Descending The LAD is diffusely diseased.  The ostium has 90% stenosis followed by mild to moderate diffuse stenosis in the mid vessel.  The diagonal branches are patent with no high-grade stenoses. Ost LAD lesion is 90% stenosed. True ostial lesion, eccentric stenosis estimated at 90%. Prox LAD lesion is 50% stenosed. Mid LAD lesion is 40% stenosed.  Second Diagonal Branch 2nd Diag lesion is 40% stenosed.  Left Circumflex  First Obtuse Marginal Branch Ost 1st Mrg lesion is 50% stenosed. The lesion is located just proximal to a patent stent in the first OM Previously placed 1st Mrg stent (unknown type) is widely patent.  Right Coronary Artery Previously placed Prox RCA to Mid  RCA stent (unknown type) is widely patent. Mid RCA lesion is 50% stenosed. Previously placed Mid RCA to Dist RCA stent (unknown type) is widely patent.  Intervention  No interventions have been documented.   STRESS TESTS  MYOCARDIAL PERFUSION IMAGING 03/14/2021  Interpretation Summary  The left ventricular ejection fraction is  mildly decreased (45-54%) though imaging performed in atrial flutter.  Nuclear stress EF: 46%.  There was no ST segment deviation noted during stress.  No T wave inversion was noted during stress.  This is an intermediate risk study in the setting of decrease LVEF  IMPRESSIONS Atrial Flutter during exam. Sensitivity for accurate LVEF is decreased in the setting of arrhythmia.  RECOMMENDATIONS/CONCLUSIONS Stress test is negative for ischemic or infarction. The study is at most an intermediate risk study because of LVEF as above. Compared to 2018 study, no change in perfusion.   ECHOCARDIOGRAM  ECHOCARDIOGRAM COMPLETE 03/13/2022  Narrative ECHOCARDIOGRAM REPORT    [name] Date of Exam: 03/13/2022 Medical Rec #:  [id number]        Height:       71.0 in Accession #:    [id number]       Weight:       276.8 lb Date of Birth:  Jul 14, 1941         BSA:          2.421 m Patient Age:    80 years         BP:           116/68 mmHg Patient Gender: M                HR:           67 bpm. Exam Location:  [address]  Procedure: 2D Echo, Cardiac Doppler, Color Doppler and Strain Analysis  Indications:    R06.09 SOB  History:        Patient has prior history of Echocardiogram examinations, most recent 11/24/2019. CAD and NSTEMI, Prior CABG, Arrythmias:Atrial Fibrillation; Risk Factors:Sleep Apnea and HLD, AKI.  Sonographer:    Waldo Guadalajara RCS Referring Phys: [id number] Davy Faught  IMPRESSIONS   1. Left ventricular ejection fraction, by estimation, is 55 to 60%. The left ventricle has normal function. The left ventricle has no regional wall motion abnormalities. There is mild left ventricular hypertrophy. Left ventricular diastolic parameters are consistent with Grade I diastolic dysfunction (impaired relaxation). The average left ventricular global longitudinal strain is -18.8 %. The global longitudinal strain is normal. 2. Right ventricular systolic function  is normal. The right ventricular size is normal. 3. Left atrial size was mildly dilated. 4. The mitral valve is normal in structure. Trivial mitral valve regurgitation. No evidence of mitral stenosis. 5. The aortic valve is tricuspid. Aortic valve regurgitation is mild. Aortic valve sclerosis/calcification is present, without any evidence of aortic stenosis. 6. Aortic dilatation noted. There is borderline dilatation of the aortic root, measuring 39 mm. There is mild dilatation of the ascending aorta, measuring 40 mm.  FINDINGS Left Ventricle: Left ventricular ejection fraction, by estimation, is 55 to 60%. The left ventricle has normal function. The left ventricle has no regional wall motion abnormalities. The average left ventricular global longitudinal strain is -18.8 %. The global longitudinal strain is normal. The left ventricular internal cavity size was normal in size. There is mild left ventricular hypertrophy. Left ventricular diastolic parameters are consistent with Grade I diastolic dysfunction (impaired relaxation).  Right Ventricle: The right ventricular size  is normal. Right ventricular systolic function is normal.  Left Atrium: Left atrial size was mildly dilated.  Right Atrium: Right atrial size was normal in size.  Pericardium: There is no evidence of pericardial effusion.  Mitral Valve: The mitral valve is normal in structure. Mild mitral annular calcification. Trivial mitral valve regurgitation. No evidence of mitral valve stenosis.  Tricuspid Valve: The tricuspid valve is normal in structure. Tricuspid valve regurgitation is trivial. No evidence of tricuspid stenosis.  Aortic Valve: The aortic valve is tricuspid. Aortic valve regurgitation is mild. Aortic regurgitation PHT measures 425 msec. Aortic valve sclerosis/calcification is present, without any evidence of aortic stenosis.  Pulmonic Valve: The pulmonic valve was normal in structure. Pulmonic valve regurgitation is  not visualized. No evidence of pulmonic stenosis.  Aorta: Aortic dilatation noted. There is borderline dilatation of the aortic root, measuring 39 mm. There is mild dilatation of the ascending aorta, measuring 40 mm.  Venous: The inferior vena cava was not well visualized.  IAS/Shunts: The interatrial septum was not well visualized.   LEFT VENTRICLE PLAX 2D LVIDd:         4.60 cm   Diastology LVIDs:         2.90 cm   LV e' medial:    7.72 cm/s LV PW:         1.30 cm   LV E/e' medial:  11.9 LV IVS:        1.20 cm   LV e' lateral:   5.55 cm/s LVOT diam:     1.70 cm   LV E/e' lateral: 16.6 LV SV:         87 LV SV Index:   36        2D Longitudinal Strain LVOT Area:     2.27 cm  2D Strain GLS (A2C):   -15.2 % 2D Strain GLS (A3C):   -18.1 % 2D Strain GLS (A4C):   -23.1 % 2D Strain GLS Avg:     -18.8 %  RIGHT VENTRICLE RV Basal diam:  4.10 cm RV Mid diam:    2.60 cm RV S prime:     7.51 cm/s TAPSE (M-mode): 1.7 cm RVSP:           29.6 mmHg  LEFT ATRIUM             Index        RIGHT ATRIUM           Index LA diam:        5.10 cm 2.11 cm/m   RA Pressure: 3.00 mmHg LA Vol (A2C):   88.4 ml 36.52 ml/m  RA Area:     18.60 cm LA Vol (A4C):   70.4 ml 29.08 ml/m  RA Volume:   51.80 ml  21.40 ml/m LA Biplane Vol: 77.3 ml 31.93 ml/m AORTIC VALVE LVOT Vmax:   155.00 cm/s LVOT Vmean:  114.000 cm/s LVOT VTI:    0.383 m AI PHT:      425 msec  AORTA Ao Root diam: 3.90 cm Ao Asc diam:  4.00 cm  MITRAL VALVE               TRICUSPID VALVE MV Area (PHT):             TR Peak grad:   26.6 mmHg MV Decel Time:             TR Vmax:        258.00 cm/s MV E velocity: 92.20 cm/s  Estimated  RAP:  3.00 mmHg MV A velocity: 84.40 cm/s  RVSP:           29.6 mmHg MV E/A ratio:  1.09 SHUNTS Systemic VTI:  0.38 m Systemic Diam: 1.70 cm  Redell Shallow MD Electronically signed by Redell Shallow MD Signature Date/Time: 03/13/2022/2:00:13 PM    Final   TEE  ECHO TEE  06/06/2018  Interpretation Summary  Septum: No Patent Foramen Ovale present. Lipomatous atrial septal hypertrophy present.  Left atrium: Lipomatous hypertrophy (normal variant) of the interatrial septum is noted. Patent foramen ovale not present.  Aortic valve: Mild valve thickening present. Mild valve calcification present. No stenosis. Mild to moderate regurgitation.  Mitral valve: Mild mitral annular calcification. Mild regurgitation.  Pericardium: Trivial circumferential pericardial effusion.        ______________________________________________________________________________________________      EKG:   EKG Interpretation Date/Time:  Monday December 07 2024 11:05:53 EST Ventricular Rate:  62 PR Interval:  268 QRS Duration:  90 QT Interval:  448 QTC Calculation: 454 R Axis:   12  Text Interpretation: Sinus rhythm with 1st degree A-V block Inferior infarct , age undetermined When compared with ECG of 29-Mar-2021 11:01, Sinus rhythm has replaced Atrial flutter Non-specific change in ST segment in Anterior leads Nonspecific T wave abnormality now evident in Anterior leads Confirmed by Wonda Sharper [phone]) on 12/07/2024 11:22:02 AM    Recent Labs: 10/22/2024: ALT 19; BUN 13; Creatinine, Ser 1.09; Hemoglobin 14.5; Platelets 169; Potassium 4.6; Sodium 141  Recent Lipid Panel    Component Value Date/Time   CHOL 140 06/15/2020 1529   TRIG 156 (H) 06/15/2020 1529   HDL 45 06/15/2020 1529   CHOLHDL 3.1 06/15/2020 1529   CHOLHDL 4.7 05/03/2016 0859   VLDL 29 05/03/2016 0859   LDLCALC 68 06/15/2020 1529     Risk Assessment/Calculations:                Physical Exam:    VS:  BP (!) 110/46 (BP Location: Right Arm, Patient Position: Sitting, Cuff Size: Large)   Pulse 62   Ht 5' 10 (1.778 m)   Wt 252 lb 3.2 oz (114.4 kg)   SpO2 98%   BMI 36.19 kg/m     Wt Readings from Last 3 Encounters:  12/07/24 252 lb 3.2 oz (114.4 kg)  10/22/24 245 lb (111.1 kg)  03/27/23  273 lb (123.8 kg)     GEN:  Well nourished, well developed in no acute distress HEENT: Normal NECK: No JVD; No carotid bruits LYMPHATICS: No lymphadenopathy CARDIAC: RRR, no murmurs, rubs, gallops RESPIRATORY:  Clear to auscultation without rales, wheezing or rhonchi  ABDOMEN: Soft, non-tender, non-distended MUSCULOSKELETAL:  No edema; No deformity  SKIN: Warm and dry NEUROLOGIC:  Alert and oriented x 3 PSYCHIATRIC:  Normal affect   Assessment & Plan DOE (dyspnea on exertion) Concerned that this could be an anginal equivalent.  He states that symptoms are similar to what he felt before coronary bypass surgery.  Last stress test was in 2022.  Last echo was in 2023.  I think both of these should be updated and I have recommended a 2D echocardiogram as well as a Lexiscan  Myoview  stress study.  Otherwise we will continue his current medical program.  His blood pressure appears well-controlled and there are no signs of volume overload. Coronary artery disease involving native coronary artery of native heart with other form of angina pectoris Continue aspirin  and atorvastatin  at current doses.  Continue metoprolol  succinate.  Check Lexiscan  Myoview  as  above. Essential hypertension Blood pressure well-controlled on a combination of amlodipine  and metoprolol  succinate Mixed hyperlipidemia Treated with atorvastatin .  Lipids followed by PCP.  Goal LDL cholesterol is less than 70 mg/dL.      Informed Consent   Shared Decision Making/Informed Consent The risks [chest pain, shortness of breath, cardiac arrhythmias, dizziness, blood pressure fluctuations, myocardial infarction, stroke/transient ischemic attack, nausea, vomiting, allergic reaction, radiation exposure, metallic taste sensation and life-threatening complications (estimated to be 1 in 10,000)], benefits (risk stratification, diagnosing coronary artery disease, treatment guidance) and alternatives of a nuclear stress test were discussed in  detail with Allen James and he agrees to proceed.       Medication Adjustments/Labs and Tests Ordered: Current medicines are reviewed at length with the patient today.  Concerns regarding medicines are outlined above.  Orders Placed This Encounter  Procedures   Cardiac Stress Test: Informed Consent Details: Physician/Practitioner Attestation; Transcribe to consent form and obtain patient signature   MYOCARDIAL PERFUSION IMAGING   EKG 12-Lead   ECHOCARDIOGRAM COMPLETE   No orders of the defined types were placed in this encounter.   Patient Instructions  Medication Instructions:  No medication changes were made at this visit. Continue current regimen.   *If you need a refill on your cardiac medications before your next appointment, please call your pharmacy*  Lab Work: None ordered today. If you have labs (blood work) drawn today and your tests are completely normal, you will receive your results only by: MyChart Message (if you have MyChart) OR A paper copy in the mail If you have any lab test that is abnormal or we need to change your treatment, we will call you to review the results.  Testing/Procedures: Your physician has requested that you have a lexiscan  myoview . For further information please visit [email or web address] Please follow instruction sheet, as given.  Your physician has requested that you have an echocardiogram. Echocardiography is a painless test that uses sound waves to create images of your heart. It provides your doctor with information about the size and shape of your heart and how well your hearts chambers and valves are working. This procedure takes approximately one hour. There are no restrictions for this procedure. Please do NOT wear cologne, perfume, aftershave, or lotions (deodorant is allowed). Please arrive 15 minutes prior to your appointment time.  Please note: We ask at that you not bring children with you during ultrasound (echo/ vascular)  testing. Due to room size and safety concerns, children are not allowed in the ultrasound rooms during exams. Our front office staff cannot provide observation of children in our lobby area while testing is being conducted. An adult accompanying a patient to their appointment will only be allowed in the ultrasound room at the discretion of the ultrasound technician under special circumstances. We apologize for any inconvenience.   Follow-Up: At [facility], you and your health needs are our priority.  As part of our continuing mission to provide you with exceptional heart care, our providers are all part of one team.  This team includes your primary Cardiologist (physician) and Advanced Practice Providers or APPs (Physician Assistants and Nurse Practitioners) who all work together to provide you with the care you need, when you need it.  Your next appointment:   1 year(s)  Provider:   Ozell Fell, MD    We recommend signing up for the patient portal called MyChart.  Sign up information is provided on this After Visit Summary.  MyChart is used to  connect with patients for Virtual Visits (Telemedicine).  Patients are able to view lab/test results, encounter notes, upcoming appointments, etc.  Non-urgent messages can be sent to your provider as well.   To learn more about what you can do with MyChart, go to [email or web address].   Other Instructions Lexiscan  Myoview  (Stress Test) Instructions  Please arrive 15 minutes prior to your appointment time for registration and insurance purposes.   The test will take approximately 3 to 4 hours to complete; you may bring reading material.  If someone comes with you to your appointment, they will need to remain in the main lobby due to limited space in the testing area. **If you are pregnant or breastfeeding, please notify the nuclear lab prior to your appointment**   How to prepare for your Myocardial Perfusion Test: Do not eat or drink 3  hours prior to your test, except you may have water. Do not consume products containing caffeine (regular or decaffeinated) 12 hours prior to your test. (ex: coffee, chocolate, sodas, tea). Do bring a list of your current medications with you.  If not listed below, you may take your medications as normal. Do wear comfortable clothes (no dresses or overalls) and walking shoes, tennis shoes preferred (No heels or open toe shoes are allowed). Do NOT wear cologne, perfume, aftershave, or lotions (deodorant is allowed). If these instructions are not followed, your test will have to be rescheduled.   Please report to [address], [place], [place] [address] for your test.  If you have questions or concerns about your appointment, you can call the Nuclear Lab at [phone].   If you cannot keep your appointment, please provide 24 hours notification to the Nuclear Lab, to avoid a possible $50 charge to your account.     Signed, Ozell Fell, MD  12/07/2024 1:41 PM    [facility]     [1]  Current Meds  Medication Sig   Alpha-D-Galactosidase (BEANO ULTRA 800) TABS Take 2 tablets by mouth See admin instructions. Swallow 2 tablets right before the first bite or immediately after the meal (up to 30 minutes after the first bite)   amLODipine  (NORVASC ) 5 MG tablet Take 5 mg by mouth daily.   aspirin  EC 81 MG tablet Take 1 tablet (81 mg total) by mouth daily. Swallow whole.   atorvastatin  (LIPITOR ) 40 MG tablet Take 1 tablet (40 mg total) by mouth daily at 6 PM. (Patient taking differently: Take 40 mg by mouth daily.)   furosemide  (LASIX ) 20 MG tablet Take 1 tablet by mouth daily for 3 days then only as needed for swelling   HYDROcodone -acetaminophen  (NORCO) 10-325 MG tablet Take 0.5 tablets by mouth 2 (two) times daily as needed (for pain).   meloxicam (MOBIC) 15 MG tablet Take 15 mg by mouth in the morning.   metoprolol  succinate (TOPROL -XL) 50 MG 24 hr tablet Take 1 tablet in the AM and  1/2 tablet in the PM (Patient taking differently: Take 50 mg by mouth daily.)   pantoprazole  (PROTONIX ) 40 MG tablet Take 1 tablet (40 mg total) by mouth daily.   polyethylene glycol powder (GLYCOLAX /MIRALAX ) 17 GM/SCOOP powder Take 17 g by mouth daily. Dissolve 1 capful (17g) in 4-8 ounces of liquid and take by mouth daily.   Pramoxine HCl (TUCKS HEMORRHOIDAL RE) Place 1 application  rectally See admin instructions. Tucks Triple Relief Hemorrhoidal Cream - Apply to hemorrhoids (but NOT INSIDE OF the rectum) up to 6 times a day as needed for pain  or swelling (Patient taking differently: Place 1 application  rectally as needed. Tucks Triple Relief Hemorrhoidal Cream - Apply to hemorrhoids (but NOT INSIDE OF the rectum) up to 6 times a day as needed for pain or swelling)   traMADol  (ULTRAM ) 50 MG tablet Take 2 tablets (100 mg total) by mouth 2 (two) times daily. (Patient taking differently: Take 50-100 mg by mouth 2 (two) times daily as needed (for pain).)   TUMS 500 MG chewable tablet Chew 1 tablet by mouth 2 (two) times daily as needed for indigestion or heartburn.   "

## 2024-12-07 NOTE — Assessment & Plan Note (Addendum)
 Continue aspirin  and atorvastatin  at current doses.  Continue metoprolol  succinate.  Check Lexiscan  Myoview  as above.

## 2024-12-10 ENCOUNTER — Other Ambulatory Visit: Payer: Self-pay | Admitting: Cardiovascular Disease

## 2024-12-10 ENCOUNTER — Telehealth (HOSPITAL_COMMUNITY): Payer: Self-pay | Admitting: *Deleted

## 2024-12-10 DIAGNOSIS — R0609 Other forms of dyspnea: Secondary | ICD-10-CM

## 2024-12-10 DIAGNOSIS — I25118 Atherosclerotic heart disease of native coronary artery with other forms of angina pectoris: Secondary | ICD-10-CM

## 2024-12-10 NOTE — Telephone Encounter (Signed)

## 2024-12-14 ENCOUNTER — Ambulatory Visit (HOSPITAL_COMMUNITY)
Admission: RE | Admit: 2024-12-14 | Discharge: 2024-12-14 | Disposition: A | Source: Ambulatory Visit | Attending: Cardiovascular Disease | Admitting: Cardiovascular Disease

## 2024-12-14 DIAGNOSIS — I25118 Atherosclerotic heart disease of native coronary artery with other forms of angina pectoris: Secondary | ICD-10-CM | POA: Insufficient documentation

## 2024-12-14 DIAGNOSIS — R0609 Other forms of dyspnea: Secondary | ICD-10-CM | POA: Insufficient documentation

## 2024-12-14 LAB — MYOCARDIAL PERFUSION IMAGING
LV dias vol: 130 mL (ref 62–150)
LV sys vol: 40 mL
Nuc Stress EF: 69 %
Peak HR: 68 {beats}/min
Rest HR: 60 {beats}/min
Rest Nuclear Isotope Dose: 10.9 mCi
SDS: 0
SRS: 0
SSS: 0
ST Depression (mm): 0 mm
Stress Nuclear Isotope Dose: 32.4 mCi
TID: 1.04

## 2024-12-14 MED ORDER — REGADENOSON 0.4 MG/5ML IV SOLN
0.4000 mg | Freq: Once | INTRAVENOUS | Status: AC
  Administered 2024-12-14: 0.4 mg via INTRAVENOUS

## 2024-12-14 MED ORDER — REGADENOSON 0.4 MG/5ML IV SOLN
INTRAVENOUS | Status: AC
  Filled 2024-12-14: qty 5

## 2024-12-14 MED ORDER — TECHNETIUM TC 99M TETROFOSMIN IV KIT
32.4000 | PACK | Freq: Once | INTRAVENOUS | Status: AC | PRN
  Administered 2024-12-14: 32.4 via INTRAVENOUS

## 2024-12-14 MED ORDER — TECHNETIUM TC 99M TETROFOSMIN IV KIT
10.9000 | PACK | Freq: Once | INTRAVENOUS | Status: AC | PRN
  Administered 2024-12-14: 10.9 via INTRAVENOUS

## 2024-12-15 ENCOUNTER — Ambulatory Visit: Payer: Self-pay | Admitting: Cardiovascular Disease

## 2025-01-11 ENCOUNTER — Ambulatory Visit (HOSPITAL_COMMUNITY)

## 2025-01-22 ENCOUNTER — Ambulatory Visit: Admitting: Cardiovascular Disease
